# Patient Record
Sex: Female | Born: 1940 | Race: Black or African American | Hispanic: No | Smoking: Former smoker
Health system: Southern US, Community
[De-identification: ages and names within clinical notes are randomized; demographics above are authoritative.]

## PROBLEM LIST (undated history)

## (undated) DIAGNOSIS — I719 Aortic aneurysm of unspecified site, without rupture: Secondary | ICD-10-CM

## (undated) DIAGNOSIS — J189 Pneumonia, unspecified organism: Secondary | ICD-10-CM

## (undated) DIAGNOSIS — I10 Essential (primary) hypertension: Secondary | ICD-10-CM

## (undated) DIAGNOSIS — N1831 Chronic kidney disease, stage 3a: Secondary | ICD-10-CM

## (undated) DIAGNOSIS — D649 Anemia, unspecified: Secondary | ICD-10-CM

## (undated) DIAGNOSIS — I771 Stricture of artery: Secondary | ICD-10-CM

## (undated) DIAGNOSIS — I340 Nonrheumatic mitral (valve) insufficiency: Secondary | ICD-10-CM

## (undated) DIAGNOSIS — Z9289 Personal history of other medical treatment: Secondary | ICD-10-CM

## (undated) DIAGNOSIS — I351 Nonrheumatic aortic (valve) insufficiency: Secondary | ICD-10-CM

## (undated) DIAGNOSIS — E7800 Pure hypercholesterolemia, unspecified: Secondary | ICD-10-CM

## (undated) DIAGNOSIS — I429 Cardiomyopathy, unspecified: Secondary | ICD-10-CM

## (undated) DIAGNOSIS — C50912 Malignant neoplasm of unspecified site of left female breast: Secondary | ICD-10-CM

## (undated) DIAGNOSIS — Z9221 Personal history of antineoplastic chemotherapy: Secondary | ICD-10-CM

## (undated) DIAGNOSIS — E119 Type 2 diabetes mellitus without complications: Secondary | ICD-10-CM

## (undated) DIAGNOSIS — I774 Celiac artery compression syndrome: Secondary | ICD-10-CM

## (undated) HISTORY — PX: TUBAL LIGATION: SHX77

## (undated) HISTORY — DX: Aortic aneurysm of unspecified site, without rupture: I71.9

## (undated) HISTORY — PX: NASAL SINUS SURGERY: SHX719

## (undated) HISTORY — PX: TONSILLECTOMY: SUR1361

## (undated) HISTORY — PX: LUMBAR MICRODISCECTOMY: SHX99

## (undated) HISTORY — PX: BACK SURGERY: SHX140

---

## 2001-05-26 HISTORY — PX: MASTECTOMY: SHX3

## 2001-05-26 HISTORY — PX: BREAST BIOPSY: SHX20

## 2002-05-26 DIAGNOSIS — Z9221 Personal history of antineoplastic chemotherapy: Secondary | ICD-10-CM

## 2002-05-26 HISTORY — DX: Personal history of antineoplastic chemotherapy: Z92.21

## 2010-02-05 ENCOUNTER — Encounter: Admission: RE | Admit: 2010-02-05 | Discharge: 2010-02-05 | Payer: Self-pay | Admitting: Emergency Medicine

## 2010-12-30 ENCOUNTER — Other Ambulatory Visit: Payer: Self-pay | Admitting: Emergency Medicine

## 2010-12-30 DIAGNOSIS — Z1231 Encounter for screening mammogram for malignant neoplasm of breast: Secondary | ICD-10-CM

## 2011-02-10 ENCOUNTER — Ambulatory Visit
Admission: RE | Admit: 2011-02-10 | Discharge: 2011-02-10 | Disposition: A | Discharge status: Home / Self Care | Payer: Medicare Other | Source: Ambulatory Visit | Attending: Emergency Medicine | Admitting: Emergency Medicine

## 2011-02-10 DIAGNOSIS — Z1231 Encounter for screening mammogram for malignant neoplasm of breast: Secondary | ICD-10-CM

## 2011-02-14 ENCOUNTER — Other Ambulatory Visit: Payer: Self-pay | Admitting: Emergency Medicine

## 2011-02-14 DIAGNOSIS — R928 Other abnormal and inconclusive findings on diagnostic imaging of breast: Secondary | ICD-10-CM

## 2011-02-26 ENCOUNTER — Ambulatory Visit
Admission: RE | Admit: 2011-02-26 | Discharge: 2011-02-26 | Disposition: A | Discharge status: Home / Self Care | Payer: Medicare Other | Source: Ambulatory Visit | Attending: Emergency Medicine | Admitting: Emergency Medicine

## 2011-02-26 ENCOUNTER — Ambulatory Visit
Admission: RE | Admit: 2011-02-26 | Discharge: 2011-02-26 | Disposition: A | Discharge status: Home / Self Care | Payer: PRIVATE HEALTH INSURANCE | Source: Ambulatory Visit | Attending: Emergency Medicine | Admitting: Emergency Medicine

## 2011-02-26 DIAGNOSIS — R928 Other abnormal and inconclusive findings on diagnostic imaging of breast: Secondary | ICD-10-CM

## 2012-01-21 ENCOUNTER — Other Ambulatory Visit: Payer: Self-pay | Admitting: Family Medicine

## 2012-01-21 DIAGNOSIS — Z1231 Encounter for screening mammogram for malignant neoplasm of breast: Secondary | ICD-10-CM

## 2012-02-16 ENCOUNTER — Other Ambulatory Visit: Payer: Self-pay | Admitting: Family Medicine

## 2012-02-16 ENCOUNTER — Ambulatory Visit: Payer: Medicare Other

## 2012-02-16 ENCOUNTER — Ambulatory Visit
Admission: RE | Admit: 2012-02-16 | Discharge: 2012-02-16 | Disposition: A | Discharge status: Home / Self Care | Payer: Medicare Other | Source: Ambulatory Visit | Attending: Family Medicine | Admitting: Family Medicine

## 2012-02-16 DIAGNOSIS — Z1231 Encounter for screening mammogram for malignant neoplasm of breast: Secondary | ICD-10-CM

## 2013-01-26 ENCOUNTER — Other Ambulatory Visit: Payer: Self-pay

## 2013-01-26 ENCOUNTER — Other Ambulatory Visit: Payer: Self-pay | Admitting: *Deleted

## 2013-01-26 DIAGNOSIS — Z9012 Acquired absence of left breast and nipple: Secondary | ICD-10-CM

## 2013-01-26 DIAGNOSIS — N951 Menopausal and female climacteric states: Secondary | ICD-10-CM

## 2013-01-26 DIAGNOSIS — Z853 Personal history of malignant neoplasm of breast: Secondary | ICD-10-CM

## 2013-01-26 DIAGNOSIS — Z1231 Encounter for screening mammogram for malignant neoplasm of breast: Secondary | ICD-10-CM

## 2013-02-18 ENCOUNTER — Ambulatory Visit: Payer: Medicare Other

## 2013-02-18 ENCOUNTER — Other Ambulatory Visit: Payer: Medicare Other

## 2013-02-23 ENCOUNTER — Ambulatory Visit
Admission: RE | Admit: 2013-02-23 | Discharge: 2013-02-23 | Disposition: A | Discharge status: Home / Self Care | Payer: Medicare Other | Source: Ambulatory Visit | Attending: *Deleted | Admitting: *Deleted

## 2013-02-23 ENCOUNTER — Ambulatory Visit
Admission: RE | Admit: 2013-02-23 | Discharge: 2013-02-23 | Disposition: A | Discharge status: Home / Self Care | Payer: Medicare Other | Source: Ambulatory Visit

## 2013-02-23 DIAGNOSIS — N951 Menopausal and female climacteric states: Secondary | ICD-10-CM

## 2013-02-23 DIAGNOSIS — Z853 Personal history of malignant neoplasm of breast: Secondary | ICD-10-CM

## 2013-02-23 DIAGNOSIS — Z1231 Encounter for screening mammogram for malignant neoplasm of breast: Secondary | ICD-10-CM

## 2013-02-23 DIAGNOSIS — Z9012 Acquired absence of left breast and nipple: Secondary | ICD-10-CM

## 2014-01-13 ENCOUNTER — Other Ambulatory Visit: Payer: Self-pay

## 2014-01-13 DIAGNOSIS — Z1231 Encounter for screening mammogram for malignant neoplasm of breast: Secondary | ICD-10-CM

## 2014-01-13 DIAGNOSIS — Z9012 Acquired absence of left breast and nipple: Secondary | ICD-10-CM

## 2014-02-27 ENCOUNTER — Ambulatory Visit
Admission: RE | Admit: 2014-02-27 | Discharge: 2014-02-27 | Disposition: A | Discharge status: Home / Self Care | Payer: Medicare Other | Source: Ambulatory Visit

## 2014-02-27 DIAGNOSIS — Z1231 Encounter for screening mammogram for malignant neoplasm of breast: Secondary | ICD-10-CM

## 2014-02-27 DIAGNOSIS — Z9012 Acquired absence of left breast and nipple: Secondary | ICD-10-CM

## 2014-11-25 ENCOUNTER — Emergency Department (HOSPITAL_COMMUNITY)
Admission: EM | Admit: 2014-11-25 | Discharge: 2014-11-25 | Disposition: A | Discharge status: Home / Self Care | Payer: Medicare Other | Attending: Emergency Medicine | Admitting: Emergency Medicine

## 2014-11-25 ENCOUNTER — Emergency Department (HOSPITAL_COMMUNITY): Payer: Medicare Other

## 2014-11-25 ENCOUNTER — Encounter (HOSPITAL_COMMUNITY): Payer: Self-pay | Admitting: Emergency Medicine

## 2014-11-25 ENCOUNTER — Telehealth: Payer: Self-pay | Admitting: *Deleted

## 2014-11-25 DIAGNOSIS — Z79899 Other long term (current) drug therapy: Secondary | ICD-10-CM | POA: Insufficient documentation

## 2014-11-25 DIAGNOSIS — D649 Anemia, unspecified: Secondary | ICD-10-CM | POA: Insufficient documentation

## 2014-11-25 DIAGNOSIS — I10 Essential (primary) hypertension: Secondary | ICD-10-CM | POA: Diagnosis not present

## 2014-11-25 DIAGNOSIS — M546 Pain in thoracic spine: Secondary | ICD-10-CM | POA: Diagnosis present

## 2014-11-25 DIAGNOSIS — R109 Unspecified abdominal pain: Secondary | ICD-10-CM | POA: Diagnosis not present

## 2014-11-25 DIAGNOSIS — I714 Abdominal aortic aneurysm, without rupture, unspecified: Secondary | ICD-10-CM

## 2014-11-25 DIAGNOSIS — Z87891 Personal history of nicotine dependence: Secondary | ICD-10-CM | POA: Insufficient documentation

## 2014-11-25 DIAGNOSIS — E119 Type 2 diabetes mellitus without complications: Secondary | ICD-10-CM | POA: Insufficient documentation

## 2014-11-25 DIAGNOSIS — M545 Low back pain: Secondary | ICD-10-CM | POA: Insufficient documentation

## 2014-11-25 DIAGNOSIS — R112 Nausea with vomiting, unspecified: Secondary | ICD-10-CM | POA: Insufficient documentation

## 2014-11-25 DIAGNOSIS — Z859 Personal history of malignant neoplasm, unspecified: Secondary | ICD-10-CM | POA: Insufficient documentation

## 2014-11-25 HISTORY — DX: Essential (primary) hypertension: I10

## 2014-11-25 LAB — COMPREHENSIVE METABOLIC PANEL
ALT: 13 U/L — ABNORMAL LOW (ref 14–54)
ANION GAP: 11 (ref 5–15)
AST: 22 U/L (ref 15–41)
Albumin: 3.6 g/dL (ref 3.5–5.0)
Alkaline Phosphatase: 70 U/L (ref 38–126)
BILIRUBIN TOTAL: 0.5 mg/dL (ref 0.3–1.2)
BUN: 20 mg/dL (ref 6–20)
CO2: 26 mmol/L (ref 22–32)
CREATININE: 1.03 mg/dL — AB (ref 0.44–1.00)
Calcium: 8.9 mg/dL (ref 8.9–10.3)
Chloride: 101 mmol/L (ref 101–111)
GFR calc Af Amer: >60 mL/min (ref >60)
GFR, EST NON AFRICAN AMERICAN: 52 mL/min — AB (ref >60)
GLUCOSE: 140 mg/dL — AB (ref 65–99)
POTASSIUM: 3.4 mmol/L — AB (ref 3.5–5.1)
Sodium: 138 mmol/L (ref 135–145)
Total Protein: 7.3 g/dL (ref 6.5–8.1)

## 2014-11-25 LAB — CBC WITH DIFFERENTIAL/PLATELET
BASOS PCT: 0 % (ref 0–1)
Basophils Absolute: 0 10*3/uL (ref 0.0–0.1)
EOS PCT: 0 % (ref 0–5)
Eosinophils Absolute: 0 10*3/uL (ref 0.0–0.7)
HEMATOCRIT: 30.4 % — AB (ref 36.0–46.0)
Hemoglobin: 9.9 g/dL — ABNORMAL LOW (ref 12.0–15.0)
Lymphocytes Relative: 11 % — ABNORMAL LOW (ref 12–46)
Lymphs Abs: 1.1 10*3/uL (ref 0.7–4.0)
MCH: 26.1 pg (ref 26.0–34.0)
MCHC: 32.6 g/dL (ref 30.0–36.0)
MCV: 80.2 fL (ref 78.0–100.0)
MONO ABS: 0.5 10*3/uL (ref 0.1–1.0)
MONOS PCT: 5 % (ref 3–12)
NEUTROS ABS: 8.3 10*3/uL — AB (ref 1.7–7.7)
NEUTROS PCT: 84 % — AB (ref 43–77)
Platelets: 225 10*3/uL (ref 150–400)
RBC: 3.79 MIL/uL — ABNORMAL LOW (ref 3.87–5.11)
RDW: 14.5 % (ref 11.5–15.5)
WBC: 10 10*3/uL (ref 4.0–10.5)

## 2014-11-25 LAB — TROPONIN I: Troponin I: <0.03 ng/mL (ref <0.031)

## 2014-11-25 LAB — I-STAT CG4 LACTIC ACID, ED: Lactic Acid, Venous: 0.96 mmol/L (ref 0.5–2.0)

## 2014-11-25 LAB — LIPASE, BLOOD: Lipase: 19 U/L — ABNORMAL LOW (ref 22–51)

## 2014-11-25 MED ORDER — DIPHENHYDRAMINE HCL 50 MG/ML IJ SOLN
25.0000 mg | Freq: Once | INTRAMUSCULAR | Status: AC
  Administered 2014-11-25: 25 mg via INTRAVENOUS
  Filled 2014-11-25: qty 1

## 2014-11-25 MED ORDER — MORPHINE SULFATE 4 MG/ML IJ SOLN
4.0000 mg | Freq: Once | INTRAMUSCULAR | Status: AC
  Administered 2014-11-25: 4 mg via INTRAVENOUS
  Filled 2014-11-25: qty 1

## 2014-11-25 MED ORDER — ONDANSETRON HCL 4 MG PO TABS: 4.0000 mg | ORAL_TABLET | Freq: Four times a day (QID) | ORAL | Status: DC | PRN

## 2014-11-25 MED ORDER — ONDANSETRON HCL 4 MG/2ML IJ SOLN
4.0000 mg | Freq: Once | INTRAMUSCULAR | Status: AC
  Administered 2014-11-25: 4 mg via INTRAVENOUS
  Filled 2014-11-25: qty 2

## 2014-11-25 MED ORDER — IOHEXOL 300 MG/ML  SOLN: 25.0000 mL | Freq: Once | INTRAMUSCULAR | Status: DC | PRN

## 2014-11-25 MED ORDER — METOCLOPRAMIDE HCL 5 MG/ML IJ SOLN
10.0000 mg | Freq: Once | INTRAMUSCULAR | Status: AC
  Administered 2014-11-25: 10 mg via INTRAVENOUS
  Filled 2014-11-25: qty 2

## 2014-11-25 MED ORDER — OXYCODONE-ACETAMINOPHEN 5-325 MG PO TABS: 1.0000 | ORAL_TABLET | ORAL | Status: DC | PRN

## 2014-11-25 MED ORDER — IOHEXOL 300 MG/ML  SOLN
100.0000 mL | Freq: Once | INTRAMUSCULAR | Status: AC | PRN
  Administered 2014-11-25: 100 mL via INTRAVENOUS

## 2014-11-25 NOTE — Discharge Instructions (Signed)
Return if symptoms are getting worse.  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 This needs to be watched closely. If it gets bigger than 55 mm, then the risk of it rupturing gets much higher.  Abdominal Pain Many things can cause abdominal pain. Usually, abdominal pain is not caused by a disease and will improve without treatment. It can often be observed and treated at home. Your health care provider will do a physical exam and possibly order blood tests and X-rays to help determine the seriousness of your pain. However, in many cases, more time must pass before a clear cause of the pain can be found. Before that point, your health care provider may not know if you need more testing or further treatment. HOME CARE INSTRUCTIONS  Monitor your abdominal pain for any changes. The following actions may help to alleviate any discomfort you are experiencing:  Only take over-the-counter or prescription medicines as directed by your health care provider.  Do not take laxatives unless directed to do so by your health care provider.  Try a clear liquid diet (broth, tea, or water) as directed by your health care provider. Slowly move to a bland diet as tolerated. SEEK MEDICAL CARE IF:  You have unexplained abdominal pain.  You have abdominal pain associated with nausea or diarrhea.  You have pain when you urinate or have a bowel movement.  You experience abdominal pain that wakes you in the night.  You have abdominal pain that is worsened or improved by eating food.  You have abdominal pain that is worsened with eating fatty foods.  You have a fever. SEEK IMMEDIATE MEDICAL CARE IF:   Your pain does not go away within 2 hours.  You keep throwing up (vomiting).  Your pain is felt only in portions of the abdomen, such as the right side or the left lower portion of the abdomen.  You pass bloody or black tarry stools. MAKE SURE YOU:  Understand these instructions.   Will watch your  condition.   Will get help right away if you are not doing well or get worse.  Document Released: 02/19/2005 Document Revised: 05/17/2013 Document Reviewed: 01/19/2013 [facility] Patient Information 2015 [place], [place]. This information is not intended to replace advice given to you by your health care provider. Make sure you discuss any questions you have with your health care provider.   Abdominal Aortic Aneurysm An aneurysm is a weakened or damaged part of an artery wall that bulges from the normal force of blood pumping through the body. An abdominal aortic aneurysm is an aneurysm that occurs in the lower part of the aorta, the main artery of the body.  The major concern with an abdominal aortic aneurysm is that it can enlarge and burst (rupture) or blood can flow between the layers of the wall of the aorta through a tear (aorticdissection). Both of these conditions can cause bleeding inside the body and can be life threatening unless diagnosed and treated promptly. CAUSES  The exact cause of an abdominal aortic aneurysm is unknown. Some contributing factors are:   A hardening of the arteries caused by the buildup of fat and other substances in the lining of a blood vessel (arteriosclerosis).  Inflammation of the walls of an artery (arteritis).   Connective tissue diseases, such as Marfan syndrome.   Abdominal trauma.   An infection, such as syphilis or staphylococcus, in the wall of the aorta (infectious aortitis) caused by bacteria. RISK FACTORS  Risk factors that contribute to an abdominal aortic aneurysm may include:  Age older than 61 years.   High blood pressure (hypertension).  Female gender.  Ethnicity (white race).  Obesity.  Family history of aneurysm (first degree relatives only).  Tobacco use. PREVENTION  The following healthy lifestyle habits may help decrease your risk of abdominal aortic aneurysm:  Quitting smoking. Smoking can raise your blood pressure  and cause arteriosclerosis.  Limiting or avoiding alcohol.  Keeping your blood pressure, blood sugar level, and cholesterol levels within normal limits.  Decreasing your salt intake. In somepeople, too much salt can raise blood pressure and increase your risk of abdominal aortic aneurysm.  Eating a diet low in saturated fats and cholesterol.  Increasing your fiber intake by including whole grains, vegetables, and fruits in your diet. Eating these foods may help lower blood pressure.  Maintaining a healthy weight.  Staying physically active and exercising regularly. SYMPTOMS  The symptoms of abdominal aortic aneurysm may vary depending on the size and rate of growth of the aneurysm.Most grow slowly and do not have any symptoms. When symptoms do occur, they may include:  Pain (abdomen, side, lower back, or groin). The pain may vary in intensity. A sudden onset of severe pain may indicate that the aneurysm has ruptured.  Feeling full after eating only small amounts of food.  Nausea or vomiting or both.  Feeling a pulsating lump in the abdomen.  Feeling faint or passing out. DIAGNOSIS  Since most unruptured abdominal aortic aneurysms have no symptoms, they are often discovered during diagnostic exams for other conditions. An aneurysm may be found during the following procedures:  Ultrasonography (A one-time screening for abdominal aortic aneurysm by ultrasonography is also recommended for all men aged 66-75 years who have ever smoked).  X-ray exams.  A computed tomography (CT).  Magnetic resonance imaging (MRI).  Angiography or arteriography. TREATMENT  Treatment of an abdominal aortic aneurysm depends on the size of your aneurysm, your age, and risk factors for rupture. Medication to control blood pressure and pain may be used to manage aneurysms smaller than 6 cm. Regular monitoring for enlargement may be recommended by your caregiver if:  The aneurysm is 3-4 cm in size (an  annual ultrasonography may be recommended).  The aneurysm is 4-4.5 cm in size (an ultrasonography every 6 months may be recommended).  The aneurysm is larger than 4.5 cm in size (your caregiver may ask that you be examined by a vascular surgeon). If your aneurysm is larger than 6 cm, surgical repair may be recommended. There are two main methods for repair of an aneurysm:   Endovascular repair (a minimally invasive surgery). This is done most often.  Open repair. This method is used if an endovascular repair is not possible. Document Released: 02/19/2005 Document Revised: 09/06/2012 Document Reviewed: 06/11/2012 [facility] Patient Information 2015 [place], [place]. This information is not intended to replace advice given to you by your health care provider. Make sure you discuss any questions you have with your health care provider.  Acetaminophen; Oxycodone tablets What is this medicine? ACETAMINOPHEN; OXYCODONE (a set a MEE noe fen; ox i KOE done) is a pain reliever. It is used to treat mild to moderate pain. This medicine may be used for other purposes; ask your health care provider or pharmacist if you have questions. COMMON BRAND NAME(S): Endocet, Magnacet, Narvox, Percocet, Perloxx, Primalev, Primlev, Roxicet, Xolox What should I tell my health care provider before I take this medicine? They need to  know if you have any of these conditions: -brain tumor -Crohn's disease, inflammatory bowel disease, or ulcerative colitis -drug abuse or addiction -head injury -heart or circulation problems -if you often drink alcohol -kidney disease or problems going to the bathroom -liver disease -lung disease, asthma, or breathing problems -an unusual or allergic reaction to acetaminophen, oxycodone, other opioid analgesics, other medicines, foods, dyes, or preservatives -pregnant or trying to get pregnant -breast-feeding How should I use this medicine? Take this medicine by mouth with a full  glass of water. Follow the directions on the prescription label. Take your medicine at regular intervals. Do not take your medicine more often than directed. Talk to your pediatrician regarding the use of this medicine in children. Special care may be needed. Patients over 13 years old may have a stronger reaction and need a smaller dose. Overdosage: If you think you have taken too much of this medicine contact a poison control center or emergency room at once. NOTE: This medicine is only for you. Do not share this medicine with others. What if I miss a dose? If you miss a dose, take it as soon as you can. If it is almost time for your next dose, take only that dose. Do not take double or extra doses. What may interact with this medicine? -alcohol -antihistamines -barbiturates like amobarbital, butalbital, butabarbital, methohexital, pentobarbital, phenobarbital, thiopental, and secobarbital -benztropine -drugs for bladder problems like solifenacin, trospium, oxybutynin, tolterodine, hyoscyamine, and methscopolamine -drugs for breathing problems like ipratropium and tiotropium -drugs for certain stomach or intestine problems like propantheline, homatropine methylbromide, glycopyrrolate, atropine, belladonna, and dicyclomine -general anesthetics like etomidate, ketamine, nitrous oxide, propofol, desflurane, enflurane, halothane, isoflurane, and sevoflurane -medicines for depression, anxiety, or psychotic disturbances -medicines for sleep -muscle relaxants -naltrexone -narcotic medicines (opiates) for pain -phenothiazines like perphenazine, thioridazine, chlorpromazine, mesoridazine, fluphenazine, prochlorperazine, promazine, and trifluoperazine -scopolamine -tramadol -trihexyphenidyl This list may not describe all possible interactions. Give your health care provider a list of all the medicines, herbs, non-prescription drugs, or dietary supplements you use. Also tell them if you smoke, drink  alcohol, or use illegal drugs. Some items may interact with your medicine. What should I watch for while using this medicine? Tell your doctor or health care professional if your pain does not go away, if it gets worse, or if you have new or a different type of pain. You may develop tolerance to the medicine. Tolerance means that you will need a higher dose of the medication for pain relief. Tolerance is normal and is expected if you take this medicine for a long time. Do not suddenly stop taking your medicine because you may develop a severe reaction. Your body becomes used to the medicine. This does NOT mean you are addicted. Addiction is a behavior related to getting and using a drug for a non-medical reason. If you have pain, you have a medical reason to take pain medicine. Your doctor will tell you how much medicine to take. If your doctor wants you to stop the medicine, the dose will be slowly lowered over time to avoid any side effects. You may get drowsy or dizzy. Do not drive, use machinery, or do anything that needs mental alertness until you know how this medicine affects you. Do not stand or sit up quickly, especially if you are an older patient. This reduces the risk of dizzy or fainting spells. Alcohol may interfere with the effect of this medicine. Avoid alcoholic drinks. There are different types of narcotic medicines (opiates)  for pain. If you take more than one type at the same time, you may have more side effects. Give your health care provider a list of all medicines you use. Your doctor will tell you how much medicine to take. Do not take more medicine than directed. Call emergency for help if you have problems breathing. The medicine will cause constipation. Try to have a bowel movement at least every 2 to 3 days. If you do not have a bowel movement for 3 days, call your doctor or health care professional. Do not take Tylenol (acetaminophen) or medicines that have acetaminophen with this  medicine. Too much acetaminophen can be very dangerous. Many nonprescription medicines contain acetaminophen. Always read the labels carefully to avoid taking more acetaminophen. What side effects may I notice from receiving this medicine? Side effects that you should report to your doctor or health care professional as soon as possible: -allergic reactions like skin rash, itching or hives, swelling of the face, lips, or tongue -breathing difficulties, wheezing -confusion -light headedness or fainting spells -severe stomach pain -unusually weak or tired -yellowing of the skin or the whites of the eyes Side effects that usually do not require medical attention (report to your doctor or health care professional if they continue or are bothersome): -dizziness -drowsiness -nausea -vomiting This list may not describe all possible side effects. Call your doctor for medical advice about side effects. You may report side effects to FDA at 1-800-FDA-1088. Where should I keep my medicine? Keep out of the reach of children. This medicine can be abused. Keep your medicine in a safe place to protect it from theft. Do not share this medicine with anyone. Selling or giving away this medicine is dangerous and against the law. Store at room temperature between 20 and 25 degrees C (68 and 77 degrees F). Keep container tightly closed. Protect from light. This medicine may cause accidental overdose and death if it is taken by other adults, children, or pets. Flush any unused medicine down the toilet to reduce the chance of harm. Do not use the medicine after the expiration date. NOTE: This sheet is a summary. It may not cover all possible information. If you have questions about this medicine, talk to your doctor, pharmacist, or health care provider.  2015, [employer]. (2013-01-03 13:17:35)  Ondansetron tablets What is this medicine? ONDANSETRON (on DAN se tron) is used to treat nausea and vomiting  caused by chemotherapy. It is also used to prevent or treat nausea and vomiting after surgery. This medicine may be used for other purposes; ask your health care provider or pharmacist if you have questions. COMMON BRAND NAME(S): Zofran What should I tell my health care provider before I take this medicine? They need to know if you have any of these conditions: -heart disease -history of irregular heartbeat -liver disease -low levels of magnesium or potassium in the blood -an unusual or allergic reaction to ondansetron, granisetron, other medicines, foods, dyes, or preservatives -pregnant or trying to get pregnant -breast-feeding How should I use this medicine? Take this medicine by mouth with a glass of water. Follow the directions on your prescription label. Take your doses at regular intervals. Do not take your medicine more often than directed. Talk to your pediatrician regarding the use of this medicine in children. Special care may be needed. Overdosage: If you think you have taken too much of this medicine contact a poison control center or emergency room at once. NOTE: This medicine is only  for you. Do not share this medicine with others. What if I miss a dose? If you miss a dose, take it as soon as you can. If it is almost time for your next dose, take only that dose. Do not take double or extra doses. What may interact with this medicine? Do not take this medicine with any of the following medications: -apomorphine -certain medicines for fungal infections like fluconazole, itraconazole, ketoconazole, posaconazole, voriconazole -cisapride -dofetilide -dronedarone -pimozide -thioridazine -ziprasidone This medicine may also interact with the following medications: -carbamazepine -certain medicines for depression, anxiety, or psychotic disturbances -fentanyl -linezolid -MAOIs like Carbex, Eldepryl, Marplan, Nardil, and Parnate -methylene blue (injected into a vein) -other  medicines that prolong the QT interval (cause an abnormal heart rhythm) -phenytoin -rifampicin -tramadol This list may not describe all possible interactions. Give your health care provider a list of all the medicines, herbs, non-prescription drugs, or dietary supplements you use. Also tell them if you smoke, drink alcohol, or use illegal drugs. Some items may interact with your medicine. What should I watch for while using this medicine? Check with your doctor or health care professional right away if you have any sign of an allergic reaction. What side effects may I notice from receiving this medicine? Side effects that you should report to your doctor or health care professional as soon as possible: -allergic reactions like skin rash, itching or hives, swelling of the face, lips or tongue -breathing problems -confusion -dizziness -fast or irregular heartbeat -feeling faint or lightheaded, falls -fever and chills -loss of balance or coordination -seizures -sweating -swelling of the hands or feet -tightness in the chest -tremors -unusually weak or tired Side effects that usually do not require medical attention (report to your doctor or health care professional if they continue or are bothersome): -constipation or diarrhea -headache This list may not describe all possible side effects. Call your doctor for medical advice about side effects. You may report side effects to FDA at 1-800-FDA-1088. Where should I keep my medicine? Keep out of the reach of children. Store between 2 and 30 degrees C (36 and 86 degrees F). Throw away any unused medicine after the expiration date. NOTE: This sheet is a summary. It may not cover all possible information. If you have questions about this medicine, talk to your doctor, pharmacist, or health care provider.  2015, [employer]. (2013-02-16 16:27:45)

## 2014-11-25 NOTE — ED Provider Notes (Signed)
CSN: [id number]     Arrival date & time 11/25/14  0110 History  This chart was scribed for Delora Fuel, MD by Peyton Bottoms, ED Scribe. This patient was seen in room B19C/B19C and the patient's care was started at 1:41 AM.   Chief Complaint  Patient presents with  . Back Pain   Patient is a 74 y.o. female presenting with back pain. The history is provided by the patient. No language interpreter was used.  Back Pain  HPI Comments: Brittany Greene is a 74 y.o. female with a PMHx of diabetes, hypertension, and cancer, brought in by ambulance, who presents to the Emergency Department complaining of onest of sharp upper back pain at rest earlier today at 10PM. She states that the pain radiates around to chest area. She rates pain at 10/10 and states the pain has decreased since given pain medication upon arrival. She reports associated diaphoresis, nausea and 1 episode of emesis. She denies prior hx of similar pain in the past. Pt is a non smoker and occasionally drinks. Pt is seen by PCP Dr. Everardo Beals. Pt has NKDA.  Past Medical History  Diagnosis Date  . Cancer   . Diabetes mellitus without complication   . Hypertension    Past Surgical History  Procedure Laterality Date  . Breast surgery     History reviewed. No pertinent family history. History  Substance Use Topics  . Smoking status: Former Smoker    Quit date: 11/25/2002  . Smokeless tobacco: Not on file  . Alcohol Use: Yes     Comment: "not very often"   OB History    No data available     Review of Systems  Constitutional: Positive for diaphoresis.  Gastrointestinal: Positive for nausea and vomiting.  Musculoskeletal: Positive for back pain.  All other systems reviewed and are negative.  Allergies  Review of patient's allergies indicates no known allergies.  Home Medications   Prior to Admission medications   Medication Sig Start Date End Date Taking? Authorizing Provider  lisinopril (PRINIVIL,ZESTRIL) 40 MG  tablet Take 40 mg by mouth daily.   Yes Historical Provider, MD  metFORMIN (GLUCOPHAGE-XR) 500 MG 24 hr tablet Take 1,000 mg by mouth daily with breakfast.   Yes Historical Provider, MD  NIFEdipine (PROCARDIA-XL/ADALAT CC) 30 MG 24 hr tablet Take 30 mg by mouth daily.   Yes Historical Provider, MD  simvastatin (ZOCOR) 20 MG tablet Take 20 mg by mouth daily.   Yes Historical Provider, MD   Triage Vitals: BP 130/76 mmHg  Pulse 50  Temp(Src) 97.7 F (36.5 C) (Oral)  Resp 12  SpO2 99%  Physical Exam  Constitutional: She is oriented to person, place, and time. She appears well-developed and well-nourished. No distress.  HENT:  Head: Normocephalic and atraumatic.  Eyes: Conjunctivae and EOM are normal. Pupils are equal, round, and reactive to light. No scleral icterus.  Neck: Normal range of motion. Neck supple. No JVD present.  Cardiovascular: Normal rate, regular rhythm and normal heart sounds.   No murmur heard. Pulmonary/Chest: Effort normal and breath sounds normal. She has no wheezes. She has no rales. She exhibits no tenderness.  Abdominal: Soft. She exhibits no mass.  Mild tenderness across upper abdomen. No rebound or guarding. Bowel sounds decreased.  Musculoskeletal: Normal range of motion. She exhibits no edema.  Mild tenderness across upper lumbar area.  Lymphadenopathy:    She has no cervical adenopathy.  Neurological: She is alert and oriented to person, place, and time. No  cranial nerve deficit. She exhibits normal muscle tone. Coordination normal.  Skin: Skin is warm and dry. No rash noted.  Psychiatric: She has a normal mood and affect. Her behavior is normal. Judgment and thought content normal.  Nursing note and vitals reviewed.  ED Course  Procedures (including critical care time)  DIAGNOSTIC STUDIES: Oxygen Saturation is 99% on [place], normal by my interpretation.    COORDINATION OF CARE: 1:45 AM- Discussed plans to order diagnostic CT of abdomen and pelvis, EKG and  lab work. Will give pt Zofran and morphine. Pt advised of plan for treatment and pt agrees.  Labs Review Results for orders placed or performed during the hospital encounter of 11/25/14  CBC with Differential  Result Value Ref Range   WBC 10.0 4.0 - 10.5 K/uL   RBC 3.79 (L) 3.87 - 5.11 MIL/uL   Hemoglobin 9.9 (L) 12.0 - 15.0 g/dL   HCT 30.4 (L) 36.0 - 46.0 %   MCV 80.2 78.0 - 100.0 fL   MCH 26.1 26.0 - 34.0 pg   MCHC 32.6 30.0 - 36.0 g/dL   RDW 14.5 11.5 - 15.5 %   Platelets 225 150 - 400 K/uL   Neutrophils Relative % 84 (H) 43 - 77 %   Neutro Abs 8.3 (H) 1.7 - 7.7 K/uL   Lymphocytes Relative 11 (L) 12 - 46 %   Lymphs Abs 1.1 0.7 - 4.0 K/uL   Monocytes Relative 5 3 - 12 %   Monocytes Absolute 0.5 0.1 - 1.0 K/uL   Eosinophils Relative 0 0 - 5 %   Eosinophils Absolute 0.0 0.0 - 0.7 K/uL   Basophils Relative 0 0 - 1 %   Basophils Absolute 0.0 0.0 - 0.1 K/uL  Comprehensive metabolic panel  Result Value Ref Range   Sodium 138 135 - 145 mmol/L   Potassium 3.4 (L) 3.5 - 5.1 mmol/L   Chloride 101 101 - 111 mmol/L   CO2 26 22 - 32 mmol/L   Glucose, Bld 140 (H) 65 - 99 mg/dL   BUN 20 6 - 20 mg/dL   Creatinine, Ser 1.03 (H) 0.44 - 1.00 mg/dL   Calcium 8.9 8.9 - 10.3 mg/dL   Total Protein 7.3 6.5 - 8.1 g/dL   Albumin 3.6 3.5 - 5.0 g/dL   AST 22 15 - 41 U/L   ALT 13 (L) 14 - 54 U/L   Alkaline Phosphatase 70 38 - 126 U/L   Total Bilirubin 0.5 0.3 - 1.2 mg/dL   GFR calc non Af Amer 52 (L) >60 mL/min   GFR calc Af Amer >60 >60 mL/min   Anion gap 11 5 - 15  Lipase, blood  Result Value Ref Range   Lipase 19 (L) 22 - 51 U/L  Troponin I  Result Value Ref Range   Troponin I <0.03 <0.031 ng/mL  I-Stat CG4 Lactic Acid, ED  Result Value Ref Range   Lactic Acid, Venous 0.96 0.5 - 2.0 mmol/L    Imaging Review Ct Abdomen Pelvis W Contrast  11/25/2014   CLINICAL DATA:  Epigastric pain for 1 day.  Back pain and nausea.  EXAM: CT ABDOMEN AND PELVIS WITH CONTRAST  TECHNIQUE: Multidetector CT  imaging of the abdomen and pelvis was performed using the standard protocol following bolus administration of intravenous contrast.  CONTRAST:  160mL OMNIPAQUE IOHEXOL 300 MG/ML  SOLN  COMPARISON:  None.  FINDINGS: Lower chest: Minimal ground-glass and linear opacities are present in the lung bases, possibly atelectatic. No masses or  nodules are evident.  Hepatobiliary: There are normal appearances of the liver, gallbladder and bile ducts.  Pancreas: Unremarkable  Spleen: Normal  Adrenals/Urinary Tract: The adrenals and kidneys are normal in appearance. There is no urinary calculus evident. There is no hydronephrosis or ureteral dilatation. Collecting systems and ureters appear unremarkable.  Stomach/Bowel: The stomach and small bowel appear normal. The appendix is normal. There is moderate colonic diverticulosis. The colon is otherwise unremarkable.  Vascular/Lymphatic: There is extensive aneurysmal dilatation of the aorta. The aortic root measures 4.4 cm. The sinotubal junction measures 4.1 cm. The proximal ascending aorta measures 4.8 cm. The arch is out of the field of view. The mid descending colon measures 4.9 cm. The aorta at the diaphragmatic hiatus measures 4.4 cm. The aorta just above the renal arteries measures 4.0 cm. The aorta just above the bifurcation measures 3.4 cm. 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 There is no evidence of dissection or aneurysm rupture. 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 The other major aortic branch vessels are patent.  There is no adenopathy in the abdomen or pelvis.  Reproductive: There are normal appearances of the uterus and ovaries.  Other: There are no acute inflammatory changes in the abdomen or pelvis. There is no ascites.  There is an incompletely imaged left breast prosthesis with evidence of intracapsular rupture of the prosthesis.  Musculoskeletal: No significant  musculoskeletal lesions are evident. There are moderately severe degenerative facet changes, greatest on the left at L4-5.  IMPRESSION: 1. No acute findings are evident in the abdomen or pelvis. 2. Diverticulosis. 3. Extensive aneurysmal dilatation of the aorta. No evidence of aortic aneurysm rupture or dissection. 4. 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 5. Moderately severe lumbar facet arthritis, greatest on the left at L4-5   Electronically Signed   By: Andreas Newport M.D.   On: 11/25/2014 03:50   EKG Interpretation   Date/Time:  Saturday November 25 2014 01:32:39 EDT Ventricular Rate:  51 PR Interval:  241 QRS Duration: 93 QT Interval:  504 QTC Calculation: 464 R Axis:   -20 Text Interpretation:  Possible wandering atrial pacemaker Prolonged PR  interval LVH with secondary repolarization abnormality Baseline wander in  lead(s) V4 No old tracing to compare Confirmed by [facility]  MD, Joelee Snoke ([id number])  on 11/25/2014 1:37:11 AM     MDM   Final diagnoses:  Abdominal pain, unspecified abdominal location  Abdominal aortic aneurysm greater than 39 mm in diameter  Normochromic normocytic anemia    Upper abdominal pain of uncertain cause. She will be sent for CT of abdomen and pelvis. Screening labs are obtained.  Holter workup shows normochromic anemia. Patient states a history of anemia and it sounds as if her hemoglobin is at about her baseline. Lactic acid level is normal. CT scan shows an abdominal aortic aneurysm which is 49 mm in its greatest diameter. There is no evidence of dissection or rupture, said this is not likely to be the cause of her pain. Information regarding the aneurysm was explained to the patient including need for close follow-up. She had good relief of pain with IV fluids and morphine for pain and metoclopramide for nausea. She is discharged with prescriptions for ondansetron and oxycodone-acetaminophen. She is referred back to her  PCP as well as vascular surgery. Return to the ED if symptoms get worse.  I personally performed the services described in  this documentation, which was scribed in my presence. The recorded information has been reviewed and is accurate.    Delora Fuel, MD 69/43/70 [id number]

## 2014-11-25 NOTE — ED Notes (Addendum)
Pt O2 stats were dropping to the high 80's, placed on 2L of O2, stats raised to 95%.  Will continue to monitor.

## 2014-11-25 NOTE — ED Notes (Signed)
Pt to CT

## 2014-11-25 NOTE — ED Notes (Signed)
Dr. Glick at bedside.  

## 2014-11-25 NOTE — Telephone Encounter (Signed)
Received call from [facility] that "Zofran is not covered by Medicare Part D". Reviewed ED visit with Dalia Heading, PA-C who changed Zofran to Phenergan 25 mg tabs 1 q6h prn Nausea. #10.

## 2014-11-25 NOTE — ED Notes (Signed)
Family requesting update on results; Dr.Glick aware

## 2014-11-25 NOTE — ED Notes (Signed)
Per EMS pt was picked up from home w/ c/o back/abdominal pain, vitals stable upon transport, given 4mg  zofran and 150mg  of fentenal with improvement in nausea and pain.  Pt reports she was placed on iron pills and has become constipated, while trying to use the bathroom she began to have intense back pain that radiated to the front upper quadrant.

## 2014-11-30 ENCOUNTER — Encounter (HOSPITAL_COMMUNITY): Payer: Self-pay | Admitting: Emergency Medicine

## 2014-11-30 ENCOUNTER — Emergency Department (HOSPITAL_COMMUNITY): Payer: Medicare Other

## 2014-11-30 ENCOUNTER — Emergency Department (HOSPITAL_COMMUNITY)
Admission: EM | Admit: 2014-11-30 | Discharge: 2014-12-01 | Disposition: A | Discharge status: Other Acute Inpatient Hospital | Payer: Medicare Other | Attending: Emergency Medicine | Admitting: Emergency Medicine

## 2014-11-30 DIAGNOSIS — Z87891 Personal history of nicotine dependence: Secondary | ICD-10-CM | POA: Diagnosis not present

## 2014-11-30 DIAGNOSIS — R112 Nausea with vomiting, unspecified: Secondary | ICD-10-CM | POA: Insufficient documentation

## 2014-11-30 DIAGNOSIS — I10 Essential (primary) hypertension: Secondary | ICD-10-CM | POA: Diagnosis not present

## 2014-11-30 DIAGNOSIS — Z859 Personal history of malignant neoplasm, unspecified: Secondary | ICD-10-CM | POA: Diagnosis not present

## 2014-11-30 DIAGNOSIS — I7101 Dissection of thoracic aorta: Secondary | ICD-10-CM | POA: Insufficient documentation

## 2014-11-30 DIAGNOSIS — Z79899 Other long term (current) drug therapy: Secondary | ICD-10-CM | POA: Insufficient documentation

## 2014-11-30 DIAGNOSIS — E119 Type 2 diabetes mellitus without complications: Secondary | ICD-10-CM | POA: Diagnosis not present

## 2014-11-30 DIAGNOSIS — M546 Pain in thoracic spine: Secondary | ICD-10-CM | POA: Diagnosis present

## 2014-11-30 DIAGNOSIS — I712 Thoracic aortic aneurysm, without rupture, unspecified: Secondary | ICD-10-CM

## 2014-11-30 LAB — COMPREHENSIVE METABOLIC PANEL
ALT: 17 U/L (ref 14–54)
AST: 24 U/L (ref 15–41)
Albumin: 3.4 g/dL — ABNORMAL LOW (ref 3.5–5.0)
Alkaline Phosphatase: 60 U/L (ref 38–126)
Anion gap: 13 (ref 5–15)
BUN: 18 mg/dL (ref 6–20)
CO2: 24 mmol/L (ref 22–32)
Calcium: 9.2 mg/dL (ref 8.9–10.3)
Chloride: 101 mmol/L (ref 101–111)
Creatinine, Ser: 1.3 mg/dL — ABNORMAL HIGH (ref 0.44–1.00)
GFR calc Af Amer: 46 mL/min — ABNORMAL LOW (ref >60)
GFR calc non Af Amer: 39 mL/min — ABNORMAL LOW (ref >60)
GLUCOSE: 154 mg/dL — AB (ref 65–99)
Potassium: 3 mmol/L — ABNORMAL LOW (ref 3.5–5.1)
SODIUM: 138 mmol/L (ref 135–145)
Total Bilirubin: 0.6 mg/dL (ref 0.3–1.2)
Total Protein: 7.5 g/dL (ref 6.5–8.1)

## 2014-11-30 LAB — CBC WITH DIFFERENTIAL/PLATELET
BASOS PCT: 0 % (ref 0–1)
Basophils Absolute: 0 10*3/uL (ref 0.0–0.1)
Eosinophils Absolute: 0.1 10*3/uL (ref 0.0–0.7)
Eosinophils Relative: 1 % (ref 0–5)
HCT: 29.9 % — ABNORMAL LOW (ref 36.0–46.0)
HEMOGLOBIN: 9.7 g/dL — AB (ref 12.0–15.0)
Lymphocytes Relative: 14 % (ref 12–46)
Lymphs Abs: 1.5 10*3/uL (ref 0.7–4.0)
MCH: 25.3 pg — ABNORMAL LOW (ref 26.0–34.0)
MCHC: 32.4 g/dL (ref 30.0–36.0)
MCV: 78.1 fL (ref 78.0–100.0)
Monocytes Absolute: 0.6 10*3/uL (ref 0.1–1.0)
Monocytes Relative: 6 % (ref 3–12)
NEUTROS PCT: 79 % — AB (ref 43–77)
Neutro Abs: 8.1 10*3/uL — ABNORMAL HIGH (ref 1.7–7.7)
Platelets: 253 10*3/uL (ref 150–400)
RBC: 3.83 MIL/uL — ABNORMAL LOW (ref 3.87–5.11)
RDW: 13.6 % (ref 11.5–15.5)
WBC: 10.3 10*3/uL (ref 4.0–10.5)

## 2014-11-30 LAB — BRAIN NATRIURETIC PEPTIDE: B NATRIURETIC PEPTIDE 5: 140.7 pg/mL — AB (ref 0.0–100.0)

## 2014-11-30 LAB — I-STAT TROPONIN, ED: TROPONIN I, POC: 0.01 ng/mL (ref 0.00–0.08)

## 2014-11-30 MED ORDER — FENTANYL CITRATE (PF) 100 MCG/2ML IJ SOLN
50.0000 ug | Freq: Once | INTRAMUSCULAR | Status: AC
  Administered 2014-11-30: 50 ug via INTRAVENOUS
  Filled 2014-11-30: qty 2

## 2014-11-30 MED ORDER — IOHEXOL 350 MG/ML SOLN
80.0000 mL | Freq: Once | INTRAVENOUS | Status: AC | PRN
  Administered 2014-11-30: 100 mL via INTRAVENOUS

## 2014-11-30 MED ORDER — SODIUM CHLORIDE 0.9 % IV BOLUS (SEPSIS)
500.0000 mL | Freq: Once | INTRAVENOUS | Status: AC
Start: 2014-11-30 — End: 2014-12-01
  Administered 2014-11-30: 500 mL via INTRAVENOUS

## 2014-11-30 MED ORDER — HYDROMORPHONE HCL 1 MG/ML IJ SOLN
0.5000 mg | Freq: Once | INTRAMUSCULAR | Status: AC
  Administered 2014-11-30: 0.5 mg via INTRAVENOUS
  Filled 2014-11-30: qty 1

## 2014-11-30 NOTE — ED Notes (Signed)
Pt placed on Zoll while in CT.  Pt returned to room.

## 2014-11-30 NOTE — ED Provider Notes (Signed)
CSN: [id number]     Arrival date & time 11/30/14  2214 History  This chart was scribed for Brittany Rice, MD by Evelene Croon, ED Scribe. This patient was seen in room B16C/B16C and the patient's care was started 11:08 PM.    Chief Complaint  Patient presents with  . Back Pain  . Shortness of Breath    The history is provided by the patient. No language interpreter was used.     HPI Comments:  Brittany Greene is a 74 y.o. female brought in by ambulance, who presents to the Emergency Department complaining of 10/10 mid back pain in the left lower thoracic region for the last week that worsened tonight. She reports associated nausea and vomiting. Pt was seen in the ED on 11/25/14 for back pain that radiated to her abdomen. She had a CT scan that showed a AAA. Since discharge pt notes 2 days of improvement of her pain. She denies abdominal pain today. Pt was given fentanyl en route with mild temporary relief.    Past Medical History  Diagnosis Date  . Cancer   . Diabetes mellitus without complication   . Hypertension    Past Surgical History  Procedure Laterality Date  . Breast surgery     History reviewed. No pertinent family history. History  Substance Use Topics  . Smoking status: Former Smoker    Quit date: 11/25/2002  . Smokeless tobacco: Not on file  . Alcohol Use: Yes     Comment: "not very often"   OB History    No data available     Review of Systems  Constitutional: Negative for fever and chills.  Respiratory: Negative for cough and shortness of breath.   Cardiovascular: Negative for chest pain.  Gastrointestinal: Positive for nausea and vomiting. Negative for abdominal pain.  Musculoskeletal: Positive for back pain. Negative for neck pain and neck stiffness.  Skin: Negative for rash and wound.  Neurological: Negative for dizziness, weakness, light-headedness, numbness and headaches.  All other systems reviewed and are negative.     Allergies  Review of  patient's allergies indicates no known allergies.  Home Medications   Prior to Admission medications   Medication Sig Start Date End Date Taking? Authorizing Provider  lisinopril (PRINIVIL,ZESTRIL) 40 MG tablet Take 40 mg by mouth daily.   Yes Historical Provider, MD  metFORMIN (GLUCOPHAGE-XR) 500 MG 24 hr tablet Take 1,000 mg by mouth daily with breakfast.   Yes Historical Provider, MD  NIFEdipine (PROCARDIA-XL/ADALAT CC) 30 MG 24 hr tablet Take 30 mg by mouth daily.   Yes Historical Provider, MD  ondansetron (ZOFRAN) 4 MG tablet Take 1 tablet (4 mg total) by mouth every 6 (six) hours as needed for nausea or vomiting. 0/9/38  Yes Delora Fuel, MD  oxyCODONE-acetaminophen (PERCOCET) 5-325 MG per tablet Take 1 tablet by mouth every 4 (four) hours as needed for moderate pain. 06/03/27  Yes Delora Fuel, MD  simvastatin (ZOCOR) 20 MG tablet Take 20 mg by mouth daily.   Yes Historical Provider, MD   BP 105/73 mmHg  Pulse 61  Temp(Src) 98.1 F (36.7 C) (Oral)  Resp 14  Ht 5\' 5"  (1.651 m)  Wt 157 lb (71.215 kg)  BMI 26.13 kg/m2  SpO2 99% Physical Exam  Constitutional: She is oriented to person, place, and time. She appears well-developed and well-nourished. No distress.  HENT:  Head: Normocephalic and atraumatic.  Mouth/Throat: Oropharynx is clear and moist.  Eyes: EOM are normal. Pupils are equal, round, and  reactive to light.  Neck: Normal range of motion. Neck supple. No JVD present.  Cardiovascular: Normal rate and regular rhythm.  Exam reveals no gallop and no friction rub.   No murmur heard. Pulmonary/Chest: Effort normal and breath sounds normal. No respiratory distress. She has no wheezes. She has no rales. She exhibits no tenderness.  Abdominal: Soft. Bowel sounds are normal. She exhibits no distension and no mass. There is no tenderness. There is no rebound and no guarding.  No appreciated pulsatile masses  Musculoskeletal: Normal range of motion. She exhibits no edema or  tenderness.  No thoracic or lumbar tenderness. Bilateral radial pulses are equal. Equal bilateral dorsalis pedis pulses  Neurological: She is alert and oriented to person, place, and time.  5/5 motor in all extremities. Sensation is fully intact.  Skin: Skin is warm and dry. No rash noted. No erythema.  Psychiatric: She has a normal mood and affect. Her behavior is normal.  Nursing note and vitals reviewed.   ED Course  Procedures   DIAGNOSTIC STUDIES:  Oxygen Saturation is 100% on RA, normal by my interpretation.    COORDINATION OF CARE:  11:14 PM Discussed treatment plan with pt at bedside and pt agreed to plan. 12:24 AM Discussed case with Dr. Bridgett Larsson - vascular surgery recommends admission for blood pressure control  12:59 AM Spoke with Dr. Bridgett Larsson who now advises transfer to [facility] as the surgeons here my be unable to handle repair should pt need emergent repair 1:10 AM Dr. Marilynne Drivers surgeon at [facility] agrees to transfer and with esmolol drip and target BP 1:28 AM Dr. Vernell Leep (EDP) accepts pt for transfer to [facility] ED.     Labs Review Labs Reviewed  CBC WITH DIFFERENTIAL/PLATELET - Abnormal; Notable for the following:    RBC 3.83 (*)    Hemoglobin 9.7 (*)    HCT 29.9 (*)    MCH 25.3 (*)    Neutrophils Relative % 79 (*)    Neutro Abs 8.1 (*)    All other components within normal limits  COMPREHENSIVE METABOLIC PANEL - Abnormal; Notable for the following:    Potassium 3.0 (*)    Glucose, Bld 154 (*)    Creatinine, Ser 1.30 (*)    Albumin 3.4 (*)    GFR calc non Af Amer 39 (*)    GFR calc Af Amer 46 (*)    All other components within normal limits  BRAIN NATRIURETIC PEPTIDE - Abnormal; Notable for the following:    B Natriuretic Peptide 140.7 (*)    All other components within normal limits  PROTIME-INR - Abnormal; Notable for the following:    Prothrombin Time 15.7 (*)    All other components within normal limits  APTT  I-STAT TROPOININ, ED  TYPE AND SCREEN  ABO/RH     Imaging Review Dg Chest 2 View  11/30/2014   CLINICAL DATA:  Acute onset of upper back pain. Shortness of breath. Initial encounter.  EXAM: CHEST  2 VIEW  COMPARISON:  CT of the abdomen and pelvis from 11/25/2014  FINDINGS: The lungs are well-aerated and clear. There is no evidence of focal opacification, pleural effusion or pneumothorax.  There is marked dilatation of the descending thoracic aorta, measuring up to 7.1 cm. No acute osseous abnormalities are seen. Clips are seen at the left axilla.  IMPRESSION: Marked dilatation of the descending thoracic aorta, measuring up to 7.1 cm. CTA of the chest is recommended for further evaluation. Depending on the patient's symptoms and the degree  of clinical concern for dissection, CTA of the abdomen and pelvis could also be considered, though a CT of the abdomen and pelvis was recently performed.  These results were called by telephone at the time of interpretation on 11/30/2014 at 11:06 pm to Dr. Pamella Pert, who verbally acknowledged these results.   Electronically Signed   By: Garald Balding M.D.   On: 11/30/2014 23:07   Ct Angio Abdomen W/cm &/or Wo Contrast  12/01/2014   CLINICAL DATA:  Acute onset of severe back pain, nausea and vomiting. Initial encounter.  EXAM: CT ANGIOGRAPHY CHEST, ABDOMEN AND PELVIS  TECHNIQUE: Multidetector CT imaging through the chest, abdomen and pelvis was performed using the standard protocol during bolus administration of intravenous contrast. Multiplanar reconstructed images and MIPs were obtained and reviewed to evaluate the vascular anatomy.  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 The dissection flap extends minimally into the left subclavian  artery, but the false lumen is otherwise fully thrombosed, without evidence of extension into the branches of the abdominal aorta.  There is underlying dilatation of the ascending thoracic aorta to 5.0 cm in maximal diameter. The diameter of the distal descending thoracic and proximal abdominal aorta is unchanged from the recent prior CT, though expansion of the thrombosed false lumen is new, resulting in slightly decreased luminal diameter.  Mild bibasilar atelectasis or scarring is noted. A trace left pleural effusion is noted. The lungs are otherwise grossly clear. No pneumothorax is seen. No masses are identified.  Scattered calcification is noted along the thoracic aorta. No mediastinal lymphadenopathy is seen. The heart is mildly enlarged. Scattered coronary artery calcifications are seen. 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 No pericardial effusion is identified. The visualized portions of thyroid gland are unremarkable. No axillary lymphadenopathy is seen.  The patient's left breast implant appears partially collapsed, though it is contained by its fibrous capsule.  No acute osseous abnormalities are seen. Endplate sclerotic change is noted at multiple levels along the thoracic spine.  Review of the MIP images confirms the above findings.  CTA ABDOMEN AND PELVIS FINDINGS  Aneurysmal dilatation of the abdominal aorta is stable from the recent prior study, measuring up to 3.8 cm in AP dimension and 4.3 cm in transverse dimension. As described above, a thrombosed false lumen extends along the right side of the proximal abdominal aorta, wrapping anteriorly just below the level of the renal arteries before resolving. Clot along the left side of the abdominal aorta demonstrates low attenuation, without additional evidence for a thrombosed false lumen.  There is no evidence of extension of the dissection flap into the branches of the  abdominal aorta. There is chronic narrowing of the proximal celiac trunk. The superior mesenteric artery is unremarkable in appearance. There is focal narrowing at the proximal left renal artery, though this appears unrelated to the dissection, with additional calcific atherosclerotic disease noted along the course of the left renal artery.  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 Scattered  calcification is noted along the abdominal aorta and its branches.  The liver and spleen are unremarkable in appearance. The gallbladder is within normal limits. The pancreas and adrenal glands are unremarkable.  The kidneys are unremarkable in appearance. There is no evidence of hydronephrosis. No renal or ureteral stones are seen. No perinephric stranding is appreciated.  No free fluid is identified. The small bowel is unremarkable in appearance. The stomach is within normal limits. No acute vascular abnormalities are seen.  The appendix is normal in caliber and contains air, without evidence of appendicitis. Scattered diverticulosis is noted along the descending and sigmoid colon, without evidence of diverticulitis.  The bladder is mildly distended and grossly unremarkable. The uterus is grossly unremarkable in appearance. The ovaries are grossly symmetric. No suspicious adnexal masses are seen. No inguinal lymphadenopathy is seen.  No acute osseous abnormalities are identified.  Review of the MIP images confirms the above findings.  IMPRESSION: 1. 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 The dissection flap extends minimally into the left subclavian artery, but the false lumen is otherwise fully  thrombosed, without evidence of extension into the branches of the abdominal aorta. 2. Underlying dilatation of the ascending thoracic aorta to 5.0 cm in maximal diameter. Stable dilatation of the abdominal aorta to 4.3 cm in transverse dimension. Expansion of the thrombosed false lumen is new from the prior study, resulting in slightly decreased luminal diameter along the distal thoracic and proximal abdominal aorta. 3. Thrombosed false lumen extends along the right side of the proximal abdominal aorta, wrapping anteriorly just below the level of the renal arteries before resolving. Clot along the left side of the abdominal aorta demonstrates low attenuation and reflects chronic mural thrombus. 4. 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 Additional calcific atherosclerotic disease noted along the course of the left renal artery. 5. 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 6. Mild bibasilar atelectasis or scarring noted. Trace left pleural effusion seen. 7. Mild cardiomegaly noted. Scattered coronary artery calcifications seen. 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 8. Scattered diverticulosis along the descending and sigmoid colon, without evidence of diverticulitis.  These results were called by telephone at the time of interpretation on 12/01/2014 at 12:15 am to Dr. Julianne Greene, who verbally acknowledged these results.   Electronically Signed   By: Garald Balding M.D.   On: 12/01/2014 00:46   Ct Angio Chest Aorta W/cm &/or Wo/cm  12/01/2014   CLINICAL DATA:  Acute onset of severe back pain, nausea and vomiting. Initial encounter.  EXAM: CT ANGIOGRAPHY CHEST, ABDOMEN AND PELVIS  TECHNIQUE: Multidetector CT imaging through the  chest, abdomen and pelvis was performed using the standard protocol during bolus administration of intravenous contrast. Multiplanar reconstructed images and MIPs were obtained and reviewed to evaluate the vascular anatomy.  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 The dissection flap extends minimally into the left  subclavian artery, but the false lumen is otherwise fully thrombosed, without evidence of extension into the branches of the abdominal aorta.  There is underlying dilatation of the ascending thoracic aorta to 5.0 cm in maximal diameter. The diameter of the distal descending thoracic and proximal abdominal aorta is unchanged from the recent prior CT, though expansion of the thrombosed false lumen is new, resulting in slightly decreased luminal diameter.  Mild bibasilar atelectasis or scarring is noted. A trace left pleural effusion is noted. The lungs are otherwise grossly clear. No pneumothorax is seen. No masses are identified.  Scattered calcification is noted along the thoracic aorta. No mediastinal lymphadenopathy is seen. The heart is mildly enlarged. Scattered coronary artery calcifications are seen. 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 No pericardial effusion is identified. The visualized portions of thyroid gland are unremarkable. No axillary lymphadenopathy is seen.  The patient's left breast implant appears partially collapsed, though it is contained by its fibrous capsule.  No acute osseous abnormalities are seen. Endplate sclerotic change is noted at multiple levels along the thoracic spine.  Review of the MIP images confirms the  above findings.  CTA ABDOMEN AND PELVIS FINDINGS  Aneurysmal dilatation of the abdominal aorta is stable from the recent prior study, measuring up to 3.8 cm in AP dimension and 4.3 cm in transverse dimension. As described above, a thrombosed false lumen extends along the right side of the proximal abdominal aorta, wrapping anteriorly just below the level of the renal arteries before resolving. Clot along the left side of the abdominal aorta demonstrates low attenuation, without additional evidence for a thrombosed false lumen.  There is no evidence of extension of the dissection flap into the branches of the abdominal aorta. There is chronic narrowing of the proximal celiac trunk. The superior mesenteric artery is unremarkable in appearance. There is focal narrowing at the proximal left renal artery, though this appears unrelated to the dissection, with additional calcific atherosclerotic disease noted along the course of the left renal artery.  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 Scattered calcification is noted along the abdominal aorta and its branches.  The liver and spleen are unremarkable in appearance. The gallbladder is within normal limits. The pancreas and adrenal glands are unremarkable.  The kidneys are unremarkable in appearance. There is no evidence of hydronephrosis. No renal or ureteral stones are seen. No perinephric stranding is appreciated.  No free fluid is identified. The small bowel is unremarkable in appearance. The stomach is within normal limits. No acute vascular abnormalities are seen.  The appendix is normal in caliber and contains air, without evidence of appendicitis. Scattered diverticulosis is noted along the descending and sigmoid colon, without evidence of diverticulitis.  The bladder  is mildly distended and grossly unremarkable. The uterus is grossly unremarkable in appearance. The ovaries are grossly symmetric. No suspicious adnexal masses are seen. No inguinal lymphadenopathy is seen.  No acute osseous abnormalities are identified.  Review of the MIP images confirms the above findings.  IMPRESSION: 1. 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 The dissection flap extends minimally into the left subclavian artery, but the false lumen is otherwise  fully thrombosed, without evidence of extension into the branches of the abdominal aorta. 2. Underlying dilatation of the ascending thoracic aorta to 5.0 cm in maximal diameter. Stable dilatation of the abdominal aorta to 4.3 cm in transverse dimension. Expansion of the thrombosed false lumen is new from the prior study, resulting in slightly decreased luminal diameter along the distal thoracic and proximal abdominal aorta. 3. Thrombosed false lumen extends along the right side of the proximal abdominal aorta, wrapping anteriorly just below the level of the renal arteries before resolving. Clot along the left side of the abdominal aorta demonstrates low attenuation and reflects chronic mural thrombus. 4. 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 Additional calcific atherosclerotic disease noted along the course of the left renal artery. 5. 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 6. Mild bibasilar atelectasis or scarring noted. Trace left pleural effusion seen. 7. Mild cardiomegaly noted. Scattered coronary artery calcifications seen. 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 8. Scattered diverticulosis along the descending and sigmoid colon, without evidence of diverticulitis.  These results were called by telephone at the time of interpretation on 12/01/2014 at 12:15 am to Dr. Julianne Greene, who verbally acknowledged these results.   Electronically Signed   By: Garald Balding M.D.   On: 12/01/2014 00:46     EKG Interpretation   Date/Time:  Thursday November 30 2014 22:19:23 EDT Ventricular Rate:  78 PR Interval:  205 QRS Duration: 81 QT Interval:  466 QTC Calculation: 531 R Axis:   -6 Text Interpretation:  Sinus rhythm Borderline T abnormalities, anterior  leads Prolonged QT interval Confirmed by Aline Brochure  MD, [place] ([id number]) on  11/30/2014 10:21:53 PM Also confirmed by Lita Mains  MD, Arnika Larzelere ([id number])  on  11/30/2014 11:20:21 PM     CRITICAL CARE Performed by: Lita Mains, Jonte Shiller Total critical care time: 45 min Critical care time was exclusive of separately billable procedures and treating other patients. Critical care was necessary to treat or prevent imminent or life-threatening deterioration. Critical care was time spent personally by me on the following activities: development of treatment plan with patient and/or surrogate as well as nursing, discussions with consultants, evaluation of patient's response to treatment, examination of patient, obtaining history from patient or surrogate, ordering and performing treatments and interventions, ordering and review of laboratory studies, ordering and review of radiographic studies, pulse oximetry and re-evaluation of patient's condition.  MDM   Final diagnoses:  Dissecting aortic aneurysm, thoracic    I personally performed the services described in this documentation, which was scribed in my presence. The recorded information has been reviewed and is accurate.   Patient's symptoms controlled with IV Dilaudid. Also started on esmolol drip. Target blood pressure systolic 623 per Dr. Bridgett Larsson.  Discussed with Dr. Frederico Hamman, [facility] vascular surgery. Dr. Vernell Leep will accept in transfer.   Brittany Rice, MD 12/01/14 [phone]

## 2014-11-30 NOTE — ED Notes (Signed)
Per EMS - pt comes from home c/o severe back pain, N/V.  Pt was here 11/25/2014 for back pain that radiated to her abdomen, CT scan revealed AAA, was sent home after being monitored.  Pt reports the oxycodone she was sent home with wasn't working, pain still 9/10.  Tried to drive self, but felt nauseated and called 911.  Given total 164mcg fentanyl en route, denied zofran.  EMS VS: 136/82, HR 102, went down to 80 after pain meds, CBG 162 (told to stop metformin for couple days, started back today).  Pain currently 7/10, no radiation to abdomen at this time.

## 2014-12-01 DIAGNOSIS — M546 Pain in thoracic spine: Secondary | ICD-10-CM | POA: Diagnosis present

## 2014-12-01 DIAGNOSIS — I10 Essential (primary) hypertension: Secondary | ICD-10-CM | POA: Diagnosis not present

## 2014-12-01 DIAGNOSIS — Z859 Personal history of malignant neoplasm, unspecified: Secondary | ICD-10-CM | POA: Diagnosis not present

## 2014-12-01 DIAGNOSIS — I7101 Dissection of thoracic aorta: Secondary | ICD-10-CM | POA: Diagnosis not present

## 2014-12-01 DIAGNOSIS — Z79899 Other long term (current) drug therapy: Secondary | ICD-10-CM | POA: Diagnosis not present

## 2014-12-01 DIAGNOSIS — R112 Nausea with vomiting, unspecified: Secondary | ICD-10-CM | POA: Diagnosis not present

## 2014-12-01 DIAGNOSIS — Z87891 Personal history of nicotine dependence: Secondary | ICD-10-CM | POA: Diagnosis not present

## 2014-12-01 DIAGNOSIS — E119 Type 2 diabetes mellitus without complications: Secondary | ICD-10-CM | POA: Diagnosis not present

## 2014-12-01 LAB — URINALYSIS, ROUTINE W REFLEX MICROSCOPIC
Bilirubin Urine: NEGATIVE
GLUCOSE, UA: NEGATIVE mg/dL
HGB URINE DIPSTICK: NEGATIVE
KETONES UR: NEGATIVE mg/dL
Leukocytes, UA: NEGATIVE
Nitrite: NEGATIVE
PROTEIN: NEGATIVE mg/dL
Specific Gravity, Urine: 1.041 — ABNORMAL HIGH (ref 1.005–1.030)
Urobilinogen, UA: 1 mg/dL (ref 0.0–1.0)
pH: 7 (ref 5.0–8.0)

## 2014-12-01 LAB — PROTIME-INR
INR: 1.23 (ref 0.00–1.49)
Prothrombin Time: 15.7 seconds — ABNORMAL HIGH (ref 11.6–15.2)

## 2014-12-01 LAB — APTT: aPTT: 30 seconds (ref 24–37)

## 2014-12-01 LAB — TYPE AND SCREEN
ABO/RH(D): O POS
ANTIBODY SCREEN: NEGATIVE

## 2014-12-01 LAB — ABO/RH: ABO/RH(D): O POS

## 2014-12-01 MED ORDER — ESMOLOL HCL-SODIUM CHLORIDE 2000 MG/100ML IV SOLN
25.0000 ug/kg/min | Freq: Once | INTRAVENOUS | Status: AC
  Administered 2014-12-01: 25 ug/kg/min via INTRAVENOUS
  Filled 2014-12-01 (×2): qty 100

## 2014-12-01 MED ORDER — HYDROMORPHONE HCL 1 MG/ML IJ SOLN
0.5000 mg | Freq: Once | INTRAMUSCULAR | Status: AC
Start: 2014-12-01 — End: 2014-12-01
  Administered 2014-12-01: 0.5 mg via INTRAVENOUS
  Filled 2014-12-01: qty 1

## 2014-12-01 NOTE — Progress Notes (Signed)
When I went to get this patient, I spoke with the tech assigned to her room and asked if ok to go to CT and if a nurse needed to go with me. He stated no. The nurse came out of another room and the tech and I questioned whether a nurse needed to go to CT with me. She stated no. Upon speaking with the patient and her daughter, we discussed that I scanned her Friday night and the found AAA in abdomen and now one was seen on CXR tonight, thus leading to CT scan of chest ordered tonight. Pt brought to CT and was transferred on a board to the CT table. Upon completing scan, nurse and tech came to CT with [place], placed pt on the [place] and took her back to her room. She was transferred back to bed using slide board.

## 2014-12-01 NOTE — ED Notes (Signed)
MD at bedside. 

## 2014-12-01 NOTE — ED Notes (Signed)
Unable to obtain pt's signature for transfer, pt and pt's family gave consent for pt to be transferred to [facility].

## 2014-12-01 NOTE — Progress Notes (Signed)
   Daily Progress Note  I was asked by Dr. Lita Mains to review this patient's CTA to give some guidance.  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  The patient primarily has a thoracic symptoms currently which have abated.  - I have been deferring sx TAAA to [facility] for mgmt as no TAAA have been repaired open surgically at [facility] over the last 5 years.  - Repair of TAAA has evolved over the last 5 years to a combination of fenestrated endografting (FEVAR) and thoracic endografting (TEVAR)  - At [facility], only 1 out 6 Vascular surgeons has privilleges to perform FEVAR.  Unfortunately, he will be on vacation for 2 weeks, so I don't recommended even medical mgmt of this patient in-house as there is will be no one in-house able to repair this patient in the event of acute decompensation.   Adele Barthel, MD [facility] Office: [phone] Pager: [phone]  12/01/2014, 12:53 AM

## 2014-12-01 NOTE — ED Notes (Signed)
CT called and informed discs of scans are needed for transport.

## 2014-12-05 ENCOUNTER — Telehealth: Payer: Self-pay | Admitting: Vascular Surgery

## 2014-12-05 NOTE — Telephone Encounter (Signed)
-----   Message from Denman George, RN sent at 12/05/2014 12:22 PM EDT ----- Regarding: cancel new patient appt.  Rec'd call from pt's daughter, Marne Meline, requesting to cancel appt. on 7/20 with Dr. Bridgett Larsson.  Reported pt. being evaluated at [facility].

## 2014-12-13 ENCOUNTER — Encounter: Payer: Medicare Other | Admitting: Vascular Surgery

## 2015-01-02 DIAGNOSIS — I723 Aneurysm of iliac artery: Secondary | ICD-10-CM | POA: Insufficient documentation

## 2015-02-06 ENCOUNTER — Other Ambulatory Visit: Payer: Self-pay

## 2015-02-06 DIAGNOSIS — Z9012 Acquired absence of left breast and nipple: Secondary | ICD-10-CM

## 2015-02-06 DIAGNOSIS — Z1231 Encounter for screening mammogram for malignant neoplasm of breast: Secondary | ICD-10-CM

## 2015-03-01 ENCOUNTER — Ambulatory Visit
Admission: RE | Admit: 2015-03-01 | Discharge: 2015-03-01 | Disposition: A | Discharge status: Home / Self Care | Payer: Medicare Other | Source: Ambulatory Visit

## 2015-03-01 DIAGNOSIS — Z1231 Encounter for screening mammogram for malignant neoplasm of breast: Secondary | ICD-10-CM

## 2015-03-01 DIAGNOSIS — Z9012 Acquired absence of left breast and nipple: Secondary | ICD-10-CM

## 2015-03-06 DIAGNOSIS — Z8679 Personal history of other diseases of the circulatory system: Secondary | ICD-10-CM | POA: Insufficient documentation

## 2015-03-06 HISTORY — PX: THORACIC AORTIC ANEURYSM REPAIR: SHX799

## 2015-05-31 ENCOUNTER — Ambulatory Visit: Payer: Medicare Other | Attending: *Deleted

## 2015-05-31 DIAGNOSIS — R531 Weakness: Secondary | ICD-10-CM | POA: Insufficient documentation

## 2015-05-31 DIAGNOSIS — Z853 Personal history of malignant neoplasm of breast: Secondary | ICD-10-CM | POA: Insufficient documentation

## 2015-05-31 DIAGNOSIS — Z7409 Other reduced mobility: Secondary | ICD-10-CM | POA: Insufficient documentation

## 2015-05-31 DIAGNOSIS — M6281 Muscle weakness (generalized): Secondary | ICD-10-CM

## 2015-05-31 NOTE — Therapy (Addendum)
[facility] [address] [place], [place], [address] Phone: [phone]   Fax:  [phone]  Physical Therapy Evaluation  Patient Details  Name: Brittany Greene MRN: [id number] Date of Birth: [birth year] Referring Provider: Everardo Beals  Encounter Date: 05/31/2015      PT End of Session - 05/31/15 1517    Visit Number 1   Number of Visits 10   Date for PT Re-Evaluation 07/12/15   PT Start Time [id number]   PT Stop Time 1530   PT Time Calculation (min) 39 min   Activity Tolerance Patient tolerated treatment well   Behavior During Therapy [facility] for tasks assessed/performed      Past Medical History  Diagnosis Date  . Diabetes mellitus without complication ([place])   . Hypertension   . Cancer ([place])   . Breast cancer [facility])     Past Surgical History  Procedure Laterality Date  . Breast surgery    . Mastectomy Left 2003  . Thoracic aortic aneurysm repair  03/06/2015    There were no vitals filed for this visit.  Visit Diagnosis:  Decreased strength, endurance, and mobility - Plan: PT plan of care cert/re-cert  Generalized muscle weakness (M62.81)      Subjective Assessment - 05/31/15 1500    Subjective Pt presents to PT with generalized weakness s/p thoracic aneurysm surgery 03/06/15.  Pt was using walker for a period of time and has had home health PT for strength gains. Pt has home exercises and is not consistent with HEP.     Pertinent History breast cancer with Lt mastectomy 2003, thoracic aneurysm repair 02/2015   How long can you walk comfortably? pt has not been pushing herself, fatigue with shopping in the community   Patient Stated Goals improve strength and endurance, learn gym exercises   Currently in Pain? No/denies            [facility] PT Assessment - 05/31/15 0001    Assessment   Medical Diagnosis weakness (generalized)   Referring Provider Everardo Beals   Onset Date/Surgical Date 03/06/15    Prior Therapy inpatient rehab x 9 days, home health PT   Precautions   Precautions Other (comment)  history of breast cancer: no [place] or arm bike   Restrictions   Weight Bearing Restrictions No   Balance Screen   Has the patient fallen in the past 6 months No   Has the patient had a decrease in activity level because of a fear of falling?  No   Is the patient reluctant to leave their home because of a fear of falling?  No   Home Environment   Living Environment Private residence   Type of Home Apartment   Home Access Level entry   Prior Function   Level of [place] Retired   Leisure walk   Cognition   Overall Cognitive Status Within Functional Limits for tasks assessed   Posture/Postural Control   Posture/Postural Control No significant limitations   ROM / Strength   AROM / PROM / Strength AROM;Strength   AROM   Overall AROM  Within functional limits for tasks performed   Strength   Overall Strength Within functional limits for tasks performed   Overall Strength Comments UE and LE strenth 4+/5 to 5/5 throughout.  Lt LE is 4+/5 vs 5/5 Rt LE.    Palpation   Palpation comment NA   Ambulation/Gait   Ambulation/Gait Yes   Ambulation/Gait Assistance 7: Independent  Gait Pattern Within Functional Limits                   [facility] Adult PT Treatment/Exercise - 2015-06-09 0001    Exercises   Exercises Knee/Hip;Shoulder   Knee/Hip Exercises: Aerobic   Stationary Bike Level 2 x 10 minutes                  PT Short Term Goals - 2015/06/09 1522    PT SHORT TERM GOAL #1   Title be independent in initial HEP   Time 3   Period Weeks   Status New           PT Long Term Goals - 06-09-2015 1522    PT LONG TERM GOAL #1   Title be independent in advanced HEP for strength and endurance   Time 6   Period Weeks   Status New   PT LONG TERM GOAL #2   Title verbalize and demonstrate understanding of safe progression of gym exercise for  strength/endurance   Time 6   Period Weeks   Status New   PT LONG TERM GOAL #3   Title report a 50% improvement in endurance for community activity   Time 6   Period Weeks   Status New   PT LONG TERM GOAL #4   Title negotiate steps with step-over-step gait without fatigue at her daughter's house   Time 6   Period Weeks   Status New               Plan - June 09, 2015 1518    Clinical Impression Statement Pt presnets to PT with complaints of generalized functional weakness and edurance deficits.  Pt has thoracic aneurysm repair surgery 02/2015 followed by stay in inpatient rehab and home health PT.  Pt reports that she is fatigued with community ambulation and has difficulty with steps including fatigue and weakness.  Pt would like to learn exercise for gym exercise as she has a workout facility at her apartment complex.  Pt will benefit from skilled PT for advancement of HEP for strength and endurance and to safety progress a gym exercise program for independence with progression after discharge.     Pt will benefit from skilled therapeutic intervention in order to improve on the following deficits Decreased activity tolerance;Decreased endurance;Decreased strength   Rehab Potential Good   PT Frequency 2x / week   PT Duration 6 weeks   PT Treatment/Interventions ADLs/Self Care Home Management;Therapeutic exercise;Therapeutic activities;Functional mobility training;Stair training;Neuromuscular re-education;Patient/family education   PT Next Visit Plan build HEP for strength, gym exercise program   Consulted and Agree with Plan of Care Patient          G-Codes - 06-09-2015 1517    Functional Assessment Tool Used Clinical judgement   Functional Limitation Other PT primary   Other PT Primary Current Status [id number]) At least 1 percent but less than 20 percent impaired, limited or restricted   Other PT Primary Goal Status [id number]) At least 1 percent but less than 20 percent impaired,  limited or restricted       Problem List There are no active problems to display for this patient.   TAKACS,KELLY, PT Jun 09, 2015, 3:29 PM  [facility] [address] [place], [place], [address] Phone: [phone]   Fax:  [phone]  Name: Iyunna Schlender MRN: [id number] Date of Birth: 30-Jun-1940

## 2015-06-04 ENCOUNTER — Encounter: Payer: Medicare Other | Admitting: Physical Therapy

## 2015-06-06 ENCOUNTER — Encounter: Payer: Medicare Other | Admitting: Physical Therapy

## 2015-06-08 ENCOUNTER — Encounter: Payer: Self-pay | Admitting: Physical Therapy

## 2015-06-08 ENCOUNTER — Ambulatory Visit: Payer: Medicare Other | Admitting: Physical Therapy

## 2015-06-08 DIAGNOSIS — M6281 Muscle weakness (generalized): Secondary | ICD-10-CM

## 2015-06-08 DIAGNOSIS — Z7409 Other reduced mobility: Secondary | ICD-10-CM | POA: Diagnosis not present

## 2015-06-08 NOTE — Therapy (Addendum)
[facility] [address] [place], [place], [address] Phone: [phone]   Fax:  [phone]  Physical Therapy Treatment  Patient Details  Name: Brittany Greene MRN: [id number] Date of Birth: 21-May-1941 Referring Provider: Everardo Beals  Encounter Date: 06/08/2015      PT End of Session - 06/08/15 1021    Visit Number 2   Number of Visits 10   Date for PT Re-Evaluation 07/12/15   PT Start Time [id number]   PT Stop Time 1056   PT Time Calculation (min) 41 min   Activity Tolerance Patient tolerated treatment well   Behavior During Therapy [facility] for tasks assessed/performed      Past Medical History  Diagnosis Date  . Diabetes mellitus without complication ([place])   . Hypertension   . Cancer ([place])   . Breast cancer [facility])     Past Surgical History  Procedure Laterality Date  . Breast surgery    . Mastectomy Left 2003  . Thoracic aortic aneurysm repair  03/06/2015    There were no vitals filed for this visit.  Visit Diagnosis:   Muscle weakness (generalized) M62.81      Subjective Assessment - 06/08/15 1015    Subjective Feeling good today.   Currently in Pain? No/denies                         [facility] Adult PT Treatment/Exercise - 06/08/15 0001    Knee/Hip Exercises: Aerobic   Stationary Bike L2 x 10 min   Knee/Hip Exercises: Standing   Walking with Sports Cord 20# 10x fwrd Williemae Natter  SBA for balance                PT Education - 06/08/15 1026    Education provided Yes   Education Details Review of current and added: 3 way raise for deltoids with 1-2#    Person(s) Educated Patient   Methods Explanation;Demonstration;Handout;Verbal cues;Tactile cues   Comprehension Verbalized understanding;Returned demonstration          PT Short Term Goals - 05/31/15 1522    PT SHORT TERM GOAL #1   Title be independent in initial HEP   Time 3   Period Weeks   Status New            PT Long Term Goals - 05/31/15 1522    PT LONG TERM GOAL #1   Title be independent in advanced HEP for strength and endurance   Time 6   Period Weeks   Status New   PT LONG TERM GOAL #2   Title verbalize and demonstrate understanding of safe progression of gym exercise for strength/endurance   Time 6   Period Weeks   Status New   PT LONG TERM GOAL #3   Title report a 50% improvement in endurance for community activity   Time 6   Period Weeks   Status New   PT LONG TERM GOAL #4   Title negotiate steps with step-over-step gait without fatigue at her daughter's house   Time [place] - 06/08/15 1055    Clinical Impression Statement Reviewed with pt what she was currently doing as part of her HEP and added to it to compliment. She will first try the bike in her gym, and then take pictures of any equipment she is interested in  trying so we can guide with the implementation. This was her fist treatment so too ealrly for goals or progress towards them.    Pt will benefit from skilled therapeutic intervention in order to improve on the following deficits Decreased activity tolerance;Decreased endurance;Decreased strength   PT Frequency 2x / week   PT Treatment/Interventions ADLs/Self Care Home Management;Therapeutic exercise;Therapeutic activities;Functional mobility training;Stair training;Neuromuscular re-education;Patient/family education   PT Next Visit Plan Continue wot build HEP & seew how she did with what we discussed today.   Consulted and Agree with Plan of Care Patient        Problem List There are no active problems to display for this patient.   Shawnika Pepin, PTA 06/08/2015, 10:58 AM  [facility] [address], [place], [place], [address] Phone: [phone]   Fax:  [phone]  Name: Brittany Greene MRN: [id number] Date of Birth: 1941-03-19

## 2015-06-08 NOTE — Patient Instructions (Signed)
Home exercises:  1. Arm lifts in 3 directions: lift front, angle, side: Eventually we will add weights to home, but you can hold soups cans if you want. 10x each direction.   2. Squats 2x 15 like you are currently doing  3. Standing leg lifts to the side: 2 x 20 alternating legs.   4. Standing leg lifts back: 2 x20 alternating legs.   5. Seated leg kicks straight: Slow 5 sec hold 10 x each leg  Try these exercises 2x day, if too much do each at least 1x but sprinkle throughout the day.

## 2015-06-11 ENCOUNTER — Ambulatory Visit: Payer: Medicare Other | Admitting: Physical Therapy

## 2015-06-11 ENCOUNTER — Encounter: Payer: Self-pay | Admitting: Physical Therapy

## 2015-06-11 DIAGNOSIS — Z7409 Other reduced mobility: Secondary | ICD-10-CM | POA: Diagnosis not present

## 2015-06-11 DIAGNOSIS — M6281 Muscle weakness (generalized): Secondary | ICD-10-CM

## 2015-06-11 NOTE — Therapy (Addendum)
[facility] [address] [place], [place], [address] Phone: [phone]   Fax:  [phone]  Physical Therapy Treatment  Patient Details  Name: Brittany Greene MRN: [id number] Date of Birth: 04/28/1941 Referring Provider: Everardo Beals  Encounter Date: 06/11/2015      PT End of Session - 06/11/15 1452    Visit Number 3   Number of Visits 10   Date for PT Re-Evaluation 07/12/15   PT Start Time [id number]   PT Stop Time 1529   PT Time Calculation (min) 44 min   Activity Tolerance Patient tolerated treatment well   Behavior During Therapy [facility] for tasks assessed/performed      Past Medical History  Diagnosis Date  . Diabetes mellitus without complication ([place])   . Hypertension   . Cancer ([place])   . Breast cancer [facility])     Past Surgical History  Procedure Laterality Date  . Breast surgery    . Mastectomy Left 2003  . Thoracic aortic aneurysm repair  03/06/2015    There were no vitals filed for this visit.  Visit Diagnosis:  Muscle weakness (generalized) M62.81      Subjective Assessment - 06/11/15 1450    Subjective Feeling good today   Pertinent History breast cancer with Lt mastectomy 2003, thoracic aneurysm repair 02/2015   How long can you walk comfortably? pt has not been pushing herself, fatigue with shopping in the community   Patient Stated Goals improve strength and endurance, learn gym exercises   Currently in Pain? No/denies   Multiple Pain Sites No                         [facility] Adult PT Treatment/Exercise - 06/11/15 0001    Exercises   Exercises Knee/Hip;Shoulder   Knee/Hip Exercises: Aerobic   Stationary Bike L2 x 10 min   Knee/Hip Exercises: Standing   Hip ADduction Strengthening;Both;2 sets;10 reps  3# added   Walking with Sports Cord 20#  forward/reverse backward/reverse x 10 each direction  supervision for safety   Other Standing Knee Exercises squats 2 x 15  with  B UE support for balance   Other Standing Knee Exercises squating with red physioball x 10    Shoulder Exercises: Seated   Other Seated Exercises 3 way raises x 10                   PT Short Term Goals - 06/11/15 1458    PT SHORT TERM GOAL #1   Title be independent in initial HEP   Time 3   Period Weeks   Status On-going           PT Long Term Goals - 06/11/15 1458    PT LONG TERM GOAL #1   Title be independent in advanced HEP for strength and endurance   Time 6   Period Weeks   Status On-going   PT LONG TERM GOAL #2   Title verbalize and demonstrate understanding of safe progression of gym exercise for strength/endurance   Time 6   Period Weeks   Status On-going   PT LONG TERM GOAL #3   Title report a 50% improvement in endurance for community activity   Time 6   Period Weeks   Status On-going   PT LONG TERM GOAL #4   Title negotiate steps with step-over-step gait without fatigue at her daughter's house   Time 6   Period Weeks  Status On-going               Plan - 06/11/15 1455    Clinical Impression Statement Pt able to tolerate activities in PT session well and wishes to learn more for safe return to her gym program. Pt is reminded to take pictures of equipment she is interested in trying at the gym. Pt will continue to benefit from skilled PT to progress overall strength and endurance   Pt will benefit from skilled therapeutic intervention in order to improve on the following deficits Decreased activity tolerance;Decreased endurance;Decreased strength   Rehab Potential Good   PT Frequency 2x / week   PT Duration 6 weeks   PT Treatment/Interventions ADLs/Self Care Home Management;Therapeutic exercise;Therapeutic activities;Functional mobility training;Stair training;Neuromuscular re-education;Patient/family education   PT Next Visit Plan Continue to improve strength and endurance.   Consulted and Agree with Plan of Care Patient         Problem List There are no active problems to display for this patient.   NAUMANN-HOUEGNIFIO,Brittany Greene PTA 06/11/2015, 3:22 PM  [facility] [address] [place], [place], [address] Phone: [phone]   Fax:  [phone]  Name: Brittany Greene MRN: [id number] Date of Birth: [birth year]

## 2015-06-13 ENCOUNTER — Encounter: Payer: Self-pay | Admitting: Physical Therapy

## 2015-06-13 ENCOUNTER — Ambulatory Visit: Payer: Medicare Other | Admitting: Physical Therapy

## 2015-06-13 DIAGNOSIS — M6281 Muscle weakness (generalized): Secondary | ICD-10-CM

## 2015-06-13 DIAGNOSIS — Z7409 Other reduced mobility: Secondary | ICD-10-CM | POA: Diagnosis not present

## 2015-06-13 NOTE — Therapy (Addendum)
[facility] [address], [place], [place], [address] Phone: [phone]   Fax:  [phone]  Physical Therapy Treatment  Patient Details  Name: Brittany Greene MRN: [id number] Date of Birth: 1940/07/08 Referring Provider: Everardo Beals  Encounter Date: 06/13/2015      PT End of Session - 06/13/15 1452    Visit Number 4   Number of Visits 10   Date for PT Re-Evaluation 07/12/15   PT Start Time [id number]   PT Stop Time 1525   PT Time Calculation (min) 39 min   Activity Tolerance Patient tolerated treatment well   Behavior During Therapy [facility] for tasks assessed/performed      Past Medical History  Diagnosis Date  . Diabetes mellitus without complication ([place])   . Hypertension   . Cancer ([place])   . Breast cancer [facility])     Past Surgical History  Procedure Laterality Date  . Breast surgery    . Mastectomy Left 2003  . Thoracic aortic aneurysm repair  03/06/2015    There were no vitals filed for this visit.  Visit Diagnosis:  Muscle weakness (generalized) M62.81      Subjective Assessment - 06/13/15 1451    Subjective Continues to do well. Brought pictures of her gym to show [place] the equipment.    Currently in Pain? No/denies                         [facility] Adult PT Treatment/Exercise - 06/13/15 0001    Knee/Hip Exercises: Aerobic   Stationary Bike L3 x 10 min   Knee/Hip Exercises: Machines for Strengthening   Cybex Leg Press Seat # 7 bil 45# 2 x10   VC for control    Knee/Hip Exercises: Standing   Walking with Sports Cord 20#  forward/reverse backward/reverse x 10 each direction  supervision for safety   Knee/Hip Exercises: Seated   Sit to Sand 2 sets;10 reps  Red plyo ball overhead concurrent   Shoulder Exercises: Seated   Other Seated Exercises 3 way raises x 10   2#                  PT Short Term Goals - 06/11/15 1458    PT SHORT TERM GOAL #1   Title be independent  in initial HEP   Time 3   Period Weeks   Status On-going           PT Long Term Goals - 06/11/15 1458    PT LONG TERM GOAL #1   Title be independent in advanced HEP for strength and endurance   Time 6   Period Weeks   Status On-going   PT LONG TERM GOAL #2   Title verbalize and demonstrate understanding of safe progression of gym exercise for strength/endurance   Time 6   Period Weeks   Status On-going   PT LONG TERM GOAL #3   Title report a 50% improvement in endurance for community activity   Time 6   Period Weeks   Status On-going   PT LONG TERM GOAL #4   Title negotiate steps with step-over-step gait without fatigue at her daughter's house   Time 6   Period Weeks   Status On-going               Plan - 06/13/15 1513    Clinical Impression Statement Pt was able tolerate leg press today. this was more challenging than we anticipated.  It took some practicew to find good control eccentrically. She brought in pictures of her gym but they had not many things she could do. Now she will consider  looking inot Silver Sneakers.    Pt will benefit from skilled therapeutic intervention in order to improve on the following deficits Decreased activity tolerance;Decreased endurance;Decreased strength   Rehab Potential Good   PT Frequency 2x / week   PT Duration 6 weeks   PT Treatment/Interventions ADLs/Self Care Home Management;Therapeutic exercise;Therapeutic activities;Functional mobility training;Stair training;Neuromuscular re-education;Patient/family education   PT Next Visit Plan Continue to improve strength and endurance. Work on stairs.   Consulted and Agree with Plan of Care Patient        Problem List There are no active problems to display for this patient.   Brittany Greene, PTA 06/13/2015, 3:26 PM  [facility] [address], [place], [place], [address] Phone: [phone]   Fax:   [phone]  Name: Brittany Greene MRN: [id number] Date of Birth: 28-Jan-1941

## 2015-06-18 ENCOUNTER — Encounter: Payer: Self-pay | Admitting: Physical Therapy

## 2015-06-18 ENCOUNTER — Ambulatory Visit: Payer: Medicare Other | Admitting: Physical Therapy

## 2015-06-18 DIAGNOSIS — M6281 Muscle weakness (generalized): Secondary | ICD-10-CM

## 2015-06-18 DIAGNOSIS — Z7409 Other reduced mobility: Secondary | ICD-10-CM | POA: Diagnosis not present

## 2015-06-18 NOTE — Patient Instructions (Signed)
Scar Massage  Scar massage is done to improve the mobility of scar, decrease scar tissue from building up, reduce adhesions, and prevent Keloids from forming. Start scar massage after scabs have fallen off by themselves and no open areas. The first few weeks after surgery, it is normal for a scar to appear pink or red and slightly raised. Scars can itch or have areas of numbness. Some scars may be sensitive.   Direct Scar massage: after scar is healed, no opening, no scab 1.  Place pads of two fingers together directly on the scar starting at one end of the scar. Move the fingers up and down across the scar holding 5 seconds one direction.  Then go opposite direction hold 5 seconds.  2. Move over to the next section of the scar and repeat.  Work your way along the entire length of the scar.   3. Next make diagonal movements along the scar holding 5 seconds at one direction. 4. Next movement is side to side. 5. Do not rub fingers over the scar.  Instead keep firm pressure and move scar over the tissue it is on top   Scar Lift and Roll 12 weeks after surgery. 1. Pinch a small amount of the scar between your first two fingers and thumb.  2. Roll the scar between your fingers for 5 to 15 seconds. 3. Move along the scar and repeat until you have massaged the entire length of scar.   Stop the massage and call your doctor if you notice: 1. Increased redness 2. Bleeding from scar 3. Seepage coming from the scar 4. Scar is warmer and has increased pain   About Abdominal Massage  Abdominal massage, also called external colon massage, is a self-treatment circular massage technique that can reduce and eliminate gas and ease constipation. The colon naturally contracts in waves in a clockwise direction starting from inside the right hip, moving up toward the ribs, across the belly, and down inside the left hip.  When you perform circular abdominal massage, you help stimulate your colon's normal wave  pattern of movement called peristalsis.  It is most beneficial when done after eating.  Positioning You can practice abdominal massage with oil while lying down, or in the shower with soap.  Some people find that it is just as effective to do the massage through clothing while sitting or standing.  How to Massage Start by placing your finger tips or knuckles on your right side, just inside your hip bone.  . Make small circular movements while you move upward toward your rib cage.   . Once you reach the bottom right side of your rib cage, take your circular movements across to the left side of the bottom of your rib cage.  . Next, move downward until you reach the inside of your left hip bone.  This is the path your feces travel in your colon. . Continue to perform your abdominal massage in this pattern for 10 minutes each day.     You can apply as much pressure as is comfortable in your massage.  Start gently and build pressure as you continue to practice.  Notice any areas of pain as you massage; areas of slight pain may be relieved as you massage, but if you have areas of significant or intense pain, consult with your healthcare provider.  Other Considerations . General physical activity including bending and stretching can have a beneficial massage-like effect on the colon.  Deep breathing can  also stimulate the colon because breathing deeply activates the same nervous system that supplies the colon.   . Abdominal massage should always be used in combination with a bowel-conscious diet that is high in the proper type of fiber for you, fluids (primarily water), and a regular exercise program.  [facility] [address], [place], [place] [address] Phone # [phone] Fax [phone]

## 2015-06-18 NOTE — Therapy (Addendum)
[facility] [address] [place], [place], [address] Phone: [phone]   Fax:  [phone]  Physical Therapy Treatment  Patient Details  Name: Brittany Greene MRN: [id number] Date of Birth: 05/12/1941 Referring Provider: Everardo Beals  Encounter Date: 06/18/2015      PT End of Session - 06/18/15 1444    Visit Number 5   Number of Visits 10  medicare   Date for PT Re-Evaluation 07/12/15   PT Start Time 1400   PT Stop Time 1445   PT Time Calculation (min) 45 min   Activity Tolerance Patient tolerated treatment well   Behavior During Therapy [facility] for tasks assessed/performed      Past Medical History  Diagnosis Date  . Diabetes mellitus without complication ([place])   . Hypertension   . Cancer ([place])   . Breast cancer [facility])     Past Surgical History  Procedure Laterality Date  . Breast surgery    . Mastectomy Left 2003  . Thoracic aortic aneurysm repair  03/06/2015    There were no vitals filed for this visit.  Visit Diagnosis:   Muscle weakness (generalized) M62.81      Subjective Assessment - 06/18/15 1406    Subjective I had an upset stomach yesterday and still not fully recovered.    Pertinent History breast cancer with Lt mastectomy 2003, thoracic aneurysm repair 02/2015   How long can you walk comfortably? pt has not been pushing herself, fatigue with shopping in the community   Patient Stated Goals improve strength and endurance, learn gym exercises   Currently in Pain? No/denies                         [facility] Adult PT Treatment/Exercise - 06/18/15 0001    Ambulation/Gait   Stairs Yes   Stair Management Technique One rail Right;Alternating pattern   Number of Stairs 4  4 times   Self-Care   Self-Care Scar Mobilizations;Other Self-Care Comments   Scar Mobilizations to abdominal scar to keep mobility and redue abdominal discomfort   Other Self-Care Comments  abdominal  massage to reduce discomfort so patient has increased energy   Knee/Hip Exercises: Aerobic   Stationary Bike L3 x 10 min   Knee/Hip Exercises: Machines for Strengthening   Cybex Leg Press Seat # 7 bil 45# 2 x10   VC for control    Knee/Hip Exercises: Standing   Walking with Sports Cord 20#  forward/reverse backward/reverse x 10 each direction  supervision for safety   Knee/Hip Exercises: Seated   Sit to Sand 2 sets;10 reps  Red plyo ball overhead concurrent   Shoulder Exercises: Seated   Other Seated Exercises 3 way raises x 10   2#                PT Education - 06/18/15 1433    Education provided Yes   Education Details abdominal massage and scar tissue massage   Person(s) Educated Patient   Methods Explanation;Demonstration;Handout   Comprehension Returned demonstration;Verbalized understanding          PT Short Term Goals - 06/18/15 1416    PT SHORT TERM GOAL #1   Title be independent in initial HEP   Time 3   Period Weeks   Status Achieved           PT Long Term Goals - 06/18/15 1417    PT LONG TERM GOAL #1   Title be independent in  advanced HEP for strength and endurance   Time 6   Period Weeks   Status On-going  still learning HEP   PT LONG TERM GOAL #2   Title verbalize and demonstrate understanding of safe progression of gym exercise for strength/endurance   Time 6   Period Weeks   Status On-going   PT LONG TERM GOAL #3   Title report a 50% improvement in endurance for community activity   Time 6   Period Weeks   Status Achieved   PT LONG TERM GOAL #4   Title negotiate steps with step-over-step gait without fatigue at her daughter's house   Time 6   Period Weeks   Status On-going  has not been at her daughters               Plan - 06/18/15 1445    Clinical Impression Statement Patient is feeling like she is almost back to her regular energy level.  Patient did not feel great due to her stomach not feeling well.  She has had  stomach issues since the surgery. Patient learned ways to perform abdominal massage and scar massage to decrease discomfort and increase energy.  Patient felt better  after therapy. Patietn able to go up and down steps with no difficulty using one handrail with step over step pattern.  Patient is standing upright better. Patient  is independet with her current HEP and met STG #1.  Patient wil benfit from physical therapy to further increase her strength and endurance.    Pt will benefit from skilled therapeutic intervention in order to improve on the following deficits Decreased activity tolerance;Decreased endurance;Decreased strength   Rehab Potential Good   Clinical Impairments Affecting Rehab Potential None   PT Frequency 2x / week   PT Duration 6 weeks   PT Treatment/Interventions ADLs/Self Care Home Management;Therapeutic exercise;Therapeutic activities;Functional mobility training;Stair training;Neuromuscular re-education;Patient/family education   PT Next Visit Plan Continue to improve strength and endurance. Work on stairs.   PT Home Exercise Plan progress as needed   Recommended Other Services None   Consulted and Agree with Plan of Care Patient        Problem List There are no active problems to display for this patient.   Earlie Counts, PT 06/18/2015 2:55 PM    [facility] [address] [place], [place], [address] Phone: [phone]   Fax:  [phone]  Name: Brittany Greene MRN: [id number] Date of Birth: 10-Oct-1940

## 2015-06-20 ENCOUNTER — Encounter: Payer: Self-pay | Admitting: Physical Therapy

## 2015-06-20 ENCOUNTER — Ambulatory Visit: Payer: Medicare Other | Admitting: Physical Therapy

## 2015-06-20 DIAGNOSIS — M6281 Muscle weakness (generalized): Secondary | ICD-10-CM

## 2015-06-20 DIAGNOSIS — Z7409 Other reduced mobility: Secondary | ICD-10-CM | POA: Diagnosis not present

## 2015-06-20 NOTE — Therapy (Addendum)
[facility] [address], [place], [place], [address] Phone: [phone]   Fax:  [phone]  Physical Therapy Treatment  Patient Details  Name: Brittany Greene MRN: [id number] Date of Birth: 1941-02-28 Referring Provider: Everardo Beals  Encounter Date: 06/20/2015      PT End of Session - 06/20/15 1454    Visit Number 6   Number of Visits 10   Date for PT Re-Evaluation 07/12/15   PT Start Time [id number]   PT Stop Time 1526   PT Time Calculation (min) 38 min   Activity Tolerance Patient tolerated treatment well   Behavior During Therapy [facility] for tasks assessed/performed      Past Medical History  Diagnosis Date  . Diabetes mellitus without complication ([place])   . Hypertension   . Cancer ([place])   . Breast cancer [facility])     Past Surgical History  Procedure Laterality Date  . Breast surgery    . Mastectomy Left 2003  . Thoracic aortic aneurysm repair  03/06/2015    There were no vitals filed for this visit.   Diagnoses: Muscle weakness (generalized) M62.81      Subjective Assessment - 06/20/15 1454    Subjective Having a problem with constipation, finally was able to eliminate today and feels better.    Currently in Pain? No/denies   Multiple Pain Sites No                         [facility] Adult PT Treatment/Exercise - 06/20/15 0001    Knee/Hip Exercises: Aerobic   Stationary Bike L3 x 10 min   Knee/Hip Exercises: Machines for Strengthening   Cybex Leg Press seat 7, bil 45# 3x10   Knee/Hip Exercises: Standing   Rebounder 3 way weight shift 1 min   Walking with Sports Cord 20#  forward/reverse backward/reverse x 10 each direction  supervision for safety   Shoulder Exercises: Seated   Other Seated Exercises 3 way raises x 10   3#                  PT Short Term Goals - 06/18/15 1416    PT SHORT TERM GOAL #1   Title be independent in initial HEP   Time 3   Period Weeks    Status Achieved           PT Long Term Goals - 06/18/15 1417    PT LONG TERM GOAL #1   Title be independent in advanced HEP for strength and endurance   Time 6   Period Weeks   Status On-going  still learning HEP   PT LONG TERM GOAL #2   Title verbalize and demonstrate understanding of safe progression of gym exercise for strength/endurance   Time 6   Period Weeks   Status On-going   PT LONG TERM GOAL #3   Title report a 50% improvement in endurance for community activity   Time 6   Period Weeks   Status Achieved   PT LONG TERM GOAL #4   Title negotiate steps with step-over-step gait without fatigue at her daughter's house   Time 6   Period Weeks   Status On-going  has not been at her daughters               Plan - 06/20/15 1513    Clinical Impression Statement Constipation beginning to subside as she began taking Miralax. Stairs continue to improve and overall pt  reports feels like she is stronger.    Pt will benefit from skilled therapeutic intervention in order to improve on the following deficits Decreased activity tolerance;Decreased endurance;Decreased strength   Rehab Potential Good   Clinical Impairments Affecting Rehab Potential None   PT Frequency 2x / week   PT Duration 6 weeks   PT Treatment/Interventions ADLs/Self Care Home Management;Therapeutic exercise;Therapeutic activities;Functional mobility training;Stair training;Neuromuscular re-education;Patient/family education   PT Next Visit Plan Continue to improve strength and endurance. Work on stairs.   Consulted and Agree with Plan of Care Patient        Problem List There are no active problems to display for this patient.   Athalie Newhard , PTA  06/20/2015, 3:23 PM  [facility] [address] [place], [place], [address] Phone: [phone]   Fax:  [phone]  Name: Brittany Greene MRN: [id number] Date of Birth:  15-Oct-1940

## 2015-06-25 ENCOUNTER — Encounter: Payer: Self-pay | Admitting: Physical Therapy

## 2015-06-25 ENCOUNTER — Ambulatory Visit: Payer: Medicare Other | Admitting: Physical Therapy

## 2015-06-25 DIAGNOSIS — M6281 Muscle weakness (generalized): Secondary | ICD-10-CM

## 2015-06-25 DIAGNOSIS — Z7409 Other reduced mobility: Secondary | ICD-10-CM | POA: Diagnosis not present

## 2015-06-25 NOTE — Therapy (Addendum)
[facility] [address] [place], [place], [address] Phone: [phone]   Fax:  [phone]  Physical Therapy Treatment  Patient Details  Name: Brittany Greene MRN: [id number] Date of Birth: [birth year] Referring Provider: Everardo Beals  Encounter Date: 06/25/2015      PT End of Session - 06/25/15 1443    Visit Number 7   Number of Visits 10   Date for PT Re-Evaluation 07/12/15   PT Start Time 1440   PT Stop Time 1528   PT Time Calculation (min) 48 min   Activity Tolerance Patient tolerated treatment well   Behavior During Therapy [facility] for tasks assessed/performed      Past Medical History  Diagnosis Date  . Diabetes mellitus without complication ([place])   . Hypertension   . Cancer ([place])   . Breast cancer [facility])     Past Surgical History  Procedure Laterality Date  . Breast surgery    . Mastectomy Left 2003  . Thoracic aortic aneurysm repair  03/06/2015    There were no vitals filed for this visit.  Visit Diagnosis:  Muscle weakness (generalized) M62.81      Subjective Assessment - 06/25/15 1442    Subjective Feeling pretty good   Pertinent History breast cancer with Lt mastectomy 2003, thoracic aneurysm repair 02/2015   How long can you walk comfortably? pt has not been pushing herself, fatigue with shopping in the community   Patient Stated Goals improve strength and endurance, learn gym exercises   Currently in Pain? No/denies                         [facility] Adult PT Treatment/Exercise - 06/25/15 0001    Exercises   Exercises Knee/Hip;Shoulder   Knee/Hip Exercises: Aerobic   Stationary Bike L3 x 10 min   Knee/Hip Exercises: Machines for Strengthening   Cybex Leg Press seat #7, 50# bil 2x10, 45# 1 x 10   Knee/Hip Exercises: Standing   Forward Step Up Both;1 set;10 reps;Hand Hold: 2;Step Height: 6"  alternating, pt challenged to coordinate   Rebounder 3 way weight shift 1 min    Walking with Sports Cord 20#  forward/reverse backward/reverse x 10 each direction  increase weight to 25, supervision for safety   Other Standing Knee Exercises squating with red physioball 2 x 10    Shoulder Exercises: Seated   Other Seated Exercises 3 way raises x 10   3#                  PT Short Term Goals - 06/18/15 1416    PT SHORT TERM GOAL #1   Title be independent in initial HEP   Time 3   Period Weeks   Status Achieved           PT Long Term Goals - 06/25/15 1445    PT LONG TERM GOAL #1   Title be independent in advanced HEP for strength and endurance   Time 6   Period Weeks   Status On-going   PT LONG TERM GOAL #2   Title verbalize and demonstrate understanding of safe progression of gym exercise for strength/endurance   Time 6   Period Weeks   Status On-going   PT LONG TERM GOAL #3   Title report a 50% improvement in endurance for community activity   Time 6   Period Weeks   Status Achieved   PT LONG TERM GOAL #4  Title negotiate steps with step-over-step gait without fatigue at her daughter's house   Time 6   Period Weeks   Status On-going               Plan - 06/25/15 1444    Clinical Impression Statement Pt continues to improve with activities and shows progress toward all goals. Pt will continue to benefit from skilled Pt to improve overall functional strength and endurance   Pt will benefit from skilled therapeutic intervention in order to improve on the following deficits Decreased activity tolerance;Decreased endurance;Decreased strength   Rehab Potential Good   Clinical Impairments Affecting Rehab Potential None   PT Frequency 2x / week   PT Duration 6 weeks   PT Treatment/Interventions ADLs/Self Care Home Management;Therapeutic exercise;Therapeutic activities;Functional mobility training;Stair training;Neuromuscular re-education;Patient/family education   PT Next Visit Plan Increase weight with resisted walking to 25#.  Continue to improve strength and endurance. Add sidestepping on stairs.   PT Home Exercise Plan progress as needed   Consulted and Agree with Plan of Care --        Problem List There are no active problems to display for this patient.   NAUMANN-HOUEGNIFIO,Emmerson Taddei PTA 06/25/2015, 3:42 PM  [facility] [address], [place], [place], [address] Phone: [phone]   Fax:  [phone]  Name: Brittany Greene MRN: [id number] Date of Birth: [birth year]

## 2015-06-27 ENCOUNTER — Encounter: Payer: Medicare Other | Admitting: Physical Therapy

## 2015-06-28 ENCOUNTER — Ambulatory Visit: Payer: Medicare Other | Attending: *Deleted | Admitting: Physical Therapy

## 2015-06-28 DIAGNOSIS — M6281 Muscle weakness (generalized): Secondary | ICD-10-CM | POA: Diagnosis present

## 2015-06-28 DIAGNOSIS — Z7409 Other reduced mobility: Secondary | ICD-10-CM | POA: Insufficient documentation

## 2015-06-28 NOTE — Therapy (Addendum)
[facility] [address] [place], [place], [address] Phone: [phone]   Fax:  [phone]  Physical Therapy Treatment  Patient Details  Name: Brittany Greene MRN: [id number] Date of Birth: Mar 21, 1941 Referring Provider: Everardo Beals  Encounter Date: 06/28/2015      PT End of Session - 06/28/15 1450    Visit Number 8   Number of Visits 10   Date for PT Re-Evaluation 2015/07/27   Authorization Type G codes at 10th visit   PT Start Time [id number]   PT Stop Time 1530   PT Time Calculation (min) 45 min   Activity Tolerance Patient tolerated treatment well      Past Medical History  Diagnosis Date  . Diabetes mellitus without complication ([place])   . Hypertension   . Cancer ([place])   . Breast cancer [facility])     Past Surgical History  Procedure Laterality Date  . Breast surgery    . Mastectomy Left 2003  . Thoracic aortic aneurysm repair  03/06/2015    There were no vitals filed for this visit.  Visit Diagnosis:   Muscle weakness (generalized) M62.81      Subjective Assessment - 06/28/15 1446    Subjective When I start PT my legs feel jelly but it wears off after I get going on the bike.  Usually tired after PT but not too badly.    Currently in Pain? No/denies                         [facility] Adult PT Treatment/Exercise - 06/28/15 0001    Knee/Hip Exercises: Aerobic   Stationary Bike L3 x 10 min   Knee/Hip Exercises: Machines for Strengthening   Cybex Leg Press seat #7, 50# bilateral 2x10; single leg 30# 15x right/left   Knee/Hip Exercises: Standing   Lateral Step Up Right;Left;1 set;10 reps;Hand Hold: 2;Step Height: 6"  opposite hip abduction   Forward Step Up Both;1 set;10 reps;Hand Hold: 2;Step Height: 6"  with opposite hip flexion    Rebounder 3 way weight shift 1 min   Walking with Sports Cord 25#  forward/reverse backward/reverse x 10 each direction  increase weight to 25, supervision  for safety   Other Standing Knee Exercises squating with red physioball 2 x 10    Shoulder Exercises: Seated   Other Seated Exercises 3 way raises x 10   3#                  PT Short Term Goals - 06/28/15 1448    PT SHORT TERM GOAL #1   Title be independent in initial HEP   Status Achieved           PT Long Term Goals - 06/28/15 1449    PT LONG TERM GOAL #1   Title be independent in advanced HEP for strength and endurance   Time 6   Period Weeks   Status On-going   PT LONG TERM GOAL #2   Title verbalize and demonstrate understanding of safe progression of gym exercise for strength/endurance   Time 6   Period Weeks   Status On-going   PT LONG TERM GOAL #3   Title report a 50% improvement in endurance for community activity   Status Achieved   PT LONG TERM GOAL #4   Title negotiate steps with step-over-step gait without fatigue at her daughter's house   Time 6   Period Weeks   Status On-going  Plan - 06/28/15 1505    Clinical Impression Statement Patient is making steady progress with LE strengthening.  Therapist closely monitoring for pain, excessive fatigue and safety.  Progressing with long term goals.  She should meet remaining goals in next 2 weeks.   She plans to start with a low level gym program upon discharge from PT.     PT Next Visit Plan Continue with LE, UE amd core strengthening progression and endurance.          Problem List There are no active problems to display for this patient.   Alvera Singh 06/28/2015, 3:30 PM  [facility] [address], [place], [place], [address] Phone: [phone]   Fax:  [phone]  Name: Brittany Greene MRN: [id number] Date of Birth: [birth year]    Ruben Im, PT 06/28/2015 3:31 PM Phone: [phone] Fax: [phone]

## 2015-07-02 ENCOUNTER — Encounter: Payer: Self-pay | Admitting: Physical Therapy

## 2015-07-02 ENCOUNTER — Ambulatory Visit: Payer: Medicare Other | Admitting: Physical Therapy

## 2015-07-02 DIAGNOSIS — Z7409 Other reduced mobility: Secondary | ICD-10-CM | POA: Diagnosis not present

## 2015-07-02 DIAGNOSIS — M6281 Muscle weakness (generalized): Secondary | ICD-10-CM

## 2015-07-02 NOTE — Therapy (Addendum)
[facility] [address], [place], [place], [address] Phone: [phone]   Fax:  [phone]  Physical Therapy Treatment  Patient Details  Name: Brittany Greene MRN: [id number] Date of Birth: 03/02/1941 Referring Provider: Everardo Beals  Encounter Date: 07/02/2015      PT End of Session - 07/02/15 1403    Visit Number 9   Number of Visits 10   Date for PT Re-Evaluation 07/13/2015   Authorization Type G codes at 10th visit   PT Start Time 1358   PT Stop Time 1440   PT Time Calculation (min) 42 min   Activity Tolerance Patient tolerated treatment well   Behavior During Therapy [facility] for tasks assessed/performed      Past Medical History  Diagnosis Date  . Diabetes mellitus without complication ([place])   . Hypertension   . Cancer ([place])   . Breast cancer [facility])     Past Surgical History  Procedure Laterality Date  . Breast surgery    . Mastectomy Left 2003  . Thoracic aortic aneurysm repair  03/06/2015    There were no vitals filed for this visit.  Visit Diagnosis:  Muscle weakness (generalized) M62.81      Subjective Assessment - 07/02/15 1402    Subjective Feeling my legs are getting stronger and all over improved with endurance no complains of pain.   Pertinent History breast cancer with Lt mastectomy 2003, thoracic aneurysm repair 02/2015   Patient Stated Goals improve strength and endurance, learn gym exercises   Currently in Pain? No/denies                         [facility] Adult PT Treatment/Exercise - 07/02/15 0001    Exercises   Exercises Knee/Hip;Shoulder   Knee/Hip Exercises: Aerobic   Stationary Bike L3 x 10 min   Knee/Hip Exercises: Machines for Strengthening   Cybex Leg Press seat #7, 60# bilateral 3x10; single leg 30# 15x right/left  pt tolerated increase in weight well   Knee/Hip Exercises: Standing   Lateral Step Up Right;Left;10 reps;2 sets;Hand Hold: 1;Step Height: 8"    Forward Step Up Both;10 reps;Hand Hold: 2;2 sets;Step Height: 8"   Rebounder 3 way weight shift 1 min   Walking with Sports Cord 25#  forward/reverse backward/reverse x 10 each direction   Other Standing Knee Exercises squating with red physioball 1 x 10    Shoulder Exercises: Seated   Other Seated Exercises 3 way raises x 10   3#, use 2# next visit                  PT Short Term Goals - 06/28/15 1448    PT SHORT TERM GOAL #1   Title be independent in initial HEP   Status Achieved           PT Long Term Goals - 07/02/15 1410    PT LONG TERM GOAL #1   Title be independent in advanced HEP for strength and endurance   Time 6   Period Weeks   Status On-going   PT LONG TERM GOAL #2   Title verbalize and demonstrate understanding of safe progression of gym exercise for strength/endurance   Time 6   Period Weeks   Status On-going   PT LONG TERM GOAL #3   Title report a 50% improvement in endurance for community activity   Time 6   Period Weeks   Status Achieved   PT LONG  TERM GOAL #4   Title negotiate steps with step-over-step gait without fatigue at her daughter's house   Time 6   Period Weeks   Status On-going               Plan - 07/02/15 1406    Clinical Impression Statement Pt continues to progress with LE strengthening. Progressing toward LTG.    Pt will benefit from skilled therapeutic intervention in order to improve on the following deficits Decreased activity tolerance;Decreased endurance;Decreased strength   Rehab Potential Good   Clinical Impairments Affecting Rehab Potential None   PT Frequency 2x / week   PT Duration 6 weeks   PT Treatment/Interventions ADLs/Self Care Home Management;Therapeutic exercise;Therapeutic activities;Functional mobility training;Stair training;Neuromuscular re-education;Patient/family education   PT Next Visit Plan Continue with LE, UE and core strengthening progression and endurance.     PT Home Exercise Plan  progress as needed   Consulted and Agree with Plan of Care Patient        Problem List There are no active problems to display for this patient.   NAUMANN-HOUEGNIFIO,Kamika Goodloe PTA 07/02/2015, 2:51 PM  [facility] [address] [place], [place], [address] Phone: [phone]   Fax:  [phone]  Name: Brittany Greene MRN: [id number] Date of Birth: Jul 04, 1940

## 2015-07-04 ENCOUNTER — Ambulatory Visit: Payer: Medicare Other | Admitting: Physical Therapy

## 2015-07-04 ENCOUNTER — Encounter: Payer: Self-pay | Admitting: Physical Therapy

## 2015-07-04 DIAGNOSIS — Z7409 Other reduced mobility: Secondary | ICD-10-CM | POA: Diagnosis not present

## 2015-07-04 DIAGNOSIS — M6281 Muscle weakness (generalized): Secondary | ICD-10-CM

## 2015-07-04 NOTE — Therapy (Addendum)
[facility] [address] [place], [place], [address] Phone: [phone]   Fax:  [phone]  Physical Therapy Treatment  Patient Details  Name: Brittany Greene MRN: [id number] Date of Birth: July 05, 1940 Referring Provider: Everardo Beals  Encounter Date: 07/04/2015      PT End of Session - 07/04/15 1233    Visit Number 10   Number of Visits 10   Date for PT Re-Evaluation 2015/07/15   Authorization Type G codes at 10th visit   PT Start Time 1224   PT Stop Time 1312   PT Time Calculation (min) 48 min   Activity Tolerance Patient tolerated treatment well   Behavior During Therapy [facility] for tasks assessed/performed      Past Medical History  Diagnosis Date  . Diabetes mellitus without complication ([place])   . Hypertension   . Cancer ([place])   . Breast cancer [facility]     Past Surgical History  Procedure Laterality Date  . Breast surgery    . Mastectomy Left 2003  . Thoracic aortic aneurysm repair  03/06/2015    There were no vitals filed for this visit.  Visit Diagnosis:   Muscle weakness (generalized) M62.81      Subjective Assessment - 07/04/15 1234    Subjective No new complaints   Currently in Pain? No/denies   Multiple Pain Sites No                         [facility] Adult PT Treatment/Exercise - 07/04/15 0001    Knee/Hip Exercises: Aerobic   Stationary Bike L3 x 10 min   Knee/Hip Exercises: Machines for Strengthening   Cybex Leg Press Seat 7, bil 60# 3x10, single leg 30# 15x each    Knee/Hip Exercises: Standing   Lateral Step Up Right;Left;10 reps;2 sets;Hand Hold: 1;Step Height: 8"   Forward Step Up Both;10 reps;Hand Hold: 2;2 sets;Step Height: 8"   Rebounder 3 way weight shift 1 min   Walking with Sports Cord 25#  forward/reverse backward/reverse x 10 each direction                  PT Short Term Goals - 06/28/15 1448    PT SHORT TERM GOAL #1   Title be independent in  initial HEP   Status Achieved           PT Long Term Goals - 07/02/15 1410    PT LONG TERM GOAL #1   Title be independent in advanced HEP for strength and endurance   Time 6   Period Weeks   Status On-going   PT LONG TERM GOAL #2   Title verbalize and demonstrate understanding of safe progression of gym exercise for strength/endurance   Time 6   Period Weeks   Status On-going   PT LONG TERM GOAL #3   Title report a 50% improvement in endurance for community activity   Time 6   Period Weeks   Status Achieved   PT LONG TERM GOAL #4   Title negotiate steps with step-over-step gait without fatigue at her daughter's house   Time 6   Period Weeks   Status On-going               Plan - 07/04/15 1245    Clinical Impression Statement pt is planning to join [employer] [place] when she discharges next week. Today she reports she isn't sure what limitations she still has.   Pt will benefit  from skilled therapeutic intervention in order to improve on the following deficits Decreased activity tolerance;Decreased endurance;Decreased strength   Rehab Potential Good   Clinical Impairments Affecting Rehab Potential None   PT Frequency 2x / week   PT Duration 6 weeks   PT Treatment/Interventions ADLs/Self Care Home Management;Therapeutic exercise;Therapeutic activities;Functional mobility training;Stair training;Neuromuscular re-education;Patient/family education   PT Next Visit Plan DC next week, 07/22/15.   Consulted and Agree with Plan of Care Patient          G-Codes - 07/27/15 1307    Functional Assessment Tool Used Clinical judgement   Functional Limitation Other PT primary   Other PT Primary Current Status [id number]) At least 1 percent but less than 20 percent impaired, limited or restricted   Other PT Primary Goal Status [id number]) At least 1 percent but less than 20 percent impaired, limited or restricted      Problem List There are no active problems to display for this  patient.   COCHRAN,JENNIFER, PTA 27-Jul-2015, 1:12 PM  [facility] [address] [place], [place], [address] Phone: [phone]   Fax:  [phone]  Name: Brittany Greene MRN: [id number] Date of Birth: [birth year]

## 2015-07-09 ENCOUNTER — Ambulatory Visit: Payer: Medicare Other

## 2015-07-09 DIAGNOSIS — Z7409 Other reduced mobility: Secondary | ICD-10-CM | POA: Diagnosis not present

## 2015-07-09 DIAGNOSIS — M6281 Muscle weakness (generalized): Secondary | ICD-10-CM

## 2015-07-09 NOTE — Therapy (Addendum)
[facility] [address] [place], [place], [address] Phone: [phone]   Fax:  [phone]  Physical Therapy Treatment  Patient Details  Name: Brittany Greene MRN: [id number] Date of Birth: Mar 12, 1941 Referring Provider: Everardo Beals  Encounter Date: 07/09/2015      PT End of Session - 07/09/15 1531    Visit Number 11   Number of Visits 20   Date for PT Re-Evaluation 07/12/15   PT Start Time 1457  Pt late for appt   PT Stop Time 1532   PT Time Calculation (min) 35 min   Activity Tolerance Patient tolerated treatment well   Behavior During Therapy [facility] for tasks assessed/performed      Past Medical History  Diagnosis Date  . Diabetes mellitus without complication ([place])   . Hypertension   . Cancer ([place])   . Breast cancer [facility])     Past Surgical History  Procedure Laterality Date  . Breast surgery    . Mastectomy Left 2003  . Thoracic aortic aneurysm repair  03/06/2015    There were no vitals filed for this visit.  Visit Diagnosis:  Muscle weakness (generalized) M62.81      Subjective Assessment - 07/09/15 1456    Subjective No new complaints.   Patient Stated Goals improve strength and endurance, learn gym exercises   Currently in Pain? No/denies                         [facility] Adult PT Treatment/Exercise - 07/09/15 0001    Knee/Hip Exercises: Aerobic   Stationary Bike L3 x 10 min   Knee/Hip Exercises: Machines for Strengthening   Cybex Leg Press Seat 7, bil 60# 3x10, single leg 30# 15x each    Knee/Hip Exercises: Standing   Lateral Step Up Right;Left;10 reps;Hand Hold: 1;Step Height: 8";1 set   Forward Step Up Both;10 reps;Hand Hold: 2;Step Height: 8";1 set   Rebounder 3 way weight shift 1 min   Walking with Sports Cord 25#  forward/reverse backward/reverse x 10 each direction                  PT Short Term Goals - 06/28/15 1448    PT SHORT TERM GOAL #1   Title be independent in initial HEP   Status Achieved           PT Long Term Goals - 07/02/15 1410    PT LONG TERM GOAL #1   Title be independent in advanced HEP for strength and endurance   Time 6   Period Weeks   Status On-going   PT LONG TERM GOAL #2   Title verbalize and demonstrate understanding of safe progression of gym exercise for strength/endurance   Time 6   Period Weeks   Status On-going   PT LONG TERM GOAL #3   Title report a 50% improvement in endurance for community activity   Time 6   Period Weeks   Status Achieved   PT LONG TERM GOAL #4   Title negotiate steps with step-over-step gait without fatigue at her daughter's house   Time 6   Period Weeks   Status On-going               Plan - 07/09/15 1459    Clinical Impression Statement Pt plans to start Silver Sneakers after discharge from PT this week. Pt with increased fatigue today.   Pt will benefit from 1 more PT session to  finalize gym exercises for endurance gains after discharge.     Pt will benefit from skilled therapeutic intervention in order to improve on the following deficits Decreased activity tolerance;Decreased endurance;Decreased strength   Rehab Potential Good   PT Frequency 2x / week   PT Duration 6 weeks   PT Treatment/Interventions ADLs/Self Care Home Management;Therapeutic exercise;Therapeutic activities;Functional mobility training;Stair training;Neuromuscular re-education;Patient/family education   PT Next Visit Plan d/C PT next session   Consulted and Agree with Plan of Care Patient        Problem List There are no active problems to display for this patient.   Mallery Harshman, PT 07/09/2015, 3:33 PM  [facility] [address] [place], [place], [address] Phone: [phone]   Fax:  [phone]  Name: Brittany Greene MRN: [id number] Date of Birth: 03/10/1941

## 2015-07-12 ENCOUNTER — Ambulatory Visit: Payer: Medicare Other

## 2015-07-12 DIAGNOSIS — M6281 Muscle weakness (generalized): Secondary | ICD-10-CM

## 2015-07-12 DIAGNOSIS — Z7409 Other reduced mobility: Secondary | ICD-10-CM | POA: Diagnosis not present

## 2015-07-12 NOTE — Therapy (Addendum)
[facility] [address], [place], [place], [address] Phone: [phone]   Fax:  [phone]  Physical Therapy Treatment  Patient Details  Name: Brittany Greene MRN: [id number] Date of Birth: 1940-12-11 Referring Provider: Everardo Beals  Encounter Date: 07/12/2015      PT End of Session - 07/12/15 1515    Visit Number 12   PT Start Time 6384   PT Stop Time 1516   PT Time Calculation (min) 38 min   Activity Tolerance Patient tolerated treatment well   Behavior During Therapy [facility] for tasks assessed/performed      Past Medical History  Diagnosis Date  . Diabetes mellitus without complication ([place])   . Hypertension   . Cancer ([place])   . Breast cancer [facility])     Past Surgical History  Procedure Laterality Date  . Breast surgery    . Mastectomy Left 2003  . Thoracic aortic aneurysm repair  03/06/2015    There were no vitals filed for this visit.  Visit Diagnosis:   Muscle weakness (generalized) M62.81      Subjective Assessment - 07/12/15 1448    Subjective Ready for D/C. Will go to [employer] classes after discharge.     Pertinent History breast cancer with Lt mastectomy 2003, thoracic aneurysm repair 02/2015   Currently in Pain? No/denies            [facility] PT Assessment - 07/12/15 0001    Assessment   Medical Diagnosis weakness (generalized)   Onset Date/Surgical Date 03/06/15   Precautions   Precautions Other (comment)  history of breast cancer: no [place] or arm bike   Prior Function   Level of Independence Independent   Vocation Retired   Associate Professor   Overall Cognitive Status Within Functional Limits for tasks assessed                     [facility] Adult PT Treatment/Exercise - 07/12/15 0001    Knee/Hip Exercises: Aerobic   Stationary Bike L3 x 8 min   Knee/Hip Exercises: Machines for Strengthening   Cybex Leg Press Seat 7, bil 60# 3x10, single leg 30# 15x each    Knee/Hip  Exercises: Standing   Lateral Step Up Right;Left;10 reps;Hand Hold: 1;Step Height: 8";1 set   Forward Step Up Both;10 reps;Hand Hold: 2;Step Height: 8";1 set   Rebounder 3 way weight shift 1 min   Walking with Sports Cord 25#  forward/reverse backward/reverse x 10 each direction                  PT Short Term Goals - 06/28/15 1448    PT SHORT TERM GOAL #1   Title be independent in initial HEP   Status Achieved           PT Long Term Goals - 07/12/15 1449    PT LONG TERM GOAL #1   Title be independent in advanced HEP for strength and endurance   Status Achieved   PT LONG TERM GOAL #2   Title verbalize and demonstrate understanding of safe progression of gym exercise for strength/endurance   Status Achieved   PT LONG TERM GOAL #3   Title report a 50% improvement in endurance for community activity   Status Achieved   PT LONG TERM GOAL #4   Title negotiate steps with step-over-step gait without fatigue at her daughter's house   Status Achieved  Plan - 07/21/15 1452    Clinical Impression Statement Pt reports 90-95% overall improvement in endurance and strength since the start of care.  Pt has met all goals and will D/C to gym and HEP.     PT Next Visit Plan D/C PT to HEP   Consulted and Agree with Plan of Care Patient          G-Codes - Jul 21, 2015 1501    Functional Assessment Tool Used Clinical Judgement   Functional Limitation Other PT primary   Other PT Primary Goal Status (U8347) At least 1 percent but less than 20 percent impaired, limited or restricted   Other PT Primary Discharge Status (H8307) At least 1 percent but less than 20 percent impaired, limited or restricted      Problem List There are no active problems to display for this patient.  PHYSICAL THERAPY DISCHARGE SUMMARY  Visits from Start of Care: 12  Current functional level related to goals / functional outcomes: Pt reports 90-95% overall improvement in endurance  and strength since the start of care.  Pt will transition to gym HEP.     Remaining deficits: No deficits remain.     Education / Equipment: HEP Plan: Patient agrees to discharge.  Patient goals were met. Patient is being discharged due to meeting the stated rehab goals.  ?????     Jaxen Samples, PT Jul 21, 2015, 3:20 PM  [facility] [facility] [address], [place], [place], [address] Phone: [phone]   Fax:  [phone]  Name: Brittany Greene MRN: [id number] Date of Birth: Sep 29, 1940

## 2015-09-27 NOTE — Addendum Note (Signed)
Addended by: Danie Binder on: 09/27/2015 02:06 PM   Modules accepted: Orders

## 2016-01-24 ENCOUNTER — Other Ambulatory Visit: Payer: Self-pay | Admitting: *Deleted

## 2016-01-24 DIAGNOSIS — Z1231 Encounter for screening mammogram for malignant neoplasm of breast: Secondary | ICD-10-CM

## 2016-01-25 ENCOUNTER — Other Ambulatory Visit: Payer: Self-pay | Admitting: *Deleted

## 2016-01-25 DIAGNOSIS — M810 Age-related osteoporosis without current pathological fracture: Secondary | ICD-10-CM

## 2016-03-04 ENCOUNTER — Ambulatory Visit
Admission: RE | Admit: 2016-03-04 | Discharge: 2016-03-04 | Disposition: A | Discharge status: Home / Self Care | Payer: Medicare Other | Source: Ambulatory Visit | Attending: *Deleted | Admitting: *Deleted

## 2016-03-04 DIAGNOSIS — M810 Age-related osteoporosis without current pathological fracture: Secondary | ICD-10-CM

## 2016-03-04 DIAGNOSIS — Z1231 Encounter for screening mammogram for malignant neoplasm of breast: Secondary | ICD-10-CM

## 2016-07-09 DIAGNOSIS — I716 Thoracoabdominal aortic aneurysm, without rupture, unspecified: Secondary | ICD-10-CM | POA: Insufficient documentation

## 2016-07-21 ENCOUNTER — Ambulatory Visit (HOSPITAL_COMMUNITY): Payer: Self-pay | Admitting: Plastic Surgery

## 2016-07-21 ENCOUNTER — Other Ambulatory Visit (HOSPITAL_COMMUNITY): Payer: Self-pay | Admitting: Plastic Surgery

## 2016-07-25 ENCOUNTER — Encounter (HOSPITAL_COMMUNITY): Payer: Self-pay

## 2016-07-25 ENCOUNTER — Encounter (HOSPITAL_COMMUNITY)
Admission: RE | Admit: 2016-07-25 | Discharge: 2016-07-25 | Disposition: A | Discharge status: Home / Self Care | Payer: Medicare Other | Source: Ambulatory Visit | Attending: Plastic Surgery | Admitting: Plastic Surgery

## 2016-07-25 DIAGNOSIS — C50919 Malignant neoplasm of unspecified site of unspecified female breast: Secondary | ICD-10-CM | POA: Diagnosis not present

## 2016-07-25 DIAGNOSIS — E119 Type 2 diabetes mellitus without complications: Secondary | ICD-10-CM | POA: Diagnosis not present

## 2016-07-25 DIAGNOSIS — I719 Aortic aneurysm of unspecified site, without rupture: Secondary | ICD-10-CM | POA: Insufficient documentation

## 2016-07-25 DIAGNOSIS — D649 Anemia, unspecified: Secondary | ICD-10-CM | POA: Insufficient documentation

## 2016-07-25 DIAGNOSIS — I10 Essential (primary) hypertension: Secondary | ICD-10-CM | POA: Diagnosis not present

## 2016-07-25 DIAGNOSIS — Z01812 Encounter for preprocedural laboratory examination: Secondary | ICD-10-CM | POA: Diagnosis present

## 2016-07-25 DIAGNOSIS — Z87891 Personal history of nicotine dependence: Secondary | ICD-10-CM | POA: Diagnosis not present

## 2016-07-25 HISTORY — DX: Anemia, unspecified: D64.9

## 2016-07-25 LAB — CBC
HEMATOCRIT: 33.9 % — AB (ref 36.0–46.0)
Hemoglobin: 10.6 g/dL — ABNORMAL LOW (ref 12.0–15.0)
MCH: 25.6 pg — AB (ref 26.0–34.0)
MCHC: 31.3 g/dL (ref 30.0–36.0)
MCV: 81.9 fL (ref 78.0–100.0)
Platelets: 260 10*3/uL (ref 150–400)
RBC: 4.14 MIL/uL (ref 3.87–5.11)
RDW: 14.3 % (ref 11.5–15.5)
WBC: 6.5 10*3/uL (ref 4.0–10.5)

## 2016-07-25 LAB — BASIC METABOLIC PANEL
Anion gap: 9 (ref 5–15)
BUN: 17 mg/dL (ref 6–20)
CALCIUM: 9.5 mg/dL (ref 8.9–10.3)
CO2: 28 mmol/L (ref 22–32)
CREATININE: 1.08 mg/dL — AB (ref 0.44–1.00)
Chloride: 102 mmol/L (ref 101–111)
GFR calc Af Amer: 56 mL/min — ABNORMAL LOW (ref >60)
GFR calc non Af Amer: 49 mL/min — ABNORMAL LOW (ref >60)
GLUCOSE: 94 mg/dL (ref 65–99)
Potassium: 3.1 mmol/L — ABNORMAL LOW (ref 3.5–5.1)
Sodium: 139 mmol/L (ref 135–145)

## 2016-07-25 LAB — GLUCOSE, CAPILLARY: Glucose-Capillary: 102 mg/dL — ABNORMAL HIGH (ref 65–99)

## 2016-07-25 NOTE — Pre-Procedure Instructions (Signed)
[name]  07/25/2016      [id number] [place], [place] [address] [address] [place] [address] Phone: [phone] Fax: [phone]    Your procedure is scheduled on Thursday, March 8th   Report to [facility] Admitting at 5:30 AM             (posted surgery time 7:30 - 10:00 am)   Call this number if you have problems the [facility] of surgery:  [phone], otherwise, you can call [phone] Mon - Fri 8am - 4 pm   Remember:  Do not eat food or drink liquids after midnight, Wednesday.    Take these medicines the morning of surgery with A SIP OF WATER : Norvasc, Metoprolol               4-5 days prior to surgery, STOP taking any Vitamins, Herbal Supplements, Anti-inflammatories.   Do not wear jewelry, make-up or nail polish.  Do not wear lotions, powders,  perfumes, or deoderant.  Do not shave underarms & legs 48 hours prior to surgery.     Do not bring valuables to the hospital.  [facility] is not responsible for any belongings or valuables.  Contacts, dentures or bridgework may not be worn into surgery.  Leave your suitcase in the car.  After surgery it may be brought to your room.  For patients admitted to the hospital, discharge time will be determined by your treatment team.  Please read over the following fact sheets that you were given. Pain Booklet and Surgical Site Infection Prevention         How to Manage Your Diabetes Before and After Surgery  Why is it important to control my blood sugar before and after surgery? . Improving blood sugar levels before and after surgery helps healing and can limit problems. . A way of improving blood sugar control is eating a healthy diet by: o  Eating less sugar and carbohydrates o  Increasing activity/exercise o  Talking with your doctor about reaching your blood sugar goals . High blood sugars (greater than 180 mg/dL) can raise  your risk of infections and slow your recovery, so you will need to focus on controlling your diabetes during the weeks before surgery. . Make sure that the doctor who takes care of your diabetes knows about your planned surgery including the date and location.  How do I manage my blood sugar before surgery? . Check your blood sugar at least 4 times a day, starting 2 days before surgery, to make sure that the level is not too high or low. o Check your blood sugar the morning of your surgery when you wake up and every 2 hours until you get to the Short Stay unit. o  . If your blood sugar is less than 70 mg/dL, you will need to treat for low blood sugar: o Do not take insulin. o Treat a low blood sugar (less than 70 mg/dL) with  cup of clear juice (cranberry or apple), 4 glucose tablets, OR glucose gel. o  o Recheck blood sugar in 15 minutes after treatment (to make sure it is greater than 70 mg/dL). If your blood sugar is not greater than 70 mg/dL on recheck, call [phone] for further instructions. . Report your blood sugar to the short stay nurse when you get to Short Stay.  . If you are admitted to the hospital after surgery: o Your  blood sugar will be checked by the staff and you will probably be given insulin after surgery (instead of oral diabetes medicines) to make sure you have good blood sugar levels. o The goal for blood sugar control after surgery is 80-180 mg/dL.   WHAT DO I DO ABOUT MY DIABETES MEDICATION?   Marland Kitchen Do not take oral diabetes medicines (pills) the morning of surgery.   Other Instructions:          Patient Signature:  Date:   Nurse Signature:  Date:   Reviewed and Endorsed by [facility] Patient Education Committee, August 2015

## 2016-07-25 NOTE — Progress Notes (Addendum)
PCP is Dr. Laurian Brim  LOV 04/2016 Is type 2 diabetic, who doesn't check her sugars often.  Unable to tell me what her morning sugars run. A1C sample drawn today. H/o aneurysm, which was repaired by Dr. Dwaine Deter in [place].02/2015      [phone] Has been back to see this doctor and a Dr. Lisbeth Ply in [place]. Has mentioned, "i have several more small aneurysms though" She said that these doctors have sent clearance note to Dr. Harlow Mares.  His office closes early on Friday, however, Ebony Hail was able to obtain from fax. Hopefully will be coming back in on Monday to PAT so another EKG can be done.

## 2016-07-26 LAB — HEMOGLOBIN A1C
Hgb A1c MFr Bld: 6.2 % — ABNORMAL HIGH (ref 4.8–5.6)
Mean Plasma Glucose: 131 mg/dL

## 2016-07-28 DIAGNOSIS — Z01812 Encounter for preprocedural laboratory examination: Secondary | ICD-10-CM | POA: Diagnosis not present

## 2016-07-28 DIAGNOSIS — E119 Type 2 diabetes mellitus without complications: Secondary | ICD-10-CM | POA: Diagnosis not present

## 2016-07-28 DIAGNOSIS — C50919 Malignant neoplasm of unspecified site of unspecified female breast: Secondary | ICD-10-CM | POA: Diagnosis not present

## 2016-07-28 DIAGNOSIS — I10 Essential (primary) hypertension: Secondary | ICD-10-CM | POA: Diagnosis not present

## 2016-07-28 NOTE — Progress Notes (Signed)
Anesthesia Chart Review:  Pt is a 76 year old female scheduled for L breast total capsulectomy, removed implant with delayed reconstruction with placement with silicone gel implants; right mastopexy on 07/31/2016 with Crissie Reese, MD.   - CT surgeon is Dwaine Deter, MD, last office visit 06/04/16; transverse aortic arch aneurysm stable, f/u in 1 year recommended (notes in care everywhere)  - Vascular surgeon is Ander Gaster, MD, last office visit 07/08/16 (notes in care everywhere). No indication at last office visit for intervention on descending thoracic aorta or iliac aneurysms. Pt to have repeat CT in 6 months; it is anticipated she will need a TEVAR.  Dr. Chipper Herb cleared pt for surgery from a vascular standpoint.   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 Former smoker. BMI 27. S/p replacement ascending aorta 0000000 [facility]) complicated by post-op atrial fibrillation converted to NSR with DCCV.   Medications include: Amlodipine, ASA 81 mg, lisinopril, metformin, metoprolol, simvastatin  Preoperative labs reviewed.  HbA1c 6.2, glucose 94.   EKG 07/28/16: NSR. Moderate voltage criteria for LVH, may be normal variant. T wave abnormality, consider lateral ischemia. Appears stable with EKG 11/30/14.   TEE 03/06/15 (care everywhere): LV function unchanged with nl EF. Trace AI. Graft visualized with laminar flow noted.  Cardiac cath 01/26/15 (01/26/15):  LM: normal LAD: mild luminal irregularities with maximum 20-30% occlusion of LAD.  CX: no significant disease RCA: mild luminal irregularities  Echo 01/10/15 (care everywhere):  - LVH. Ejection fraction>55%.  DiastolicLV dysfunction - Dilatedleft atrium - Dilatedthoracic aorta - Aorticregurgitation (mild) - Normalright ventricularcontractile performance -  Tricuspidregurgitation(mild)  Reviewed case with Dr. Gifford Shave.   If no changes, I anticipate pt can proceed with surgery as scheduled.   Willeen Cass, FNP-BC [facility] Stay [facility]iology Phone: [phone] 07/28/2016 4:15 PM

## 2016-07-30 ENCOUNTER — Encounter (HOSPITAL_COMMUNITY): Payer: Self-pay | Admitting: Anesthesiology

## 2016-07-30 NOTE — Anesthesia Preprocedure Evaluation (Addendum)
Anesthesia Evaluation  Patient identified by MRN, date of birth, ID band Patient awake    Reviewed: Allergy & Precautions, NPO status , Patient's Chart, lab work & pertinent test results  Airway Mallampati: II       Dental  (+) Lower Dentures, Upper Dentures   Pulmonary neg pulmonary ROS, former smoker,    Pulmonary exam normal        Cardiovascular hypertension, Pt. on medications Normal cardiovascular exam     Neuro/Psych    GI/Hepatic negative GI ROS, Neg liver ROS,   Endo/Other  diabetes, Type 2, Oral Hypoglycemic Agents  Renal/GU negative Renal ROS     Musculoskeletal negative musculoskeletal ROS (+)   Abdominal Normal abdominal exam  (+)   Peds  Hematology   Anesthesia Other Findings   Reproductive/Obstetrics                          Anesthesia Physical Anesthesia Plan  ASA: II  Anesthesia Plan: General   Post-op Pain Management:    Induction: Intravenous  Airway Management Planned: Oral ETT  Additional Equipment:   Intra-op Plan:   Post-operative Plan: Extubation in OR  Informed Consent: I have reviewed the patients History and Physical, chart, labs and discussed the procedure including the risks, benefits and alternatives for the proposed anesthesia with the patient or authorized representative who has indicated his/her understanding and acceptance.     Plan Discussed with: CRNA and Surgeon  Anesthesia Plan Comments:       Anesthesia Quick Evaluation

## 2016-07-31 ENCOUNTER — Ambulatory Visit (HOSPITAL_COMMUNITY): Payer: Medicare Other | Admitting: Anesthesiology

## 2016-07-31 ENCOUNTER — Ambulatory Visit (HOSPITAL_COMMUNITY)
Admission: RE | Admit: 2016-07-31 | Discharge: 2016-08-01 | Disposition: A | Discharge status: Home / Self Care | Payer: Medicare Other | Source: Ambulatory Visit | Attending: Plastic Surgery | Admitting: Plastic Surgery

## 2016-07-31 ENCOUNTER — Encounter (HOSPITAL_COMMUNITY)
Admission: RE | Disposition: A | Discharge status: Home / Self Care | Payer: Self-pay | Source: Ambulatory Visit | Attending: Plastic Surgery

## 2016-07-31 ENCOUNTER — Encounter (HOSPITAL_COMMUNITY): Payer: Self-pay | Admitting: General Practice

## 2016-07-31 ENCOUNTER — Ambulatory Visit (HOSPITAL_COMMUNITY): Payer: Medicare Other | Admitting: Vascular Surgery

## 2016-07-31 DIAGNOSIS — Z853 Personal history of malignant neoplasm of breast: Secondary | ICD-10-CM | POA: Diagnosis not present

## 2016-07-31 DIAGNOSIS — T85898A Other specified complication of other internal prosthetic devices, implants and grafts, initial encounter: Secondary | ICD-10-CM | POA: Insufficient documentation

## 2016-07-31 DIAGNOSIS — I082 Rheumatic disorders of both aortic and tricuspid valves: Secondary | ICD-10-CM | POA: Insufficient documentation

## 2016-07-31 DIAGNOSIS — Z7984 Long term (current) use of oral hypoglycemic drugs: Secondary | ICD-10-CM | POA: Diagnosis not present

## 2016-07-31 DIAGNOSIS — D649 Anemia, unspecified: Secondary | ICD-10-CM | POA: Diagnosis not present

## 2016-07-31 DIAGNOSIS — Z79899 Other long term (current) drug therapy: Secondary | ICD-10-CM | POA: Insufficient documentation

## 2016-07-31 DIAGNOSIS — Z7982 Long term (current) use of aspirin: Secondary | ICD-10-CM | POA: Insufficient documentation

## 2016-07-31 DIAGNOSIS — X58XXXA Exposure to other specified factors, initial encounter: Secondary | ICD-10-CM | POA: Diagnosis not present

## 2016-07-31 DIAGNOSIS — I10 Essential (primary) hypertension: Secondary | ICD-10-CM | POA: Diagnosis not present

## 2016-07-31 DIAGNOSIS — N6481 Ptosis of breast: Secondary | ICD-10-CM | POA: Diagnosis not present

## 2016-07-31 DIAGNOSIS — Z87891 Personal history of nicotine dependence: Secondary | ICD-10-CM | POA: Insufficient documentation

## 2016-07-31 DIAGNOSIS — I712 Thoracic aortic aneurysm, without rupture: Secondary | ICD-10-CM | POA: Insufficient documentation

## 2016-07-31 DIAGNOSIS — Z95828 Presence of other vascular implants and grafts: Secondary | ICD-10-CM | POA: Insufficient documentation

## 2016-07-31 DIAGNOSIS — N650 Deformity of reconstructed breast: Secondary | ICD-10-CM | POA: Diagnosis not present

## 2016-07-31 DIAGNOSIS — I723 Aneurysm of iliac artery: Secondary | ICD-10-CM | POA: Diagnosis not present

## 2016-07-31 DIAGNOSIS — E119 Type 2 diabetes mellitus without complications: Secondary | ICD-10-CM | POA: Diagnosis not present

## 2016-07-31 HISTORY — PX: BREAST CAPSULECTOMY WITH IMPLANT EXCHANGE: SHX5592

## 2016-07-31 HISTORY — DX: Personal history of other medical treatment: Z92.89

## 2016-07-31 HISTORY — PX: MASTOPEXY: SHX5358

## 2016-07-31 HISTORY — DX: Pneumonia, unspecified organism: J18.9

## 2016-07-31 HISTORY — PX: MASTOPEXY: SUR857

## 2016-07-31 HISTORY — DX: Malignant neoplasm of unspecified site of left female breast: C50.912

## 2016-07-31 HISTORY — PX: BREAST RECONSTRUCTION: SHX9

## 2016-07-31 HISTORY — DX: Pure hypercholesterolemia, unspecified: E78.00

## 2016-07-31 HISTORY — DX: Type 2 diabetes mellitus without complications: E11.9

## 2016-07-31 LAB — GLUCOSE, CAPILLARY
GLUCOSE-CAPILLARY: 127 mg/dL — AB (ref 65–99)
Glucose-Capillary: 125 mg/dL — ABNORMAL HIGH (ref 65–99)
Glucose-Capillary: 115 mg/dL — ABNORMAL HIGH (ref 65–99)
Glucose-Capillary: 133 mg/dL — ABNORMAL HIGH (ref 65–99)

## 2016-07-31 SURGERY — CAPSULECTOMY, BREAST, WITH REPLACEMENT OF IMPLANT
Anesthesia: General | Laterality: Right

## 2016-07-31 MED ORDER — CHLORHEXIDINE GLUCONATE CLOTH 2 % EX PADS: 6.0000 | MEDICATED_PAD | Freq: Once | CUTANEOUS | Status: DC

## 2016-07-31 MED ORDER — CEFAZOLIN IN D5W 1 GM/50ML IV SOLN
1.0000 g | Freq: Three times a day (TID) | INTRAVENOUS | Status: DC
  Administered 2016-07-31 – 2016-08-01 (×3): 1 g via INTRAVENOUS
  Filled 2016-07-31 (×4): qty 50

## 2016-07-31 MED ORDER — METHOCARBAMOL 500 MG PO TABS
500.0000 mg | ORAL_TABLET | Freq: Four times a day (QID) | ORAL | Status: DC | PRN
  Filled 2016-07-31: qty 1

## 2016-07-31 MED ORDER — FENTANYL CITRATE (PF) 100 MCG/2ML IJ SOLN
INTRAMUSCULAR | Status: AC
  Filled 2016-07-31: qty 4

## 2016-07-31 MED ORDER — LIDOCAINE 2% (20 MG/ML) 5 ML SYRINGE
INTRAMUSCULAR | Status: AC
  Filled 2016-07-31: qty 5

## 2016-07-31 MED ORDER — PHENYLEPHRINE HCL 10 MG/ML IJ SOLN
INTRAVENOUS | Status: DC | PRN
  Administered 2016-07-31: 50 ug/min via INTRAVENOUS

## 2016-07-31 MED ORDER — HYDROMORPHONE HCL 1 MG/ML IJ SOLN
0.2500 mg | INTRAMUSCULAR | Status: DC | PRN
  Administered 2016-07-31: 0.5 mg via INTRAVENOUS

## 2016-07-31 MED ORDER — SUGAMMADEX SODIUM 200 MG/2ML IV SOLN
INTRAVENOUS | Status: AC
  Filled 2016-07-31: qty 2

## 2016-07-31 MED ORDER — DOCUSATE SODIUM 100 MG PO CAPS
100.0000 mg | ORAL_CAPSULE | Freq: Every day | ORAL | Status: DC
  Filled 2016-07-31: qty 1

## 2016-07-31 MED ORDER — PROMETHAZINE HCL 25 MG/ML IJ SOLN
6.2500 mg | INTRAMUSCULAR | Status: DC | PRN
  Administered 2016-07-31: 6.25 mg via INTRAVENOUS
  Filled 2016-07-31: qty 1

## 2016-07-31 MED ORDER — INSULIN ASPART 100 UNIT/ML ~~LOC~~ SOLN: 0.0000 [IU] | Freq: Three times a day (TID) | SUBCUTANEOUS | Status: DC

## 2016-07-31 MED ORDER — FLUTICASONE PROPIONATE 50 MCG/ACT NA SUSP
2.0000 | Freq: Every day | NASAL | Status: DC | PRN
  Filled 2016-07-31: qty 16

## 2016-07-31 MED ORDER — METFORMIN HCL ER 500 MG PO TB24
1000.0000 mg | ORAL_TABLET | Freq: Every day | ORAL | Status: DC
  Administered 2016-08-01: 1000 mg via ORAL
  Filled 2016-07-31: qty 2

## 2016-07-31 MED ORDER — LACTATED RINGERS IV SOLN
INTRAVENOUS | Status: DC | PRN
  Administered 2016-07-31 (×2): via INTRAVENOUS

## 2016-07-31 MED ORDER — LIDOCAINE HCL (CARDIAC) 20 MG/ML IV SOLN
INTRAVENOUS | Status: DC | PRN
  Administered 2016-07-31: 100 mg via INTRAVENOUS

## 2016-07-31 MED ORDER — FENTANYL CITRATE (PF) 100 MCG/2ML IJ SOLN
INTRAMUSCULAR | Status: DC | PRN
  Administered 2016-07-31: 25 ug via INTRAVENOUS
  Administered 2016-07-31 (×2): 50 ug via INTRAVENOUS
  Administered 2016-07-31: 25 ug via INTRAVENOUS
  Administered 2016-07-31 (×3): 50 ug via INTRAVENOUS

## 2016-07-31 MED ORDER — INSULIN ASPART 100 UNIT/ML ~~LOC~~ SOLN: 0.0000 [IU] | Freq: Every day | SUBCUTANEOUS | Status: DC

## 2016-07-31 MED ORDER — SODIUM CHLORIDE 0.9 % IV SOLN
Freq: Once | INTRAVENOUS | Status: AC
  Administered 2016-07-31: 1000 mL
  Filled 2016-07-31: qty 1

## 2016-07-31 MED ORDER — PROPOFOL 10 MG/ML IV BOLUS
INTRAVENOUS | Status: DC | PRN
  Administered 2016-07-31: 150 mg via INTRAVENOUS

## 2016-07-31 MED ORDER — LISINOPRIL 5 MG PO TABS
5.0000 mg | ORAL_TABLET | Freq: Every day | ORAL | Status: DC
Start: 2016-07-31 — End: 2016-08-01
  Administered 2016-08-01: 5 mg via ORAL
  Filled 2016-07-31 (×2): qty 1

## 2016-07-31 MED ORDER — KETOROLAC TROMETHAMINE 30 MG/ML IJ SOLN
30.0000 mg | Freq: Once | INTRAMUSCULAR | Status: AC
  Administered 2016-07-31: 30 mg via INTRAVENOUS

## 2016-07-31 MED ORDER — ROCURONIUM BROMIDE 50 MG/5ML IV SOSY
PREFILLED_SYRINGE | INTRAVENOUS | Status: AC
  Filled 2016-07-31: qty 5

## 2016-07-31 MED ORDER — PHENYLEPHRINE 40 MCG/ML (10ML) SYRINGE FOR IV PUSH (FOR BLOOD PRESSURE SUPPORT)
PREFILLED_SYRINGE | INTRAVENOUS | Status: AC
  Filled 2016-07-31: qty 10

## 2016-07-31 MED ORDER — FENTANYL CITRATE (PF) 100 MCG/2ML IJ SOLN
INTRAMUSCULAR | Status: AC
  Filled 2016-07-31: qty 2

## 2016-07-31 MED ORDER — PROMETHAZINE HCL 25 MG/ML IJ SOLN: 6.2500 mg | INTRAMUSCULAR | Status: DC | PRN

## 2016-07-31 MED ORDER — HYDROMORPHONE HCL 2 MG PO TABS
2.0000 mg | ORAL_TABLET | ORAL | Status: DC | PRN
  Administered 2016-07-31 – 2016-08-01 (×3): 2 mg via ORAL
  Filled 2016-07-31 (×3): qty 1

## 2016-07-31 MED ORDER — HEPARIN SODIUM (PORCINE) 5000 UNIT/ML IJ SOLN
5000.0000 [IU] | Freq: Once | INTRAMUSCULAR | Status: AC
  Administered 2016-07-31: 5000 [IU] via SUBCUTANEOUS

## 2016-07-31 MED ORDER — CEFAZOLIN SODIUM-DEXTROSE 2-4 GM/100ML-% IV SOLN
2.0000 g | INTRAVENOUS | Status: AC
  Administered 2016-07-31: 2 g via INTRAVENOUS
  Filled 2016-07-31: qty 100

## 2016-07-31 MED ORDER — EPHEDRINE 5 MG/ML INJ
INTRAVENOUS | Status: AC
  Filled 2016-07-31: qty 10

## 2016-07-31 MED ORDER — 0.9 % SODIUM CHLORIDE (POUR BTL) OPTIME
TOPICAL | Status: DC | PRN
  Administered 2016-07-31 (×2): 1000 mL

## 2016-07-31 MED ORDER — HEPARIN SODIUM (PORCINE) 5000 UNIT/ML IJ SOLN
INTRAMUSCULAR | Status: AC
  Filled 2016-07-31: qty 1

## 2016-07-31 MED ORDER — AMLODIPINE BESYLATE 5 MG PO TABS
5.0000 mg | ORAL_TABLET | Freq: Every day | ORAL | Status: DC
  Administered 2016-08-01: 5 mg via ORAL
  Filled 2016-07-31: qty 1

## 2016-07-31 MED ORDER — KETOROLAC TROMETHAMINE 30 MG/ML IJ SOLN
INTRAMUSCULAR | Status: AC
  Filled 2016-07-31: qty 1

## 2016-07-31 MED ORDER — POLYETHYLENE GLYCOL 3350 17 G PO PACK: 17.0000 g | PACK | Freq: Every day | ORAL | Status: DC | PRN

## 2016-07-31 MED ORDER — ROCURONIUM BROMIDE 100 MG/10ML IV SOLN
INTRAVENOUS | Status: DC | PRN
  Administered 2016-07-31: 50 mg via INTRAVENOUS

## 2016-07-31 MED ORDER — LACTATED RINGERS IV SOLN
INTRAVENOUS | Status: DC
  Administered 2016-08-01: 03:00:00 via INTRAVENOUS

## 2016-07-31 MED ORDER — SIMVASTATIN 20 MG PO TABS
20.0000 mg | ORAL_TABLET | Freq: Every day | ORAL | Status: DC
  Administered 2016-07-31: 20 mg via ORAL
  Filled 2016-07-31: qty 1

## 2016-07-31 MED ORDER — MEPERIDINE HCL 25 MG/ML IJ SOLN: 6.2500 mg | INTRAMUSCULAR | Status: DC | PRN

## 2016-07-31 MED ORDER — EPHEDRINE SULFATE 50 MG/ML IJ SOLN
INTRAMUSCULAR | Status: DC | PRN
  Administered 2016-07-31: 15 mg via INTRAVENOUS
  Administered 2016-07-31: 20 mg via INTRAVENOUS
  Administered 2016-07-31: 15 mg via INTRAVENOUS

## 2016-07-31 MED ORDER — PROPOFOL 10 MG/ML IV BOLUS
INTRAVENOUS | Status: AC
  Filled 2016-07-31: qty 20

## 2016-07-31 MED ORDER — SUGAMMADEX SODIUM 200 MG/2ML IV SOLN
INTRAVENOUS | Status: DC | PRN
  Administered 2016-07-31: 150 mg via INTRAVENOUS

## 2016-07-31 MED ORDER — METOPROLOL TARTRATE 12.5 MG HALF TABLET
12.5000 mg | ORAL_TABLET | Freq: Two times a day (BID) | ORAL | Status: DC
  Administered 2016-07-31 – 2016-08-01 (×2): 12.5 mg via ORAL
  Filled 2016-07-31 (×2): qty 1

## 2016-07-31 MED ORDER — HYDROMORPHONE HCL 1 MG/ML IJ SOLN
INTRAMUSCULAR | Status: AC
  Filled 2016-07-31: qty 1

## 2016-07-31 MED ORDER — PROTAMINE SULFATE 10 MG/ML IV SOLN
INTRAVENOUS | Status: AC
  Filled 2016-07-31: qty 5

## 2016-07-31 SURGICAL SUPPLY — 61 items
ATCH SMKEVC FLXB CAUT HNDSWH (FILTER) ×2 IMPLANT
BINDER BREAST LRG (GAUZE/BANDAGES/DRESSINGS) ×6 IMPLANT
BIOPATCH RED 1 DISK 7.0 (GAUZE/BANDAGES/DRESSINGS) ×3 IMPLANT
BLADE 10 SAFETY STRL DISP (BLADE) IMPLANT
CONT SPEC 4OZ CLIKSEAL STRL BL (MISCELLANEOUS) ×3 IMPLANT
COTTONBALL LRG STERILE PKG (GAUZE/BANDAGES/DRESSINGS) IMPLANT
COVER SURGICAL LIGHT HANDLE (MISCELLANEOUS) ×3 IMPLANT
DERMABOND ADVANCED (GAUZE/BANDAGES/DRESSINGS) ×2
DERMABOND ADVANCED .7 DNX12 (GAUZE/BANDAGES/DRESSINGS) ×4 IMPLANT
DRAPE LAPAROSCOPIC ABDOMINAL (DRAPES) IMPLANT
DRAPE ORTHO SPLIT 77X108 STRL (DRAPES) ×2
DRAPE SURG ORHT 6 SPLT 77X108 (DRAPES) ×4 IMPLANT
DRAPE WARM FLUID 44X44 (DRAPE) ×3 IMPLANT
DRSG PAD ABDOMINAL 8X10 ST (GAUZE/BANDAGES/DRESSINGS) ×6 IMPLANT
DRSG SORBAVIEW 3.5X5-5/16 MED (GAUZE/BANDAGES/DRESSINGS) ×3 IMPLANT
ELECT CAUTERY BLADE 6.4 (BLADE) ×3 IMPLANT
ELECT REM PT RETURN 9FT ADLT (ELECTROSURGICAL) ×3
ELECTRODE REM PT RTRN 9FT ADLT (ELECTROSURGICAL) ×2 IMPLANT
EVACUATOR SILICONE 100CC (DRAIN) ×3 IMPLANT
EVACUATOR SMOKE ACCUVAC VALLEY (FILTER) ×1
GAUZE SPONGE 4X4 12PLY STRL (GAUZE/BANDAGES/DRESSINGS) IMPLANT
GAUZE XEROFORM 5X9 LF (GAUZE/BANDAGES/DRESSINGS) IMPLANT
GLOVE BIO SURGEON STRL SZ 6.5 (GLOVE) ×6 IMPLANT
GLOVE BIO SURGEON STRL SZ7 (GLOVE) ×6 IMPLANT
GLOVE BIO SURGEON STRL SZ7.5 (GLOVE) ×3 IMPLANT
GLOVE BIO SURGEON STRL SZ8 (GLOVE) IMPLANT
GLOVE BIOGEL PI IND STRL 6.5 (GLOVE) ×2 IMPLANT
GLOVE BIOGEL PI IND STRL 8 (GLOVE) ×2 IMPLANT
GLOVE BIOGEL PI INDICATOR 6.5 (GLOVE) ×1
GLOVE BIOGEL PI INDICATOR 8 (GLOVE) ×1
GOWN STRL REUS W/ TWL LRG LVL3 (GOWN DISPOSABLE) ×6 IMPLANT
GOWN STRL REUS W/ TWL XL LVL3 (GOWN DISPOSABLE) ×2 IMPLANT
GOWN STRL REUS W/TWL LRG LVL3 (GOWN DISPOSABLE) ×3
GOWN STRL REUS W/TWL XL LVL3 (GOWN DISPOSABLE) ×1
IMPL GEL HI PROFILE 350CC (Breast) ×2 IMPLANT
IMPLANT GEL HI PROFILE 350CC (Breast) ×3 IMPLANT
KIT BASIN OR (CUSTOM PROCEDURE TRAY) ×3 IMPLANT
KIT ROOM TURNOVER OR (KITS) ×3 IMPLANT
MARKER SKIN DUAL TIP RULER LAB (MISCELLANEOUS) ×3 IMPLANT
NS IRRIG 1000ML POUR BTL (IV SOLUTION) ×3 IMPLANT
PACK GENERAL/GYN (CUSTOM PROCEDURE TRAY) ×3 IMPLANT
PAD ARMBOARD 7.5X6 YLW CONV (MISCELLANEOUS) ×6 IMPLANT
PREFILTER EVAC NS 1 1/3-3/8IN (MISCELLANEOUS) IMPLANT
SPECIMEN JAR LARGE (MISCELLANEOUS) IMPLANT
SPONGE GAUZE 4X4 12PLY STER LF (GAUZE/BANDAGES/DRESSINGS) ×6 IMPLANT
SPONGE LAP 18X18 X RAY DECT (DISPOSABLE) ×3 IMPLANT
STRIP CLOSURE SKIN 1/2X4 (GAUZE/BANDAGES/DRESSINGS) IMPLANT
SUT MNCRL AB 3-0 PS2 18 (SUTURE) ×15 IMPLANT
SUT MNCRL AB 4-0 PS2 18 (SUTURE) ×3 IMPLANT
SUT MON AB 2-0 CT1 36 (SUTURE) ×3 IMPLANT
SUT PDS AB 2-0 CT1 27 (SUTURE) ×3 IMPLANT
SUT PROLENE 3 0 PS 2 (SUTURE) ×3 IMPLANT
SUT PROLENE 5 0 P 3 (SUTURE) IMPLANT
SUT VIC AB 2-0 FS1 27 (SUTURE) IMPLANT
SUT VIC AB 3-0 SH 8-18 (SUTURE) ×3 IMPLANT
TOWEL GREEN STERILE (TOWEL DISPOSABLE) ×3 IMPLANT
TOWEL OR 17X24 6PK STRL BLUE (TOWEL DISPOSABLE) IMPLANT
TOWEL OR 17X26 10 PK STRL BLUE (TOWEL DISPOSABLE) IMPLANT
TUBE CONNECTING 12X1/4 (SUCTIONS) ×3 IMPLANT
TUBING 1/4  WITH 7/8  ADAPTER (MISCELLANEOUS) IMPLANT
WATER STERILE IRR 1000ML POUR (IV SOLUTION) ×3 IMPLANT

## 2016-07-31 NOTE — Anesthesia Postprocedure Evaluation (Signed)
Anesthesia Post Note  Patient: Brittany Greene  Procedure(s) Performed: Procedure(s) (LRB): LEFT BREAST TOTAL CAPSULECTOMY REMOVE IMPLANT WITH DELAYED RECONSTRUCTION WITH PLACMENT WITH SILICONE GEL IMPLANTS (Left) RIGHT MASTOPEXY (Right)  Patient location during evaluation: PACU Anesthesia Type: General Level of consciousness: awake and alert Pain management: pain level controlled Vital Signs Assessment: post-procedure vital signs reviewed and stable Respiratory status: spontaneous breathing, nonlabored ventilation and respiratory function stable Cardiovascular status: blood pressure returned to baseline and stable Postop Assessment: no signs of nausea or vomiting Anesthetic complications: no       Last Vitals:  Vitals:   07/31/16 1125 07/31/16 1155  BP: 129/79 123/72  Pulse: 63 62  Resp: 17 18  Temp: 36.6 C 36.4 C    Last Pain:  Vitals:   07/31/16 1155  TempSrc: Oral  PainSc:                  Brittany Greene,W. EDMOND

## 2016-07-31 NOTE — H&P (Signed)
I have re-evaluated and re-examined the patient and there are no changes.  See office notes in paper chart for H&P. 

## 2016-07-31 NOTE — Transfer of Care (Signed)
Immediate Anesthesia Transfer of Care Note  Patient: Brittany Greene  Procedure(s) Performed: Procedure(s): LEFT BREAST TOTAL CAPSULECTOMY REMOVE IMPLANT WITH DELAYED RECONSTRUCTION WITH PLACMENT WITH SILICONE GEL IMPLANTS (Left) RIGHT MASTOPEXY (Right)  Patient Location: PACU  Anesthesia Type:General  Level of Consciousness: awake, alert , oriented and patient cooperative  Airway & Oxygen Therapy: Patient Spontanous Breathing and Patient connected to face mask oxygen  Post-op Assessment: Report given to RN and Post -op Vital signs reviewed and stable  Post vital signs: Reviewed and stable  Last Vitals:  Vitals:   07/31/16 0622  BP: 118/71  Pulse: (!) 58  Resp: 20  Temp: 37.1 C    Last Pain:  Vitals:   07/31/16 0632  TempSrc:   PainSc: 0-No pain      Patients Stated Pain Goal: 5 (99/87/21 5872)  Complications: No apparent anesthesia complications

## 2016-07-31 NOTE — Brief Op Note (Signed)
07/31/2016  10:02 AM  PATIENT:  [name]  76 y.o. female  PRE-OPERATIVE DIAGNOSIS:  BREAST CANCER  POST-OPERATIVE DIAGNOSIS:  BREAST CANCER  PROCEDURE:  Procedure(s): LEFT BREAST TOTAL CAPSULECTOMY REMOVE IMPLANT WITH DELAYED RECONSTRUCTION WITH PLACMENT WITH SILICONE GEL IMPLANTS (Left) RIGHT MASTOPEXY (Right)  SURGEON:  Surgeon(s) and Role:    * Crissie Reese, MD - Primary  PHYSICIAN ASSISTANT:   ASSISTANTS: Judyann Munson, RNFA   ANESTHESIA:   general  EBL:  Total I/O In: 1500 [I.V.:1500] Out: 70 [Blood:50]  BLOOD ADMINISTERED:none  DRAINS: One Blake drain left chest   LOCAL MEDICATIONS USED:  NONE  SPECIMEN:  Left breast capsule  DISPOSITION OF SPECIMEN:  Pathology  COUNTS:  YES  TOURNIQUET:  * No tourniquets in log *  DICTATION: #[id number]  PLAN OF CARE: Admit for overnight observation  PATIENT DISPOSITION:  PACU - hemodynamically stable.   Delay start of Pharmacological VTE agent (>24hrs) due to surgical blood loss or risk of bleeding: no

## 2016-07-31 NOTE — Progress Notes (Signed)
1155 Received pt from PACU, A&O x4. Dressing to bil breasts with binder dry and intact. Denies pain at this time.  Walked to the bathroom with one person assist. Assisted back to bed.

## 2016-07-31 NOTE — Anesthesia Procedure Notes (Signed)
Procedure Name: Intubation Date/Time: 07/31/2016 7:46 AM Performed by: Lance Coon Pre-anesthesia Checklist: Patient identified, Emergency Drugs available, Suction available, Timeout performed and Patient being monitored Patient Re-evaluated:Patient Re-evaluated prior to inductionOxygen Delivery Method: Circle system utilized Preoxygenation: Pre-oxygenation with 100% oxygen Intubation Type: IV induction Ventilation: Mask ventilation without difficulty Laryngoscope Size: Miller and 3 Grade View: Grade I Tube type: Oral Tube size: 7.0 mm Number of attempts: 1 Airway Equipment and Method: Stylet Placement Confirmation: ETT inserted through vocal cords under direct vision,  positive ETCO2 and breath sounds checked- equal and bilateral Secured at: 21 cm Tube secured with: Tape Dental Injury: Teeth and Oropharynx as per pre-operative assessment

## 2016-08-01 ENCOUNTER — Encounter (HOSPITAL_COMMUNITY): Payer: Self-pay | Admitting: Plastic Surgery

## 2016-08-01 DIAGNOSIS — N650 Deformity of reconstructed breast: Secondary | ICD-10-CM | POA: Diagnosis not present

## 2016-08-01 LAB — HEMOGLOBIN A1C
HEMOGLOBIN A1C: 6.2 % — AB (ref 4.8–5.6)
Mean Plasma Glucose: 131 mg/dL

## 2016-08-01 LAB — GLUCOSE, CAPILLARY: GLUCOSE-CAPILLARY: 111 mg/dL — AB (ref 65–99)

## 2016-08-01 MED ORDER — HYDROMORPHONE HCL 2 MG PO TABS: 2.0000 mg | ORAL_TABLET | ORAL | 0 refills | Status: DC | PRN

## 2016-08-01 MED ORDER — METHOCARBAMOL 500 MG PO TABS: 500.0000 mg | ORAL_TABLET | Freq: Three times a day (TID) | ORAL | 0 refills | Status: DC | PRN

## 2016-08-01 MED ORDER — DOXYCYCLINE HYCLATE 100 MG PO TABS
100.0000 mg | ORAL_TABLET | Freq: Two times a day (BID) | ORAL | Status: DC
  Administered 2016-08-01: 100 mg via ORAL
  Filled 2016-08-01: qty 1

## 2016-08-01 MED ORDER — DOXYCYCLINE HYCLATE 100 MG PO TABS: 100.0000 mg | ORAL_TABLET | Freq: Two times a day (BID) | ORAL | 0 refills | Status: DC

## 2016-08-01 MED ORDER — DOCUSATE SODIUM 100 MG PO CAPS: 100.0000 mg | ORAL_CAPSULE | Freq: Every day | ORAL | 0 refills | Status: DC

## 2016-08-01 NOTE — Op Note (Signed)
NAMEABRIL, CAPPIELLO                ACCOUNT NO.:  [id number]  MEDICAL RECORD NO.:  [id number]  LOCATION:                                 FACILITY:  PHYSICIAN:  Crissie Reese, M.D.          DATE OF BIRTH:  DATE OF PROCEDURE:  07/31/2016 DATE OF DISCHARGE:                              OPERATIVE REPORT   PREOPERATIVE DIAGNOSES: 1. Personal history of breast cancer. 2. History of left breast reconstruction. 3. Severe left breast capsular contracture.  POSTOPERATIVE DIAGNOSES: 1. Personal history of breast cancer. 2. History of left breast reconstruction. 3. Severe left breast capsular contracture. 4. She had a significant gel bleed from her left breast implant.  PROCEDURES PERFORMED: 1. Left total capsulectomy. 2. Removal of left breast implant with significant gel bleed. 3. Delayed breast reconstruction with silicone gel implant. 4. Right mastopexy.  SURGEON:  Crissie Reese, M.D.  ASSISTANT:  Judyann Munson RNFA.  CLINICAL NOTE:  A 76 year old woman with a history of left breast cancer and reconstruction, left breast.  This was performed in [place]. She had multiple complications including a failed DIEP flap.  She eventually had reconstruction with an implant.  She has developed a significant capsular contracture over the years and significant asymmetry with the opposite side.  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  She would like to have all of this improved and improved symmetry.  The nature of the procedures and risks and possible complications were discussed with her in detail.  Risks include, but not limited to, bleeding, infection, healing problems, scarring, loss of sensation, fluid accumulations, anesthesia related complications, failure of device, capsular contracture, displacement device, wrinkles and ripples, asymmetry, chronic pain, pneumothorax, DVT, PE, chronic pain as well as overall disappointment.  She understood all this  and wished to proceed.  DESCRIPTION OF PROCEDURE:  The patient was marked in the holding area in a full upright position.  She was taken to the operating room and placed supine.  After successful general anesthesia, she was prepped with ChloraPrep and after waiting 3 minutes for drying, she was draped with sterile drapes.  The left side was approached first using the lateral aspect of the old mastectomy scar and dissection carried down through the subcutaneous tissue.  The underlying muscle was identified and the muscle was then divided and the underlying implant was identified.  It had a fairly significant gel bleed and was removed.  A total capsulectomy was then performed taking great care to avoid entry into the left chest.  A total capsulectomy having been completed.  This was removed from the table as a specimen.  Thorough irrigation with saline. Hemostasis with electrocautery.  A couple of 2-0 PDS interrupted inverted sutures were placed laterally to close off the lateral space. A 19-French drain in position, brought through the separate stab wound inferolaterally and secured with a 3-0 Prolene suture.  Thorough irrigation with saline and excellent hemostasis was confirmed. Antibiotic solution was placed not to dwell on the space.  The implant that was removed was a 350 mL implant, although the operative note from the surgeons in [place] had said 250 mL.  Fortunately, 350  mL implants were available to [place] here at the hospital.  One of these was opened and soaked in antibiotic solution.  After allowing dwell time for antibiotic solution in the space and after cleaning gloves and prepping the skin with Betadine, the implant was then positioned.  This was a Mentor 350 mL Smooth Round High-profile MemoryGel Silicone implant. Care was taken to make sure that the implant was oriented properly. Again antibiotic solution was placed in the space and the deep closure with 3-0 Vicryl  interrupted sutures with care taken to avoid damage to underlying implant, which was kept under direct vision at all times. Again, irrigation with saline and the remainder of the closure with 3-0 Monocryl inverted deep dermal sutures and running 3-0 Monocryl subcuticular suture.  Attention was directed to the right breast.  Measurement taken 7 cm inferior to the nipple and then this large ellipse was marked from the present inframammary crease to this measurement.  This intervening skin was then excised and again meticulous hemostasis was achieved using electrocautery.  Thorough irrigation with saline and then the upper skin edge was then advanced down to the inframammary fold and the closure with 2-0 Monocryl and 3-0 Monocryl interrupted inverted deep dermal sutures and a running 3-0 Monocryl subcuticular suture.  Dermabond, dry sterile dressings, and the breast vest were positioned.  She was transferred to the recovery room in stable having tolerated procedure well.  DISPOSITION:  She will be observed overnight.     Crissie Reese, M.D.   ______________________________ Crissie Reese, M.D.    DB/MEDQ  D:  07/31/2016  T:  08/01/2016  Job:  [id number]  cc:   Crissie Reese, M.D.

## 2016-08-01 NOTE — Progress Notes (Signed)
Pt discharged home in stable condition after going over discharge instructions with no concerns voiced. Went home with JP drain after dressing to drain was changed. Pt familiar with JP drain care. Script for dilaudid given before discharge. All other discharge meds called in to the [facility] on [address] by the MD  as pharmacy listed on [facility] is an out of state one and she only uses that pharmacy for mail order prescriptions

## 2016-08-01 NOTE — Discharge Summary (Signed)
Physician Discharge Summary  Patient ID: Brittany Greene MRN: [id number] DOB/AGE: [birth year] 76 y.o.  Admit date: 07/31/2016 Discharge date: 08/01/2016  Admission Diagnoses: Personal history of breast cancer  Discharge Diagnoses:  Active Problems:   Personal history of breast cancer   Discharged Condition: good  Hospital Course: On the day of admission the patient was taken to surgery and had Left breast capsulectomy and removal of implant with replacement of silicone gel implant for delayed breast reconstruction and right mastopexy. The patient tolerated the procedures well. Postoperatively, the operative sites look good and diabetes is under good control. The patient was ambulatory and tolerating diet on the first postoperative day. She is ready for discharge.  Treatments: antibiotics: Ancef, anticoagulation: heparin and surgery: As above.  Discharge Exam: Blood pressure 123/73, pulse 65, temperature 98.3 F (36.8 C), temperature source Oral, resp. rate 18, height 5\' 4"  (1.626 m), weight 155 lb 6.8 oz (70.5 kg), SpO2 100 %.  Operative sites: No evidence of bleeding or infection either breast. Drain functioning. Drainage thin.  Disposition: 02-Transferred to [facility]   Allergies as of 08/01/2016      Reactions   No Known Allergies       Medication List    STOP taking these medications   aspirin 81 MG tablet     TAKE these medications   amLODipine 5 MG tablet Commonly known as:  NORVASC Take 5 mg by mouth daily.   docusate sodium 100 MG capsule Commonly known as:  COLACE Take 1 capsule (100 mg total) by mouth daily.   doxycycline 100 MG tablet Commonly known as:  VIBRA-TABS Take 1 tablet (100 mg total) by mouth every 12 (twelve) hours.   fluticasone 50 MCG/ACT nasal spray Commonly known as:  FLONASE Place 2 sprays into both nostrils daily as needed for allergies.   HYDROmorphone 2 MG tablet Commonly known as:  DILAUDID Take 1-2 tablets (2-4 mg total) by  mouth every 4 (four) hours as needed for moderate pain or severe pain.   lisinopril 5 MG tablet Commonly known as:  PRINIVIL,ZESTRIL Take 5 mg by mouth daily.   metFORMIN 500 MG 24 hr tablet Commonly known as:  GLUCOPHAGE-XR Take 1,000 mg by mouth daily with breakfast.   methocarbamol 500 MG tablet Commonly known as:  ROBAXIN Take 1 tablet (500 mg total) by mouth every 8 (eight) hours as needed for muscle spasms.   metoprolol tartrate 25 MG tablet Commonly known as:  LOPRESSOR Take 12.5 mg by mouth 2 (two) times daily.   polyethylene glycol packet Commonly known as:  MIRALAX / GLYCOLAX Take 17 g by mouth daily as needed for moderate constipation.   simvastatin 20 MG tablet Commonly known as:  ZOCOR Take 20 mg by mouth daily.        SignedHarlow Mares, Amarien Carne M 08/01/2016, 8:40 AM

## 2016-08-01 NOTE — Discharge Instructions (Signed)
° ° ° ° °  SignedHarlow Mares, Yanira Tolsma M 08/01/2016, 8:36 AM No lifting for 6 weeks No vigorous activity for 6 weeks (including outdoor walks) No driving for 4 weeks OK to walk up stairs slowly Stay propped up Use incentive spirometer at home every hour while awake No shower while drains are in place Empty drains at least three times a day and record the amounts separately Change drain dressings every third day if instructed to do so by Dr. Harlow Mares  Apply Bacitracin antibiotic ointment to the drain sites  Place gauze dressing over drains  Secure the gauze with tape Take an over-the-counter Probiotic while on antibiotics Take an over-the-counter stool softener (such as Colace) while on pain medication See Dr. Harlow Mares next week in office For questions call [phone] or [phone]

## 2016-08-04 LAB — GLUCOSE, CAPILLARY: Glucose-Capillary: 134 mg/dL — ABNORMAL HIGH (ref 65–99)

## 2017-01-28 ENCOUNTER — Other Ambulatory Visit: Payer: Self-pay | Admitting: *Deleted

## 2017-01-28 DIAGNOSIS — Z1231 Encounter for screening mammogram for malignant neoplasm of breast: Secondary | ICD-10-CM

## 2017-03-05 ENCOUNTER — Ambulatory Visit
Admission: RE | Admit: 2017-03-05 | Discharge: 2017-03-05 | Disposition: A | Discharge status: Home / Self Care | Payer: Medicare Other | Source: Ambulatory Visit | Attending: *Deleted | Admitting: *Deleted

## 2017-03-05 DIAGNOSIS — Z1231 Encounter for screening mammogram for malignant neoplasm of breast: Secondary | ICD-10-CM

## 2017-03-09 ENCOUNTER — Other Ambulatory Visit: Payer: Self-pay | Admitting: *Deleted

## 2017-03-09 DIAGNOSIS — R928 Other abnormal and inconclusive findings on diagnostic imaging of breast: Secondary | ICD-10-CM

## 2017-03-11 ENCOUNTER — Other Ambulatory Visit: Payer: Self-pay | Admitting: *Deleted

## 2017-03-11 ENCOUNTER — Ambulatory Visit: Admission: RE | Admit: 2017-03-11 | Payer: Medicare Other | Source: Ambulatory Visit

## 2017-03-11 ENCOUNTER — Ambulatory Visit
Admission: RE | Admit: 2017-03-11 | Discharge: 2017-03-11 | Disposition: A | Discharge status: Home / Self Care | Payer: Medicare Other | Source: Ambulatory Visit | Attending: *Deleted | Admitting: *Deleted

## 2017-03-11 DIAGNOSIS — N6489 Other specified disorders of breast: Secondary | ICD-10-CM

## 2017-03-11 DIAGNOSIS — R928 Other abnormal and inconclusive findings on diagnostic imaging of breast: Secondary | ICD-10-CM

## 2017-03-11 HISTORY — DX: Personal history of antineoplastic chemotherapy: Z92.21

## 2017-09-15 ENCOUNTER — Ambulatory Visit
Admission: RE | Admit: 2017-09-15 | Discharge: 2017-09-15 | Disposition: A | Discharge status: Home / Self Care | Payer: Medicare Other | Source: Ambulatory Visit | Attending: *Deleted | Admitting: *Deleted

## 2017-09-15 DIAGNOSIS — N6489 Other specified disorders of breast: Secondary | ICD-10-CM

## 2018-08-06 ENCOUNTER — Other Ambulatory Visit: Payer: Self-pay | Admitting: *Deleted

## 2018-08-06 DIAGNOSIS — Z1231 Encounter for screening mammogram for malignant neoplasm of breast: Secondary | ICD-10-CM

## 2018-09-27 ENCOUNTER — Ambulatory Visit: Payer: Medicare Other

## 2018-12-08 ENCOUNTER — Ambulatory Visit
Admission: RE | Admit: 2018-12-08 | Discharge: 2018-12-08 | Disposition: A | Discharge status: Home / Self Care | Payer: Medicare Other | Source: Ambulatory Visit | Attending: *Deleted | Admitting: *Deleted

## 2018-12-08 ENCOUNTER — Other Ambulatory Visit: Payer: Self-pay

## 2018-12-08 DIAGNOSIS — Z1231 Encounter for screening mammogram for malignant neoplasm of breast: Secondary | ICD-10-CM

## 2018-12-23 ENCOUNTER — Encounter: Payer: Self-pay | Admitting: Gastroenterology

## 2018-12-28 ENCOUNTER — Ambulatory Visit: Payer: Medicare Other | Admitting: Gastroenterology

## 2019-01-18 ENCOUNTER — Ambulatory Visit (INDEPENDENT_AMBULATORY_CARE_PROVIDER_SITE_OTHER): Payer: Medicare Other | Admitting: Gastroenterology

## 2019-01-18 ENCOUNTER — Other Ambulatory Visit: Payer: Self-pay

## 2019-01-18 ENCOUNTER — Encounter: Payer: Self-pay | Admitting: Gastroenterology

## 2019-01-18 VITALS — BP 110/60 | HR 81 | Temp 98.2°F | Ht 64.0 in | Wt 149.1 lb

## 2019-01-18 DIAGNOSIS — K5900 Constipation, unspecified: Secondary | ICD-10-CM | POA: Insufficient documentation

## 2019-01-18 NOTE — Patient Instructions (Signed)
Take over the counter Miralax mixing 17 grams in 8 oz of water daily and can increase to twice daily if needed.   We will fax a release to obtain your previous GI records.

## 2019-01-18 NOTE — Progress Notes (Signed)
01/18/2019 Brittany Wales RN:[id number] Mar 31, 1941   HISTORY OF PRESENT ILLNESS: This is a pleasant 78 year old female who is new to our office.  She has GI history with Dr. Collene Mares where she has had colonoscopies in the past.  We will try to obtain those records.  Anyway, she presents to our office today at the request of her PCP, Everardo Beals, NP, for evaluation of constipation.  She tells me that she has had constipation for most of her life.  Used to have a bowel movement every 3 to 4 days.  Recently she has become much more constipated, passing small pellet-like stools.  She says normally in the past when she get constipated she would take a Dulcolax, which would help.  Recently she took 3 Dulcolax during one episode and that did not cause her to move her stools at all.  She then used a fleets enema, which resulted in elimination of stool.  She says that over the past several months since COVID her eating habits and activity have been much different.  Recent TSH normal.  She admits that she does not drink enough liquid, particularly water.  She does have Dilaudid listed on her medication list for moderate to severe pain, but I did not particularly ask her how often she uses that medication.  She denies abdominal pain or rectal bleeding, but does report overall bloating and fullness sensation when she is constipated.   Past Medical History:  Diagnosis Date   AA (aortic aneurysm) ([place])    Anemia    Cancer of left breast ([place])    High cholesterol    History of blood transfusion    "related to surgery"   Hypertension    Personal history of chemotherapy 2004   Pneumonia    Type II diabetes mellitus ([place])    dx 2006   Past Surgical History:  Procedure Laterality Date   BACK SURGERY     BREAST BIOPSY Left 2003   BREAST CAPSULECTOMY WITH IMPLANT EXCHANGE Left 07/31/2016   Procedure: LEFT BREAST TOTAL CAPSULECTOMY REMOVE IMPLANT WITH DELAYED RECONSTRUCTION WITH PLACMENT WITH  SILICONE GEL IMPLANTS;  Surgeon: Crissie Reese, MD;  Location: [facility];  Service: Plastics;  Laterality: Left;   BREAST RECONSTRUCTION Left 07/31/2016   BREAST TOTAL CAPSULECTOMY REMOVE IMPLANT WITH DELAYED RECONSTRUCTION WITH PLACMENT WITH SILICONE GEL IMPLANTS    LUMBAR MICRODISCECTOMY  ~ 1990   MASTECTOMY Left 2003   MASTOPEXY Right 07/31/2016   MASTOPEXY Right 07/31/2016   Procedure: RIGHT MASTOPEXY;  Surgeon: Crissie Reese, MD;  Location: [facility];  Service: Plastics;  Laterality: Right;   NASAL SINUS SURGERY  1990s   THORACIC AORTIC ANEURYSM REPAIR  03/06/2015   TONSILLECTOMY     TUBAL LIGATION      reports that she quit smoking about 16 years ago. Her smoking use included cigarettes. She has a 22.50 pack-year smoking history. She has never used smokeless tobacco. She reports current alcohol use. She reports that she does not use drugs. family history is not on file. Allergies  Allergen Reactions   No Known Allergies       Outpatient Encounter Medications as of 01/18/2019  Medication Sig   amLODipine (NORVASC) 10 MG tablet Take 10 mg by mouth daily.    docusate sodium (COLACE) 100 MG capsule Take 1 capsule (100 mg total) by mouth daily.   doxycycline (VIBRA-TABS) 100 MG tablet Take 1 tablet (100 mg total) by mouth every 12 (twelve) hours.   fluticasone (FLONASE) 50  MCG/ACT nasal spray Place 2 sprays into both nostrils daily as needed for allergies.    GLIPIZIDE XL 10 MG 24 hr tablet 10 mg.   HYDROmorphone (DILAUDID) 2 MG tablet Take 1-2 tablets (2-4 mg total) by mouth every 4 (four) hours as needed for moderate pain or severe pain.   lisinopril (ZESTRIL) 10 MG tablet Take 10 mg by mouth daily.    methocarbamol (ROBAXIN) 500 MG tablet Take 1 tablet (500 mg total) by mouth every 8 (eight) hours as needed for muscle spasms.   metoprolol tartrate (LOPRESSOR) 25 MG tablet Take 12.5 mg by mouth 2 (two) times daily.   polyethylene glycol (MIRALAX / GLYCOLAX) packet Take 17 g  by mouth daily as needed for moderate constipation.   simvastatin (ZOCOR) 20 MG tablet Take 20 mg by mouth daily.   [DISCONTINUED] metFORMIN (GLUCOPHAGE-XR) 500 MG 24 hr tablet Take 1,000 mg by mouth daily with breakfast.   No facility-administered encounter medications on file as of 01/18/2019.      REVIEW OF SYSTEMS  : All other systems reviewed and negative except where noted in the History of Present Illness.   PHYSICAL EXAM: BP 110/60 (BP Location: Right Arm, Patient Position: Sitting, Cuff Size: Normal)    Pulse 81    Temp 98.2 F (36.8 C)    Ht 5\' 4"  (1.626 m)    Wt 149 lb 2 oz (67.6 kg)    BMI 25.60 kg/m  General: Well developed black female in no acute distress Head: Normocephalic and atraumatic Eyes:  Sclerae anicteric, conjunctiva pink. Ears: Normal auditory acuity Lungs: Clear throughout to auscultation; no increased WOB. Heart: Regular rate and rhythm; no M/R/G. Abdomen: Soft, non-distended.  BS present.  Non-tender. Musculoskeletal: Symmetrical with no gross deformities  Skin: No lesions on visible extremities Extremities: No edema  Neurological: Alert oriented x 4, grossly non-focal Psychological:  Alert and cooperative. Normal mood and affect  ASSESSMENT AND PLAN: *Chronic constipation: Has experienced this her whole life.  Previously was having good bowel movement every 3 to 4 days, but has worsened recently.  TSH normal.  Will obtain previous colonoscopy records from Dr. Lorie Apley office for review.  I have recommended for her to begin by taking MiraLAX daily and increase to twice daily if needed.  This change could very well be secondary to change in eating habits and activity over the past several months.  Also may in part be due to medications as well.  Needs to increase water/fluid intake as well.   CC:  Everardo Beals, NP

## 2019-01-20 NOTE — Progress Notes (Signed)
Attending Physician's Attestation   I have reviewed the chart.   I agree with the Advanced Practitioner's note, impression, and recommendations with any updates as below.  We will see how she does with medication adjustments as per PA Zehr.  We will see colonoscopy results and consider role of diagnostic colonoscopy if needed.  May trial Amitiza/Trulance/Linzess in the future.  Justice Britain, MD [facility] Advanced Endoscopy Office # [phone]

## 2019-03-17 ENCOUNTER — Telehealth: Payer: Self-pay

## 2019-03-17 NOTE — Telephone Encounter (Signed)
-----   Message from Loralie Champagne, PA-C sent at 03/17/2019  1:39 PM EDT ----- I saw this patient back in August.  Please touch base with her and see how her constipation has been with the MiraLAX, once or twice daily.  I received her colonoscopy records from Dr. Benson Norway and it appears that her last colonoscopy was in September 2012 at which time she was found to have a couple of hyperplastic polyps that were removed, diverticulosis, and hemorrhoids.  Please save this to her chart and place her in for a September 2022 colonoscopy recall for now [facility]).  Also, offer her a follow-up office visit if her constipation is still continuing to be an issue.  Thank you,  Jess

## 2019-03-17 NOTE — Telephone Encounter (Signed)
I spoke with the pt and she tells me that she is doing very well on miralax twice daily.  Recall in Epic for September 2022.  She has also been advised to call if needed prior to Sept. 2022

## 2019-10-28 ENCOUNTER — Other Ambulatory Visit: Payer: Self-pay | Admitting: *Deleted

## 2019-10-28 DIAGNOSIS — Z1231 Encounter for screening mammogram for malignant neoplasm of breast: Secondary | ICD-10-CM

## 2019-12-12 ENCOUNTER — Other Ambulatory Visit: Payer: Self-pay | Admitting: *Deleted

## 2019-12-12 DIAGNOSIS — Z1231 Encounter for screening mammogram for malignant neoplasm of breast: Secondary | ICD-10-CM

## 2019-12-22 ENCOUNTER — Other Ambulatory Visit: Payer: Self-pay

## 2019-12-22 ENCOUNTER — Ambulatory Visit
Admission: RE | Admit: 2019-12-22 | Discharge: 2019-12-22 | Disposition: A | Discharge status: Home / Self Care | Payer: Medicare Other | Source: Ambulatory Visit | Attending: *Deleted | Admitting: *Deleted

## 2019-12-22 DIAGNOSIS — Z1231 Encounter for screening mammogram for malignant neoplasm of breast: Secondary | ICD-10-CM

## 2020-11-19 ENCOUNTER — Other Ambulatory Visit: Payer: Self-pay | Admitting: *Deleted

## 2020-11-19 DIAGNOSIS — Z1231 Encounter for screening mammogram for malignant neoplasm of breast: Secondary | ICD-10-CM

## 2021-01-10 ENCOUNTER — Ambulatory Visit
Admission: RE | Admit: 2021-01-10 | Discharge: 2021-01-10 | Disposition: A | Discharge status: Home / Self Care | Payer: Medicare Other | Source: Ambulatory Visit | Attending: *Deleted | Admitting: *Deleted

## 2021-01-10 ENCOUNTER — Other Ambulatory Visit: Payer: Self-pay

## 2021-01-10 DIAGNOSIS — Z1231 Encounter for screening mammogram for malignant neoplasm of breast: Secondary | ICD-10-CM

## 2021-01-22 LAB — COLOGUARD: COLOGUARD: NEGATIVE

## 2021-02-27 ENCOUNTER — Telehealth: Payer: Self-pay

## 2021-02-27 NOTE — Telephone Encounter (Signed)
I see Cologuard is negative on CareEverywhere. Because of the negative Cologuard, I think it is reasonable to hold on surveillance/screening colonoscopy. Certainly if the patient has other issues that are occurring we may need to consider diagnostic colonoscopy down the road. If the patient is otherwise doing well she can follow-up with our office as needed. Please remove recall from the system. Thanks. GM

## 2021-02-27 NOTE — Telephone Encounter (Signed)
Colonoscopy  Recall- September 2022  Called patient this afternoon- PCP ordered Cologuard 01/21/21 with negative results. Pt states that she was told that at her age Cologuard would be recommended, however if results were to ever be positive, then colonoscopy would need to be considered at that time. Results of cologuard have been sent to be scanned in. Pt will follow-up with our office as needed.

## 2021-11-07 IMAGING — MG MM DIGITAL SCREENING UNILAT*R* W/ TOMO W/ CAD
4 series · 4 of 12 positions shown · non-contrast
Comparison: Previous exam(s).

CLINICAL DATA: Screening.

EXAM:
DIGITAL SCREENING UNILATERAL RIGHT MAMMOGRAM WITH CAD AND
TOMOSYNTHESIS
TECHNIQUE: Right screening digital craniocaudal and mediolateral oblique
mammograms were obtained. Right screening digital breast
tomosynthesis was performed. The images were evaluated with
computer-aided detection.

[R MLO synth-2D]
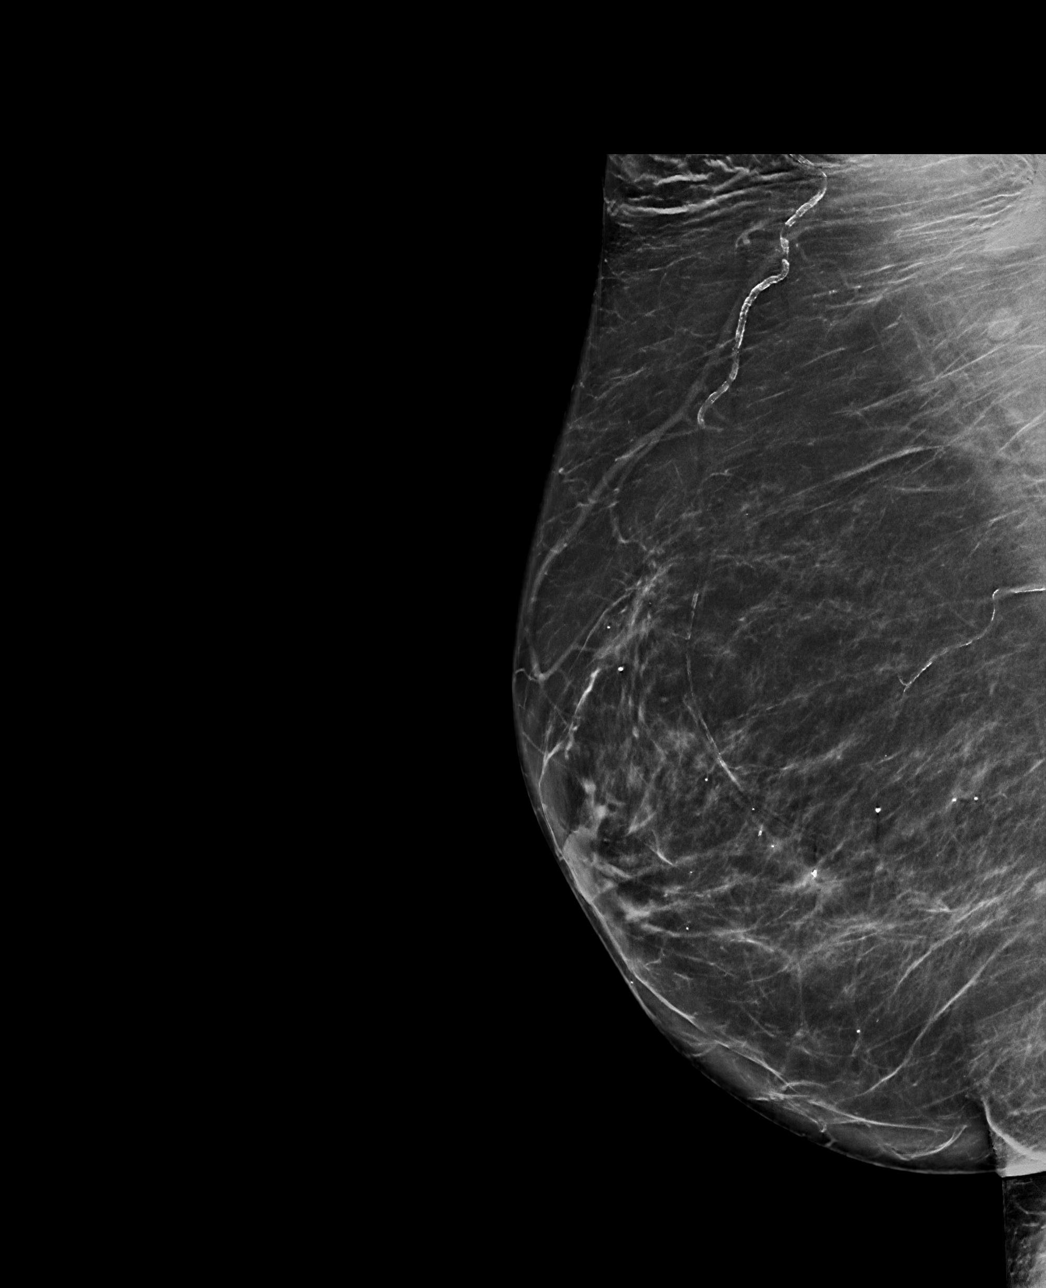

[R CC synth-2D]
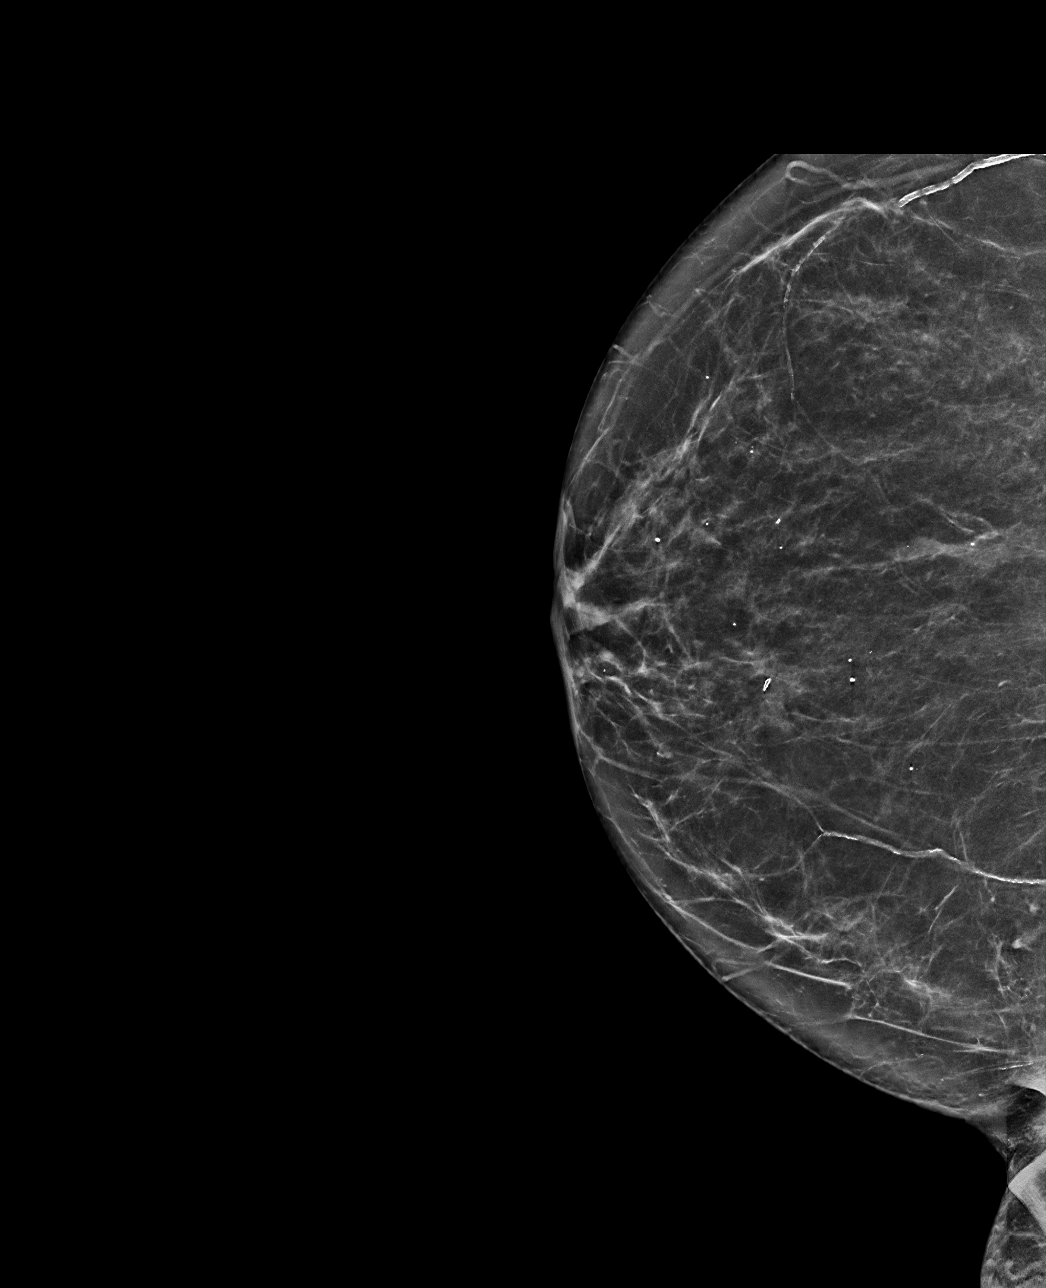

[R MLO tomo · tomo slice 43/85.0]
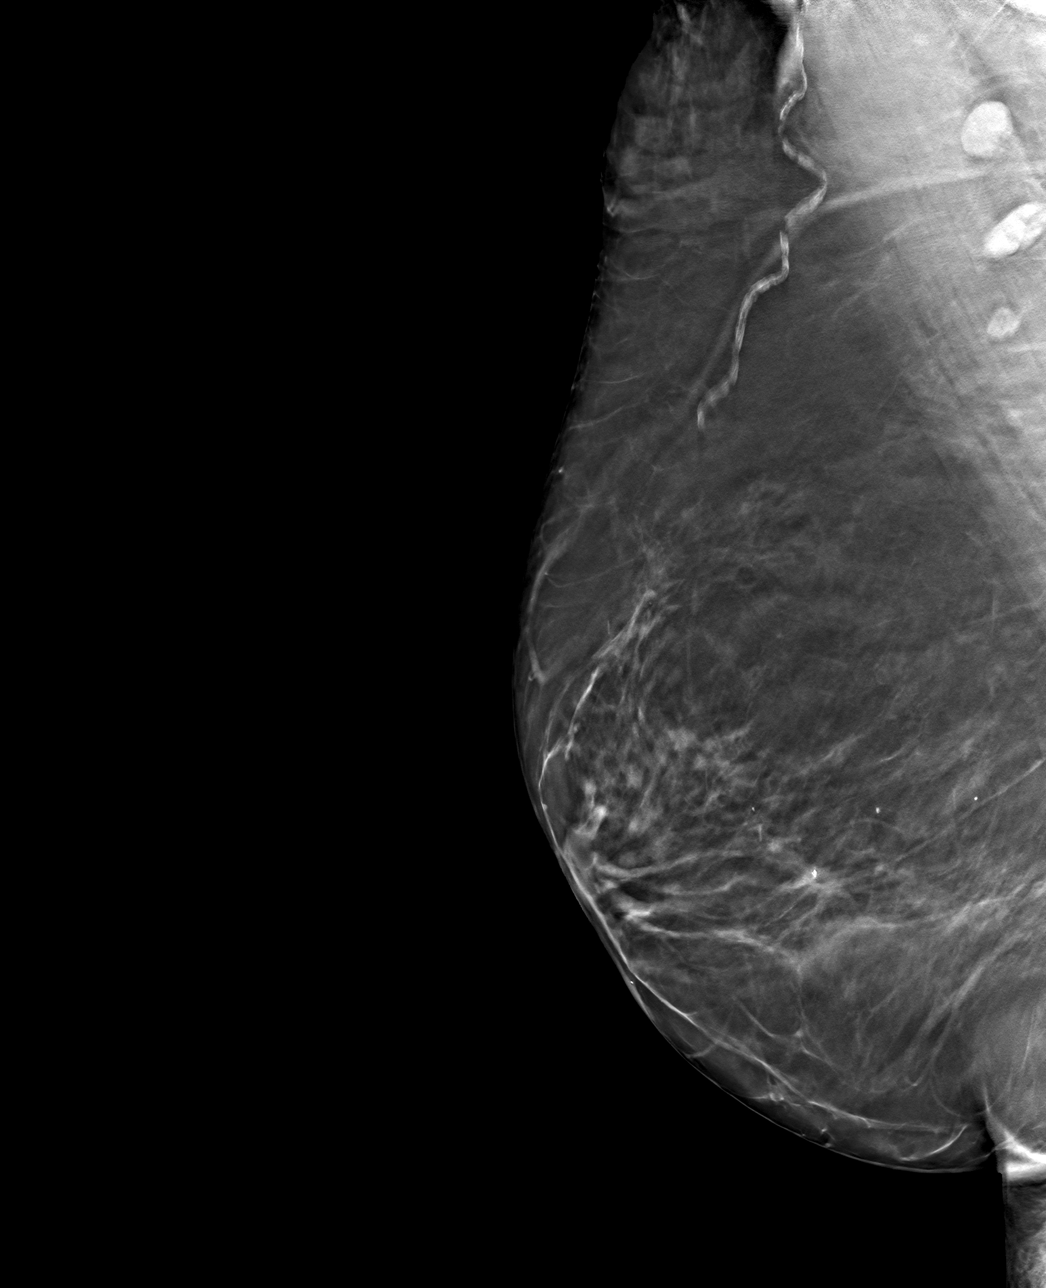

[R CC tomo · tomo slice 37/73.0]
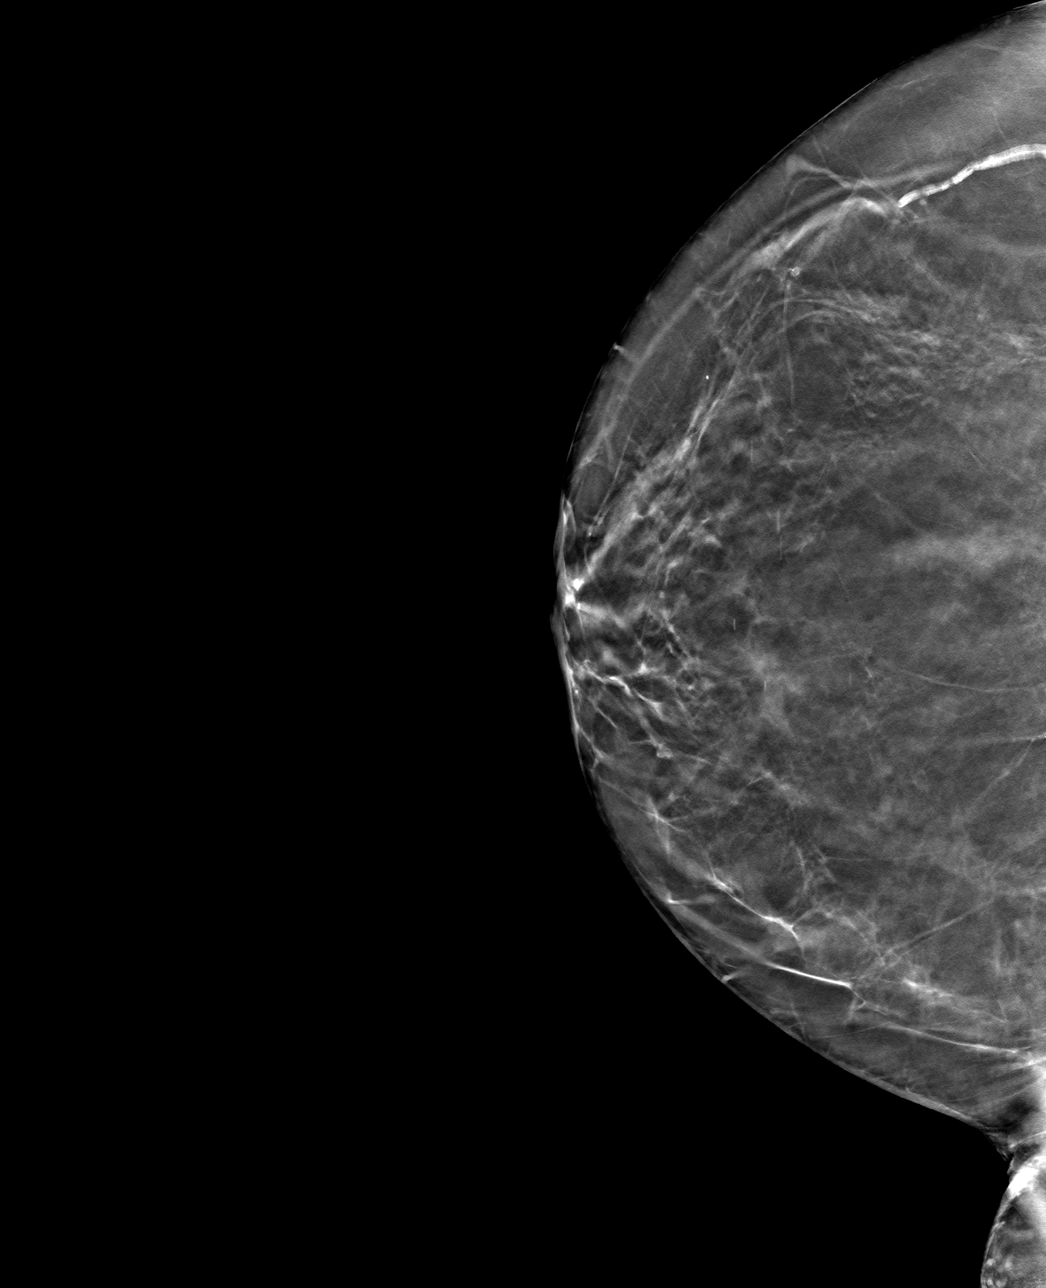

[4 of 12 positions shown; findings below may reference images not displayed]

ACR Breast Density Category b: There are scattered areas of
fibroglandular density.
FINDINGS: There are no findings suspicious for malignancy.
IMPRESSION: No mammographic evidence of malignancy. A result letter of this
screening mammogram will be mailed directly to the patient.

RECOMMENDATION:
Screening mammogram in one year. (Code:C3-I-RW2)

BI-RADS CATEGORY  1: Negative.

## 2021-11-10 ENCOUNTER — Emergency Department (HOSPITAL_COMMUNITY): Payer: Medicare Other

## 2021-11-10 ENCOUNTER — Encounter (HOSPITAL_COMMUNITY): Payer: Self-pay

## 2021-11-10 ENCOUNTER — Other Ambulatory Visit: Payer: Self-pay

## 2021-11-10 ENCOUNTER — Inpatient Hospital Stay (HOSPITAL_COMMUNITY)
Admission: EM | Admit: 2021-11-10 | Discharge: 2021-11-13 | DRG: 286 | Disposition: A | Discharge status: Home / Self Care | Payer: Medicare Other | Attending: Internal Medicine | Admitting: Internal Medicine

## 2021-11-10 DIAGNOSIS — I130 Hypertensive heart and chronic kidney disease with heart failure and stage 1 through stage 4 chronic kidney disease, or unspecified chronic kidney disease: Secondary | ICD-10-CM | POA: Diagnosis not present

## 2021-11-10 DIAGNOSIS — I71019 Dissection of thoracic aorta, unspecified: Secondary | ICD-10-CM | POA: Insufficient documentation

## 2021-11-10 DIAGNOSIS — I420 Dilated cardiomyopathy: Secondary | ICD-10-CM

## 2021-11-10 DIAGNOSIS — I5043 Acute on chronic combined systolic (congestive) and diastolic (congestive) heart failure: Secondary | ICD-10-CM

## 2021-11-10 DIAGNOSIS — I5041 Acute combined systolic (congestive) and diastolic (congestive) heart failure: Secondary | ICD-10-CM

## 2021-11-10 DIAGNOSIS — Z853 Personal history of malignant neoplasm of breast: Secondary | ICD-10-CM | POA: Diagnosis not present

## 2021-11-10 DIAGNOSIS — E1151 Type 2 diabetes mellitus with diabetic peripheral angiopathy without gangrene: Secondary | ICD-10-CM | POA: Diagnosis present

## 2021-11-10 DIAGNOSIS — Z87891 Personal history of nicotine dependence: Secondary | ICD-10-CM

## 2021-11-10 DIAGNOSIS — I160 Hypertensive urgency: Secondary | ICD-10-CM

## 2021-11-10 DIAGNOSIS — G4700 Insomnia, unspecified: Secondary | ICD-10-CM | POA: Diagnosis not present

## 2021-11-10 DIAGNOSIS — I43 Cardiomyopathy in diseases classified elsewhere: Secondary | ICD-10-CM | POA: Diagnosis present

## 2021-11-10 DIAGNOSIS — Z8249 Family history of ischemic heart disease and other diseases of the circulatory system: Secondary | ICD-10-CM

## 2021-11-10 DIAGNOSIS — Z8679 Personal history of other diseases of the circulatory system: Secondary | ICD-10-CM

## 2021-11-10 DIAGNOSIS — Z9012 Acquired absence of left breast and nipple: Secondary | ICD-10-CM

## 2021-11-10 DIAGNOSIS — Z952 Presence of prosthetic heart valve: Secondary | ICD-10-CM

## 2021-11-10 DIAGNOSIS — R0789 Other chest pain: Secondary | ICD-10-CM

## 2021-11-10 DIAGNOSIS — E1159 Type 2 diabetes mellitus with other circulatory complications: Secondary | ICD-10-CM

## 2021-11-10 DIAGNOSIS — D631 Anemia in chronic kidney disease: Secondary | ICD-10-CM | POA: Diagnosis present

## 2021-11-10 DIAGNOSIS — I351 Nonrheumatic aortic (valve) insufficiency: Secondary | ICD-10-CM

## 2021-11-10 DIAGNOSIS — E7800 Pure hypercholesterolemia, unspecified: Secondary | ICD-10-CM | POA: Diagnosis present

## 2021-11-10 DIAGNOSIS — Z79899 Other long term (current) drug therapy: Secondary | ICD-10-CM

## 2021-11-10 DIAGNOSIS — N1831 Chronic kidney disease, stage 3a: Secondary | ICD-10-CM

## 2021-11-10 DIAGNOSIS — I712 Thoracic aortic aneurysm, without rupture, unspecified: Secondary | ICD-10-CM | POA: Diagnosis not present

## 2021-11-10 DIAGNOSIS — E876 Hypokalemia: Secondary | ICD-10-CM | POA: Diagnosis not present

## 2021-11-10 DIAGNOSIS — Z7984 Long term (current) use of oral hypoglycemic drugs: Secondary | ICD-10-CM

## 2021-11-10 DIAGNOSIS — E119 Type 2 diabetes mellitus without complications: Secondary | ICD-10-CM | POA: Insufficient documentation

## 2021-11-10 DIAGNOSIS — I509 Heart failure, unspecified: Principal | ICD-10-CM

## 2021-11-10 DIAGNOSIS — R112 Nausea with vomiting, unspecified: Secondary | ICD-10-CM | POA: Diagnosis not present

## 2021-11-10 DIAGNOSIS — E1169 Type 2 diabetes mellitus with other specified complication: Secondary | ICD-10-CM | POA: Insufficient documentation

## 2021-11-10 DIAGNOSIS — I152 Hypertension secondary to endocrine disorders: Secondary | ICD-10-CM

## 2021-11-10 DIAGNOSIS — R001 Bradycardia, unspecified: Secondary | ICD-10-CM | POA: Diagnosis present

## 2021-11-10 DIAGNOSIS — E785 Hyperlipidemia, unspecified: Secondary | ICD-10-CM

## 2021-11-10 DIAGNOSIS — I340 Nonrheumatic mitral (valve) insufficiency: Secondary | ICD-10-CM

## 2021-11-10 DIAGNOSIS — Z9221 Personal history of antineoplastic chemotherapy: Secondary | ICD-10-CM

## 2021-11-10 DIAGNOSIS — E1122 Type 2 diabetes mellitus with diabetic chronic kidney disease: Secondary | ICD-10-CM | POA: Diagnosis present

## 2021-11-10 DIAGNOSIS — Z7982 Long term (current) use of aspirin: Secondary | ICD-10-CM

## 2021-11-10 DIAGNOSIS — I10 Essential (primary) hypertension: Secondary | ICD-10-CM | POA: Insufficient documentation

## 2021-11-10 HISTORY — DX: Celiac artery compression syndrome: I77.4

## 2021-11-10 HISTORY — DX: Nonrheumatic aortic (valve) insufficiency: I35.1

## 2021-11-10 HISTORY — DX: Chronic kidney disease, stage 3a: N18.31

## 2021-11-10 HISTORY — DX: Stricture of artery: I77.1

## 2021-11-10 HISTORY — DX: Cardiomyopathy, unspecified: I42.9

## 2021-11-10 HISTORY — DX: Nonrheumatic mitral (valve) insufficiency: I34.0

## 2021-11-10 LAB — CBC WITH DIFFERENTIAL/PLATELET
Abs Immature Granulocytes: 0.02 10*3/uL (ref 0.00–0.07)
Basophils Absolute: 0 10*3/uL (ref 0.0–0.1)
Basophils Relative: 0 %
Eosinophils Absolute: 0.2 10*3/uL (ref 0.0–0.5)
Eosinophils Relative: 3 %
HCT: 32.1 % — ABNORMAL LOW (ref 36.0–46.0)
Hemoglobin: 9.5 g/dL — ABNORMAL LOW (ref 12.0–15.0)
Immature Granulocytes: 0 %
Lymphocytes Relative: 16 %
Lymphs Abs: 1.1 10*3/uL (ref 0.7–4.0)
MCH: 26.1 pg (ref 26.0–34.0)
MCHC: 29.6 g/dL — ABNORMAL LOW (ref 30.0–36.0)
MCV: 88.2 fL (ref 80.0–100.0)
Monocytes Absolute: 0.5 10*3/uL (ref 0.1–1.0)
Monocytes Relative: 8 %
Neutro Abs: 4.7 10*3/uL (ref 1.7–7.7)
Neutrophils Relative %: 73 %
Platelets: 158 10*3/uL (ref 150–400)
RBC: 3.64 MIL/uL — ABNORMAL LOW (ref 3.87–5.11)
RDW: 15.4 % (ref 11.5–15.5)
WBC: 6.5 10*3/uL (ref 4.0–10.5)
nRBC: 0 % (ref 0.0–0.2)

## 2021-11-10 LAB — COMPREHENSIVE METABOLIC PANEL
ALT: 15 U/L (ref 0–44)
AST: 25 U/L (ref 15–41)
Albumin: 3.4 g/dL — ABNORMAL LOW (ref 3.5–5.0)
Alkaline Phosphatase: 67 U/L (ref 38–126)
Anion gap: 10 (ref 5–15)
BUN: 22 mg/dL (ref 8–23)
CO2: 22 mmol/L (ref 22–32)
Calcium: 9 mg/dL (ref 8.9–10.3)
Chloride: 107 mmol/L (ref 98–111)
Creatinine, Ser: 1.2 mg/dL — ABNORMAL HIGH (ref 0.44–1.00)
GFR, Estimated: 45 mL/min — ABNORMAL LOW (ref >60)
Glucose, Bld: 136 mg/dL — ABNORMAL HIGH (ref 70–99)
Potassium: 3.6 mmol/L (ref 3.5–5.1)
Sodium: 139 mmol/L (ref 135–145)
Total Bilirubin: 0.7 mg/dL (ref 0.3–1.2)
Total Protein: 6.9 g/dL (ref 6.5–8.1)

## 2021-11-10 LAB — GLUCOSE, CAPILLARY
Glucose-Capillary: 118 mg/dL — ABNORMAL HIGH (ref 70–99)
Glucose-Capillary: 137 mg/dL — ABNORMAL HIGH (ref 70–99)

## 2021-11-10 LAB — CBG MONITORING, ED: Glucose-Capillary: 99 mg/dL (ref 70–99)

## 2021-11-10 LAB — TROPONIN I (HIGH SENSITIVITY)
Troponin I (High Sensitivity): 20 ng/L — ABNORMAL HIGH (ref <18)
Troponin I (High Sensitivity): 24 ng/L — ABNORMAL HIGH (ref <18)

## 2021-11-10 LAB — BRAIN NATRIURETIC PEPTIDE: B Natriuretic Peptide: 2374.9 pg/mL — ABNORMAL HIGH (ref 0.0–100.0)

## 2021-11-10 MED ORDER — NITROGLYCERIN IN D5W 200-5 MCG/ML-% IV SOLN
0.0000 ug/min | INTRAVENOUS | Status: DC
  Administered 2021-11-10: 5 ug/min via INTRAVENOUS
  Filled 2021-11-10: qty 250

## 2021-11-10 MED ORDER — ASPIRIN 81 MG PO CHEW
81.0000 mg | CHEWABLE_TABLET | Freq: Every day | ORAL | Status: DC
  Administered 2021-11-11 – 2021-11-13 (×2): 81 mg via ORAL
  Filled 2021-11-10 (×2): qty 1

## 2021-11-10 MED ORDER — SIMVASTATIN 20 MG PO TABS
20.0000 mg | ORAL_TABLET | Freq: Every day | ORAL | Status: DC
  Administered 2021-11-11: 20 mg via ORAL
  Filled 2021-11-10: qty 1

## 2021-11-10 MED ORDER — INSULIN ASPART 100 UNIT/ML IJ SOLN: 0.0000 [IU] | Freq: Three times a day (TID) | INTRAMUSCULAR | Status: DC

## 2021-11-10 MED ORDER — LOSARTAN POTASSIUM 50 MG PO TABS: 100.0000 mg | ORAL_TABLET | Freq: Every day | ORAL | Status: DC

## 2021-11-10 MED ORDER — FUROSEMIDE 10 MG/ML IJ SOLN
20.0000 mg | Freq: Two times a day (BID) | INTRAMUSCULAR | Status: AC
  Administered 2021-11-10 – 2021-11-11 (×2): 20 mg via INTRAVENOUS
  Filled 2021-11-10 (×2): qty 2

## 2021-11-10 MED ORDER — NITROGLYCERIN 0.4 MG SL SUBL
0.4000 mg | SUBLINGUAL_TABLET | SUBLINGUAL | Status: DC | PRN
  Administered 2021-11-10 (×2): 0.4 mg via SUBLINGUAL
  Filled 2021-11-10: qty 1

## 2021-11-10 MED ORDER — ENOXAPARIN SODIUM 40 MG/0.4ML IJ SOSY
40.0000 mg | PREFILLED_SYRINGE | INTRAMUSCULAR | Status: DC
  Administered 2021-11-10 – 2021-11-12 (×3): 40 mg via SUBCUTANEOUS
  Filled 2021-11-10 (×3): qty 0.4

## 2021-11-10 MED ORDER — ASPIRIN 81 MG PO CHEW
324.0000 mg | CHEWABLE_TABLET | Freq: Once | ORAL | Status: AC
  Administered 2021-11-10: 324 mg via ORAL
  Filled 2021-11-10: qty 4

## 2021-11-10 MED ORDER — ONDANSETRON HCL 4 MG/2ML IJ SOLN: 4.0000 mg | Freq: Four times a day (QID) | INTRAMUSCULAR | Status: DC | PRN

## 2021-11-10 MED ORDER — FUROSEMIDE 10 MG/ML IJ SOLN
20.0000 mg | Freq: Once | INTRAMUSCULAR | Status: AC
  Administered 2021-11-10: 20 mg via INTRAVENOUS
  Filled 2021-11-10: qty 2

## 2021-11-10 MED ORDER — LOSARTAN POTASSIUM 50 MG PO TABS
100.0000 mg | ORAL_TABLET | Freq: Every day | ORAL | Status: DC
Start: 2021-11-10 — End: 2021-11-13
  Administered 2021-11-10 – 2021-11-12 (×3): 100 mg via ORAL
  Filled 2021-11-10 (×3): qty 2

## 2021-11-10 MED ORDER — ACETAMINOPHEN 325 MG PO TABS: 650.0000 mg | ORAL_TABLET | ORAL | Status: DC | PRN

## 2021-11-10 MED ORDER — LISINOPRIL 20 MG PO TABS: 40.0000 mg | ORAL_TABLET | Freq: Every day | ORAL | Status: DC

## 2021-11-10 MED ORDER — CARVEDILOL 6.25 MG PO TABS
6.2500 mg | ORAL_TABLET | Freq: Two times a day (BID) | ORAL | Status: DC
Start: 2021-11-10 — End: 2021-11-13
  Administered 2021-11-10 – 2021-11-13 (×6): 6.25 mg via ORAL
  Filled 2021-11-10: qty 1
  Filled 2021-11-10 (×4): qty 2
  Filled 2021-11-10: qty 1

## 2021-11-10 MED ORDER — RAMELTEON 8 MG PO TABS
8.0000 mg | ORAL_TABLET | Freq: Once | ORAL | Status: AC | PRN
  Administered 2021-11-11: 8 mg via ORAL
  Filled 2021-11-10: qty 1

## 2021-11-10 NOTE — ED Provider Notes (Signed)
[facility] EMERGENCY DEPARTMENT Provider Note   CSN: [id number] Arrival date & time: 11/10/21  1308     History  Chief Complaint  Patient presents with   Shortness of Breath    Brittany Greene is a 81 y.o. 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  Presents to the emergency department with a chief complaint of chest pain and shortness of breath.  Patient states that yesterday evening around 9 to 10 PM she was laying in bed when she had a sudden onset of chest pain and shortness of breath.  Patient describes pain as a pressure.  Pressure is located to mid sternum and does not radiate.  Patient reported that pain was initially 10/10 on the pain scale however has gradually improved and is now 6/10 on the pain scale.  Patient denied having any associated nausea, vomiting, diaphoresis, is, lightheadedness, or syncope.  Patient does report that she had 1 episode of vomiting this morning approximately 30 minutes after taking her morning medications.  Patient describes emesis as stomach contents with minimal spots of bright red blood.     Shortness of Breath Associated symptoms: chest pain   Associated symptoms: no abdominal pain, no fever, no headaches, no neck pain, no rash and no vomiting        Home Medications Prior to Admission medications   Medication Sig Start Date End Date Taking? Authorizing Provider  amLODipine (NORVASC) 10 MG tablet Take 10 mg by mouth daily.     [provider]  docusate sodium (COLACE) 100 MG capsule Take 1 capsule (100 mg total) by mouth daily. 08/01/16   Crissie Reese, MD  doxycycline (VIBRA-TABS) 100 MG tablet Take 1 tablet (100 mg total) by mouth every 12 (twelve) hours. 08/01/16   Crissie Reese, MD   fluticasone [facility]) 50 MCG/ACT nasal spray Place 2 sprays into both nostrils daily as needed for allergies.  07/13/16   [provider]  GLIPIZIDE XL 10 MG 24 hr tablet 10 mg. 01/06/19   [provider]  HYDROmorphone (DILAUDID) 2 MG tablet Take 1-2 tablets (2-4 mg total) by mouth every 4 (four) hours as needed for moderate pain or severe pain. 08/01/16   Crissie Reese, MD  lisinopril (ZESTRIL) 10 MG tablet Take 10 mg by mouth daily.     [provider]  methocarbamol (ROBAXIN) 500 MG tablet Take 1 tablet (500 mg total) by mouth every 8 (eight) hours as needed for muscle spasms. 08/01/16   Crissie Reese, MD  metoprolol tartrate (LOPRESSOR) 25 MG tablet Take 12.5 mg by mouth 2 (two) times daily.    [provider]  polyethylene glycol (MIRALAX / GLYCOLAX) packet Take 17 g by mouth daily as needed for moderate constipation.    [provider]  simvastatin (ZOCOR) 20 MG tablet Take 20 mg by mouth daily.    [provider]      Allergies    No known allergies    Review of Systems   Review of Systems  Constitutional:  Negative for chills and fever.  Respiratory:  Positive for shortness of breath.   Cardiovascular:  Positive for chest pain. Negative for palpitations and leg swelling.  Gastrointestinal:  Negative for abdominal pain, nausea and vomiting.  Musculoskeletal:  Negative for back pain and neck pain.  Skin:  Negative for  color change and rash.  Neurological:  Negative for dizziness, syncope, light-headedness and headaches.  Psychiatric/Behavioral:  Negative for confusion.     Physical Exam Updated Vital Signs BP (!) 188/83 (BP Location: Right Arm)   Pulse (!) 58   Temp 97.8 F (36.6 C) (Oral)   Resp 20   SpO2 97%  Physical Exam Vitals and nursing note reviewed.  Constitutional:      General: She is not in acute distress.    Appearance: She is not ill-appearing, toxic-appearing or diaphoretic.  HENT:     Head: Normocephalic.   Eyes:     General: No scleral icterus.       Right eye: No discharge.        Left eye: No discharge.  Cardiovascular:     Rate and Rhythm: Normal rate.     Pulses:          Radial pulses are 2+ on the right side and 2+ on the left side.     Heart sounds: Murmur heard.     Systolic murmur is present.     Comments: Holosystolic heart murmur Pulmonary:     Effort: Pulmonary effort is normal. No tachypnea, bradypnea or respiratory distress.     Breath sounds: Examination of the right-lower field reveals rales. Examination of the left-lower field reveals rales. Rales present.     Comments: Speaks in full complete sentences without difficulty Abdominal:     General: Abdomen is flat. There is no distension.     Palpations: Abdomen is soft. There is no mass or pulsatile mass.     Tenderness: There is no abdominal tenderness. There is no guarding or rebound.  Musculoskeletal:     Cervical back: Neck supple.     Right lower leg: Normal.     Left lower leg: Normal.  Skin:    General: Skin is warm and dry.  Neurological:     General: No focal deficit present.     Mental Status: She is alert.  Psychiatric:        Behavior: Behavior is cooperative.     ED Results / Procedures / Treatments   Labs (all labs ordered are listed, but only abnormal results are displayed) Labs Reviewed  BRAIN NATRIURETIC PEPTIDE - Abnormal; Notable for the following components:      Result Value   B Natriuretic Peptide 2,374.9 (*)    All other components within normal limits  COMPREHENSIVE METABOLIC PANEL - Abnormal; Notable for the following components:   Glucose, Bld 136 (*)    Creatinine, Ser 1.20 (*)    Albumin 3.4 (*)    GFR, Estimated 45 (*)    All other components within normal limits  CBC WITH DIFFERENTIAL/PLATELET - Abnormal; Notable for the following components:   RBC 3.64 (*)    Hemoglobin 9.5 (*)    HCT 32.1 (*)    MCHC 29.6 (*)    All other components within normal limits  TROPONIN I  (HIGH SENSITIVITY) - Abnormal; Notable for the following components:   Troponin I (High Sensitivity) 20 (*)    All other components within normal limits  TROPONIN I (HIGH SENSITIVITY)    EKG None  Radiology DG Chest Portable 1 View  Result Date: 11/10/2021 CLINICAL DATA:  81 year old female with history of chest pain. EXAM: PORTABLE CHEST 1 VIEW COMPARISON:  Chest x-ray 11/30/2014. FINDINGS: There is cephalization of the pulmonary vasculature and slight indistinctness of the interstitial markings suggestive of mild pulmonary edema. No definite pleural  effusions. Mild cardiomegaly. Severe tortuosity of the chronically dilated thoracic aorta with endograft noted. Status post median sternotomy. IMPRESSION: 1. The appearance the chest suggests mild congestive heart failure, as above. 2. Postoperative and postprocedural changes, as above. Electronically Signed   By: Vinnie Langton M.D.   On: 11/10/2021 10:32    Procedures .Critical Care  Performed by: Loni Beckwith, PA-C Authorized by: Loni Beckwith, PA-C   Critical care provider statement:    Critical care time (minutes):  30   Critical care was necessary to treat or prevent imminent or life-threatening deterioration of the following conditions:  Cardiac failure   Critical care was time spent personally by me on the following activities:  Development of treatment plan with patient or surrogate, evaluation of patient's response to treatment, examination of patient, ordering and review of laboratory studies, ordering and review of radiographic studies, ordering and performing treatments and interventions, pulse oximetry, re-evaluation of patient's condition and review of old charts   Care discussed with: admitting provider       Medications Ordered in ED Medications  nitroGLYCERIN (NITROSTAT) SL tablet 0.4 mg (0.4 mg Sublingual Given 11/10/21 1105)  furosemide (LASIX) injection 20 mg (has no administration in time range)   nitroGLYCERIN 50 mg in dextrose 5 % 250 mL (0.2 mg/mL) infusion (has no administration in time range)  aspirin chewable tablet 324 mg (324 mg Oral Given 11/10/21 0948)    ED Course/ Medical Decision Making/ A&P Clinical Course as of 11/10/21 1208  Sun Nov 10, 2021  1157 I spoke to Dr. Darrick Meigs with internal medicine team who will see the patient for admission. [PB]    Clinical Course User Index [PB] Loni Beckwith, PA-C                           Medical Decision Making Amount and/or Complexity of Data Reviewed Labs: ordered. Radiology: ordered.  Risk OTC drugs. Prescription drug management. Decision regarding hospitalization.   Alert 81 year old female in no acute stress, nontoxic-appearing.  Presents emergency department for complaint of chest pain and shortness of breath.  Information is obtained from patient.  I reviewed patient's past medical records including previous provider notes from specialist, outpatient providers, labs, and imaging.  Patient has medical history as outlined in HPI which complicates her care.  Per chart review patient has aortic dissection status post repair as indicated in HPI.  Patient is followed by cardiothoracic surgery team in [facility].  Per chart review patient saw her CT surgery team 08/2021.  Patient had TTE performed at that time which showed EF 35 to 40% with moderate mitral valve regurgitation.  The patient's reports of chest pain and shortness differential includes but is not limited to ACS, acute CHF exacerbation, aortic dissection.  ACS work-up initiated.  I personally viewed and interpreted patient's EKG.  Tracing shows sinus rhythm with LVH.  I personally viewed interpret patient's chest x-ray which shows mild pulm edema, mild cardiomegaly, postoperative and postprocedural changes.  I personally viewed interpret patient's lab results.  Pertinent findings include: -Troponin 20 -BNP 2374 -Hemoglobin 9.5 -Creatinine  1.20  With pulm edema seen on chest x-ray and markedly elevated BNP in the setting of decreased EF suspect patient's shortness of breath is secondary to acute CHF exacerbation.  Patient is noted to be hypertensive which is likely contributing to acute CHF exacerbation.  Will start nitro drip to decrease patient's blood pressure.  We will give patient 20  mg dose of IV Lasix.  Orders for monitoring strict ins and outs placed.  Will consult hospitalist team for admission.  Results were discussed with patient and patient's family members at bedside.  Patient care discussed with attending physician Dr.Pickering.    I spoke with Dr. Staci Acosta with internal medicine team who will see the patient for admission.        Final Clinical Impression(s) / ED Diagnoses Final diagnoses:  Acute congestive heart failure, unspecified heart failure type [facility])    Rx / DC Orders ED Discharge Orders     None         Dyann Ruddle 11/10/21 1209    Davonna Belling, MD 11/11/21 1553

## 2021-11-10 NOTE — H&P (Cosign Needed Addendum)
NAMEShalea Greene, MRN:  [id number], DOB:  1940-09-03, LOS: 0 ADMISSION DATE:  11/10/2021, Primary: Brittany Beals, NP  CHIEF COMPLAINT: Chest pain/shortness of breath  Medical Service: Internal Medicine Teaching Service         Attending Physician: Dr. Charise Killian, MD    First Contact: Dr. Jeanice Lim Pager: [phone]  Second Contact: Dr. Alfonse Spruce Pager: [phone]       After Hours (After 5p/  First Contact Pager: [phone]  weekends / holidays): Second Contact Pager: [phone]    History of present illness   Brittany Greene is an 81 year old female with a significant cardiovascular history including cardiomyopathy with reduced EF, hypertension, hyperlipidemia, aortic aneurysm repair and AVR (2016) left carotid-subclavian bypass and TEVAR (2019) who is presenting for sudden onset chest pressure and shortness of breath that occurred last evening around 2100 while lying in bed.  Since that time, chest pain is slowly improved.  She attempted to change positions and sit up as well as walk around which did not change her symptoms.  Symptoms have been slowly improving since onset.  She denies experiencing prior chest pain.  Denies prior stress test.  She does note that she has felt somewhat more tired over the past 2 weeks but attributed this to her age.  She denies dyspnea, orthopnea, increased abdominal girth or lower extremity swelling.  She notes that lisinopril was increased back in April due to progressive hypertension however denies any other medication changes recently.  Denies dizziness or lightheadedness.  Chart review She follows with cardiothoracic surgery and [facility] at [facility].  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  She went underwent an echocardiogram in April as well which showed LVEF of 35%, moderate mitral valve  regurgitation, mild aortic regurg, and severe biatrial dilation.  Past Medical History  She,  has a past medical history of AA (aortic aneurysm) ([place]), Anemia, Aortic regurgitation, Cancer of left breast ([place]), Cardiomyopathy ([place]), Celiac artery stenosis ([place]), Chronic kidney disease, stage 3a ([place]), High cholesterol, History of blood transfusion, Hypertension, Mitral regurgitation, Personal history of chemotherapy (2004), Pneumonia, and Type II diabetes mellitus ([place]).   Home Medications     Prior to Admission medications   Medication Sig Start Date End Date Taking? Authorizing Provider  aspirin 81 MG chewable tablet Chew 81 mg by mouth daily.   Yes [provider]  carvedilol (COREG) 6.25 MG tablet Take 6.25 mg by mouth 2 (two) times daily with a meal.   Yes [provider]  GLIPIZIDE XL 10 MG 24 hr tablet 10 mg. 01/06/19  Yes [provider]  simvastatin (ZOCOR) 20 MG tablet Take 20 mg by mouth daily.   Yes [provider]  lisinopril (ZESTRIL) 40 MG tablet Take 40 mg by mouth daily. 09/11/21   [provider]    Allergies    Allergies as of 11/10/2021 - Review Complete 01/18/2019  Allergen Reaction Noted   No known allergies  07/30/2016    Social History   reports that she quit smoking about 18 years ago. Her smoking use included cigarettes. She has a 22.50 pack-year smoking history. She has never used smokeless tobacco. She reports current alcohol use. She reports that she does not use drugs.   Family History   Family history reviewed.  No pertinent family history Objective   Blood pressure (!) 178/88, pulse  65, temperature 97.8 F (36.6 C), temperature source Oral, resp. rate (!) 25, SpO2 95 %.    General: Well-appearing female resting comfortably on the stretcher HEENT: Moist mucous membranes Cardiac: Bradycardic rate, regular rhythm, systolic murmur, no lower extremity edema, JVD difficult to assess Pulm: Breathing comfortably on  room air, lung sounds are clear GI: Abdomen soft, nontender Skin: Warm and dry Neuro: Alert and oriented x4 Significant Diagnostic Tests:       Latest Ref Rng & Units 11/10/2021   10:21 AM 07/25/2016    4:00 PM 11/30/2014   10:30 PM  CBC  WBC 4.0 - 10.5 K/uL 6.5  6.5  10.3   Hemoglobin 12.0 - 15.0 g/dL 9.5  10.6  9.7   Hematocrit 36.0 - 46.0 % 32.1  33.9  29.9   Platelets 150 - 400 K/uL 158  260  253       Latest Ref Rng & Units 11/10/2021   10:21 AM 07/25/2016    4:00 PM 11/30/2014   10:30 PM  BMP  Glucose 70 - 99 mg/dL 136  94  154   BUN 8 - 23 mg/dL '22  17  18   '$ Creatinine 0.44 - 1.00 mg/dL 1.20  1.08  1.30   Sodium 135 - 145 mmol/L 139  139  138   Potassium 3.5 - 5.1 mmol/L 3.6  3.1  3.0   Chloride 98 - 111 mmol/L 107  102  101   CO2 22 - 32 mmol/L '22  28  24   '$ Calcium 8.9 - 10.3 mg/dL 9.0  9.5  9.2     Summary  81 year old female requiring admission for chest pain evaluation  Assessment & Plan:   Atypical chest pain Exacerbation of Cardiomyopathy with reduced EF (35%) Hypertension Bradycardia Atherosclerotic disease status post aortic aneurysm repair and AVR (2016), left carotid-subclavian bypass and TEVAR (2019)  Assessment: Her presenting symptoms could be attributable to some flash pulmonary edema, possibly in the setting of increased blood pressure that could be due to hypervolemia in the setting of CHF exacerbation.  She has also been taking Celebrex and Mobic which could be exacerbating her hypertension.   She was placed on a nitroglycerin drip in the ED due to persistent elevations in her blood pressure.  I do not appreciate any new EKG changes and troponins are relatively stable at 20 and 24 respectively.   She underwent a left heart cath in 2019 which reportedly did not reveal any significant disease however given her significant history, I would not rule out ischemic causes of her chest pain at this point. Low suspicion for ACS however will ask for cardiology  evaluation given her complex cardiovascular history.  She denies a prior history of CHF although her recent echocardiogram in April of this year showed a new reduction in EF to 35% compared to greater than 55% in 2022.  She does not appear grossly volume overloaded on exam however chest x-ray suggests some mild pulmonary edema and BNP is markedly elevated at 2375.  Plan - Admit to cardiac telemetry - Cardiology consulted and agreed to see - We will discontinue the nitroglycerin drip and attempt to manage her blood pressures by resuming her home lisinopril and managing her volume status - Hold Coreg given her bradycardia - Received 20 mg of IV Lasix in the ED, will give her 2 more doses and reassess her volume status in the morning - Check an echocardiogram - Continuous telemetry monitoring - Continue statin and aspirin  Type 2 diabetes mellitus - Hold home glipizide - SSI  CKD 3A.  Chronic and stable.  Daily BMPs Best practice:  CODE STATUS: Full DVT for prophylaxis: Lovenox Social considerations/Family communication: Family updated at bedside Dispo: Admit patient to Observation with expected length of stay less than 2 midnights.   Mitzi Hansen, MD Internal Medicine Resident PGY-3 [facility] Pager: [phone] 11/10/2021 1:39 PM

## 2021-11-10 NOTE — Consult Note (Addendum)
Cardiology Consultation:   Patient ID: Brittany Greene MRN: [id number]; DOB: [birth year]  Admit date: 11/10/2021 Date of Consult: 11/10/2021  PCP:  Everardo Beals, NP   [facility] Providers Cardiologist:  New Click here to update MD or APP on Care Team, Refresh:1}     Patient Profile:   Brittany Greene is a 81 y.o. 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 Darrick Meigs.  History of Present Illness:   Ms. Whidbee moved down here from [place] several years ago. Shas been primarily followed by vascular surgery/cardiothoracic surgery at [facility] with above vascular history. She recalls having a cardiologist prior to moving here but states she was told she no longer needed one after following with her vascular team. Remote cath 2016 is outlined below with only 20-30% LAD of significance. She was recently seen by vascular 08/2021 for routine follow-up, overall felt to be doing well. 2D echo 09/05/21 was noted to have shown moderate MR for which yearly surveillance was recommended; study also noted EF 71-24%, grade 3 diastolic dysfunction, mild-moderate AI, severely dilated LA/RA, normal RV, aorta at sinuses of Valsalva and sinotubular junction mildly dilated, and suspected mildly elevated RA pressure. The LV dysfunction appears new compared to 2022. She has not had a CHF exacerbation in the past and has not required diuretics. Mother and grandmother both had heart failure.  Yesterday evening she developed sensation of steadily worsening shortness of breath  accompanied by a sense of chest tightness as if she could not fully catch her breath. She denied any overt chest pain. She tossed and turned, even trying an old albuterol inhaler she'd had when she was sick, without any specific relief, unable to get comfortable in bed. She took her morning medicines including lisinopril but vomited them up and noted some scant minimal spots of blood. Finally around 7am she called EMS. She presented to [facility] with SBP 200/90, 96% RA. CXR suggestive of mild CHF, BNP 2374, hsTroponin 20->24, Cr 1.20 similar to prior, Hgb 9.5 (down from 11.2 in 2019). She states she had relief of symptoms after second SL NTG and steadily began to feel better after getting IV Lasix ('20mg'$ ) / IV NTG and having excellent UOP (current jug 748m). IV NTG is now off. Also got '324mg'$  ASA. She is currently feeling better without acute complaint. Denies any prior similar symptoms or worsening edema.    Past Medical History:  Diagnosis Date   AA (aortic aneurysm) (HCC)    hx of aortic aneurysm repair 02/2015 with redo sternotomy with debranching of innominate and carotid arteries 09/2017, left carotid-subclavian bypass and TEVAR 09/2017   Anemia    Aortic regurgitation    Cancer of left breast ([facility]    Cardiomyopathy ([facility]    Celiac artery stenosis (HCC)    Chronic kidney disease, stage 3a ([facility]    High cholesterol    History of blood transfusion    "related to surgery"   Hypertension    Mitral regurgitation    Personal history of chemotherapy 2004   Pneumonia    Type II diabetes mellitus ([facility]  dx 2006    Past Surgical History:  Procedure Laterality Date   BACK SURGERY     BREAST BIOPSY Left 2003   BREAST CAPSULECTOMY WITH IMPLANT EXCHANGE Left 07/31/2016   Procedure: LEFT BREAST TOTAL CAPSULECTOMY REMOVE IMPLANT WITH DELAYED RECONSTRUCTION WITH PLACMENT WITH SILICONE GEL IMPLANTS;  Surgeon: Crissie Reese, MD;  Location: [place];  Service: Plastics;  Laterality: Left;   BREAST  RECONSTRUCTION Left 07/31/2016   BREAST TOTAL CAPSULECTOMY REMOVE IMPLANT WITH DELAYED RECONSTRUCTION WITH PLACMENT WITH SILICONE GEL IMPLANTS    LUMBAR MICRODISCECTOMY  ~ 1990   MASTECTOMY Left 2003   MASTOPEXY Right 07/31/2016   MASTOPEXY Right 07/31/2016   Procedure: RIGHT MASTOPEXY;  Surgeon: Crissie Reese, MD;  Location: [place];  Service: Plastics;  Laterality: Right;   NASAL SINUS SURGERY  1990s   THORACIC AORTIC ANEURYSM REPAIR  03/06/2015   TONSILLECTOMY     TUBAL LIGATION       Home Medications:  Prior to Admission medications   Medication Sig Start Date End Date Taking? Authorizing Provider  aspirin 81 MG chewable tablet Chew 81 mg by mouth daily.   Yes [provider]  carvedilol (COREG) 6.25 MG tablet Take 6.25 mg by mouth 2 (two) times daily with a meal.   Yes [provider]  GLIPIZIDE XL 10 MG 24 hr tablet 10 mg. 01/06/19  Yes [provider]  simvastatin (ZOCOR) 20 MG tablet Take 20 mg by mouth daily.   Yes [provider]  lisinopril (ZESTRIL) 40 MG tablet Take 40 mg by mouth daily. 09/11/21   [provider]    Inpatient Medications: Scheduled Meds:  [START ON 11/11/2021] aspirin  81 mg Oral Daily   enoxaparin (LOVENOX) injection  40 mg Subcutaneous Q24H   furosemide  20 mg Intravenous BID   insulin aspart  0-9 Units Subcutaneous TID WC   [START ON 11/11/2021] lisinopril  40 mg Oral Daily   [START ON 11/11/2021] simvastatin  20 mg Oral Daily   Continuous Infusions:  PRN Meds: acetaminophen, ondansetron (ZOFRAN) IV  Allergies:    Allergies  Allergen Reactions   No Known Allergies     Social History:   Social History   Socioeconomic History   Marital status: Widowed    Spouse name: Not on file   Number of children: Not on file   Years of education: Not on file   Highest education level: Not on file  Occupational History   Occupation: retired  Tobacco Use   Smoking status: Former    Packs/day: 0.50    Years:  45.00    Total pack years: 22.50    Types: Cigarettes    Quit date: 11/25/2002    Years since quitting: 18.9   Smokeless tobacco: Never  Vaping Use   Vaping Use: Never used  Substance and Sexual Activity   Alcohol use: Yes    Comment: 07/31/2016 "I'll have a couple drinks on holidays or special occasions"   Drug use: No   Sexual activity: Not on file  Other Topics Concern   Not on file  Social History Narrative   Not on file   Social Determinants of Health   Financial Resource Strain: Not on file  Food Insecurity: Not on file  Transportation Needs: Not on file  Physical Activity: Not on file  Stress: Not on file  Social Connections: Not on file  Intimate Partner Violence: Not on file    Family History:   Family History  Problem Relation Age  of Onset   Heart failure Mother      ROS:  Please see the history of present illness.   All other ROS reviewed and negative.     Physical Exam/Data:   Vitals:   11/10/21 1130 11/10/21 1154 11/10/21 1200 11/10/21 1250  BP: (!) 177/83 (!) 183/99 (!) 174/91 (!) 178/88  Pulse: (!) 57 60 (!) 58 65  Resp: (!) 22 (!) 25 (!) 21 (!) 25  Temp:      TempSrc:      SpO2: 95% 95% 95% 95%   No intake or output data in the 24 hours ending 11/10/21 1403    01/18/2019    2:19 PM 07/31/2016   11:55 AM 07/25/2016    2:41 PM  Last 3 Weights  Weight (lbs) 149 lb 2 oz 155 lb 6.8 oz 156 lb 3.2 oz  Weight (kg) 67.643 kg 70.5 kg 70.852 kg     There is no height or weight on file to calculate BMI.  General: Well developed, well nourished F in no acute distress. Head: Normocephalic, atraumatic, sclera non-icteric, no xanthomas, nares are without discharge. Neck: Negative for carotid bruits. JVP not elevated. Lungs: Diminished with quiet rales at bases, no wheezes or rhonchi. Breathing is unlabored. Heart: RRR S1 S2, soft SEM without rubs, or gallops.  Abdomen: Soft, non-tender, non-distended with normoactive bowel sounds. No  rebound/guarding. Extremities: No clubbing or cyanosis. No edema. Distal pedal pulses are 2+ and equal bilaterally. Neuro: Alert and oriented X 3. Moves all extremities spontaneously. Psych:  Responds to questions appropriately with a normal affect.   EKG:  The EKG was personally reviewed and demonstrates:  sinus bradycardia 58bpm, first degree AVB, LVH with possible repolarization changes with TWI I, II, avL, V5-V6   Telemetry:  Telemetry was personally reviewed and demonstrates:  NSR  Relevant CV Studies:  2D echo 08/2021 Care Everywhere  Summary    1. The left ventricle is mildly dilated in size with mildly increased wall  thickness.    2. The left ventricular systolic function is moderately decreased, LVEF is  visually estimated at 35%.    3. The mitral valve leaflets are mildly thickened with normal leaflet  mobility.    4. There is moderate mitral valve regurgitation.    5. The aortic valve is trileaflet with mildly thickened leaflets with normal  excursion.    6. There is mild aortic regurgitation.    7. The aorta at the sinuses of Valsalva and sinotubular junction is mildly  dilated.    8. The left atrium is severely dilated in size.    9. The right ventricle is normal in size, with normal systolic function.    10. The right atrium is severely dilated  in size.    CT Angio 08/2021  Care Everywhere 1. Sequela of ascending aorta repair, endovascular aortic repair, aortic debranching, and great vessel reimplantation.  2. Poor apposition of the proximal stent graft, similar to prior.  3. Persistent type II endoleak, likely from an adjacent intercostal artery.  4. Stable fusiform dilatation and marked tortuosity of the thoracoabdominal aorta, bilateral common iliac arteries, and left internal iliac arteries.  5. Dilatation the central pulmonary arteries, which can be seen in the setting of pulmonary hypertension.  6. Severe stenosis of the proximal celiac artery with  poststenotic dilatation which can be seen in the setting of celiac artery compression syndrome.  2D Echo 06/2020  Care Everywhere Summary    1. The left ventricle is normal  in size with mildly increased wall  thickness.    2. The left ventricular systolic function is normal, LVEF is visually  estimated at > 55%.    3. There is grade II diastolic dysfunction (elevated filling pressure).    4. There is mild aortic regurgitation.    5. The left atrium is moderately dilated in size.    6. The right ventricle is normal in size, with normal systolic function.    7. The right atrium is mildly dilated  in size.    8. The aorta at the sinuses of Valsalva and ascending aorta is mildly  dilated.     Cardiac Cath 2016 Care Everywhere FINDINGS   Hemodynamics and Left Heart Catheterization   Aortic pressure: 126/72 mmHg   Left ventricular filling pressure (LVEDP = 12 mm Hg).   Coronary Angiography  Dominance: Right   Left main (LMCA): LMCA is a large-caliber vessel that originates from the  left coronary sinus. It bifurcates into the left anterior descending (LAD)  and left circumflex (LCx) arteries. There is no angiographic evidence of  significant disease in the LMCA.   Left anterior descending (LAD): The LAD is a medium-caliber tortuous  vessel that gives off two medium-sized tortouous diagonal (D) branches  before it wraps around the apex. There are mild luminal irregularities  with maximum 20-30% occlusion of LAD.   Circumflex (LCx): The LCx is a medium-caliber tortuous vessel that gives  off two obtuse marginal (OM) branches and then continues as a small vessel  in the AV groove. There is no angiographic evidence of significant disease  in the LCx.   Right Coronary (RCA): The RCA is a large-caliber vessel originating from  the right coronary sinus. It gives off one large RV marginal artery before  it bifurcates distally into a medium posterior descending artery (PDA) and  a  posterolateral branch (PL) consistent with a right dominant system.  There are mild luminal irregularities in the RCA.   Laboratory Data:  High Sensitivity Troponin:   Recent Labs  Lab 11/10/21 1021 11/10/21 1205  TROPONINIHS 20* 24*     Chemistry Recent Labs  Lab 11/10/21 1021  NA 139  K 3.6  CL 107  CO2 22  GLUCOSE 136*  BUN 22  CREATININE 1.20*  CALCIUM 9.0  GFRNONAA 45*  ANIONGAP 10    Recent Labs  Lab 11/10/21 1021  PROT 6.9  ALBUMIN 3.4*  AST 25  ALT 15  ALKPHOS 67  BILITOT 0.7   Lipids No results for input(s): "CHOL", "TRIG", "HDL", "LABVLDL", "LDLCALC", "CHOLHDL" in the last 168 hours.  Hematology Recent Labs  Lab 11/10/21 1021  WBC 6.5  RBC 3.64*  HGB 9.5*  HCT 32.1*  MCV 88.2  MCH 26.1  MCHC 29.6*  RDW 15.4  PLT 158   Thyroid No results for input(s): "TSH", "FREET4" in the last 168 hours.  BNP Recent Labs  Lab 11/10/21 1021  BNP 2,374.9*    DDimer No results for input(s): "DDIMER" in the last 168 hours.   Radiology/Studies:  DG Chest Portable 1 View  Result Date: 11/10/2021 CLINICAL DATA:  81 year old female with history of chest pain. EXAM: PORTABLE CHEST 1 VIEW COMPARISON:  Chest x-ray 11/30/2014. FINDINGS: There is cephalization of the pulmonary vasculature and slight indistinctness of the interstitial markings suggestive of mild pulmonary edema. No definite pleural effusions. Mild cardiomegaly. Severe tortuosity of the chronically dilated thoracic aorta with endograft noted. Status post median sternotomy. IMPRESSION: 1. The appearance the chest  suggests mild congestive heart failure, as above. 2. Postoperative and postprocedural changes, as above. Electronically Signed   By: Vinnie Langton M.D.   On: 11/10/2021 10:32     Assessment and Plan:   1. 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, valvular heart disease with moderate MR, mild-moderate AI by echo 08/2021 - BNP, CXR, presentation c/w CHF exacerbation -  patient had new drop in EF by echo 08/2021 to 35-40% compared to normal in 2022 of unclear etiology but no previous CHF symptomatology before now - patient took AM meds then vomited, unclear to what degree anything was absorbed (lisinopril, carvedilol in home regimen) - per preliminary d/w MD, will d/c lisinopril, start losartan '100mg'$  tomrorow with eye towards starting Entresto the following day if Cr OK, resume carvedilol this afternoon, and continue IV Lasix as ordered given robust UOP with first dose - anticipate ischemic workup in some form this admission given symptoms and need to determine etiology of cardiomyopathy -> would consider cath on Tuesday if renal function tolerates diuresis   2. Chest tightness with mildly elevated troponin, HLD - resolved with SL NTG, IV Lasix, suggestive of relationship to HTN/fluid overload - excellent distal pulses throughout, no overt pain or discomfort in her back - hold off full dose heparin given low, flat troponins and potential blood in emesis - continue daily ASA, statin, beta blocker - check lipids in AM and consider statin titration if indicated - as above, further recs on meds/ischemic w/u forthcoming  3. CKD stage 3a - Cr appears at baseline, follow   4. Vomiting with scant blood, acute on chronic anemia - appears to have been anemic in the past as well, no interim values from 2019 until now available currently - further per medicine team  5. Peripheral vascular disease - aortic aneurysm repair 02/2015 with redo sternotomy with debranching of innominate and carotid arteries 09/2017, left carotid-subclavian bypass and TEVAR 09/2017, chronic celiac artery stenosis -  history less suggestive of acute vascular abnormality  - work on BP control with GDMT above  6. Diabetes mellitus - per IM - consider SGLT2i at DC   Risk Assessment/Risk Scores:     HEAR Score (for undifferentiated chest pain): function not available, N/A  New York Heart  Association (NYHA) Functional Class NYHA Class IV on arrival, now II-III   For questions or updates, please contact [facility] Please consult [email or web address] for contact info under    Signed, Charlie Pitter, PA-C  11/10/2021 2:03 PM Personally seen and examined. Agree with APP above with the following comments:  Briefly 81 yo F with a history of Aortic aneurysm (repair in 2016, redo sternotomy 2019, TEVAR in 2019, HTN with DM, HLD with DM, CKD stage 3a with a history of breast cancer (unclear chemotherapy, no radiation), mild to moderate LAD disease ([place] 2016), who was found to have mild to moderate AI and Moderate MR who presents with chest tightness and SOB.  Patient notes that she has been doing well, no chest pain, SOB, palpitations at her April [facility] visit.  She was found to have LV dysfunction.  She is still able to get groceries without issue.  Over past to days had has chest tightness and shortness of breath.  Has nausea and vomiting that was blood tinged.  Improved with nitroglycerin.  Exam notable for systolic and diastolic murmur, no edema, no dullness to percussion, CTAB.  Otherwise as above CT Aorta from 2016 shows TAA, aortic atherosclerosis, and CAC  Labs  notable for potentially new anemia Hgb 9.5,  EKG Sinus Bradycardia with LVH and secondary repolarization, similar to 07/28/16 Tele: Rare PVCs Personally reviewed relevant tests;  Troponin 20-> 24, BNP 2300, Albumin low at 3.4, Creatiine 1.24  Would recommend  - blood pressure control and diuresis: continue IV lasix with strict I&Os, patient notes robust response, Coreg and losartan as above, I have ordered hydralazine 25 mg PO X1 and if elevated BP on this regimen may order for 11/11/21; goal Entresto start for Tuesday if able from creatinine - monitor K and potentially aldactone 12.5 mg PO daily; SGLT2i peri-discharge - potential LHC and RHC when euvolemic; potentially Tuesday depending on response; has chest tightness -  LDL goal of < 70 at least, lipids pending tomorrow - only one episode of blood tinged vomiting; new anemia will need to be followed closely - unable to review outside images; agree with repeat echo We will continue to follow  Rudean Haskell, MD Cardiologist Hypertrophic [place]  [address], [address] [place], [place] [address] [phone]  3:10 PM

## 2021-11-10 NOTE — ED Triage Notes (Signed)
Pt coming from home via [facility] with c/o [facility] that started at 2200 last night. Pt states it feels like something is sitting on her chest so she cant get a good breath. Pt reports trying to use her inhaler but it did not provide any relief. Pt states she took her medicine including her blood pressure meds this morning but vomited them up and noted some blood   BP 200/90 HR 60 96% RA CBG 163

## 2021-11-11 ENCOUNTER — Observation Stay (HOSPITAL_COMMUNITY): Payer: Medicare Other

## 2021-11-11 DIAGNOSIS — Z79899 Other long term (current) drug therapy: Secondary | ICD-10-CM | POA: Diagnosis not present

## 2021-11-11 DIAGNOSIS — N1831 Chronic kidney disease, stage 3a: Secondary | ICD-10-CM | POA: Diagnosis present

## 2021-11-11 DIAGNOSIS — I5043 Acute on chronic combined systolic (congestive) and diastolic (congestive) heart failure: Secondary | ICD-10-CM | POA: Diagnosis present

## 2021-11-11 DIAGNOSIS — R001 Bradycardia, unspecified: Secondary | ICD-10-CM | POA: Diagnosis present

## 2021-11-11 DIAGNOSIS — I509 Heart failure, unspecified: Secondary | ICD-10-CM | POA: Diagnosis present

## 2021-11-11 DIAGNOSIS — I160 Hypertensive urgency: Secondary | ICD-10-CM | POA: Diagnosis present

## 2021-11-11 DIAGNOSIS — I420 Dilated cardiomyopathy: Secondary | ICD-10-CM | POA: Diagnosis present

## 2021-11-11 DIAGNOSIS — Z853 Personal history of malignant neoplasm of breast: Secondary | ICD-10-CM | POA: Diagnosis not present

## 2021-11-11 DIAGNOSIS — R112 Nausea with vomiting, unspecified: Secondary | ICD-10-CM | POA: Diagnosis not present

## 2021-11-11 DIAGNOSIS — R0789 Other chest pain: Secondary | ICD-10-CM

## 2021-11-11 DIAGNOSIS — I43 Cardiomyopathy in diseases classified elsewhere: Secondary | ICD-10-CM | POA: Diagnosis present

## 2021-11-11 DIAGNOSIS — D631 Anemia in chronic kidney disease: Secondary | ICD-10-CM | POA: Diagnosis present

## 2021-11-11 DIAGNOSIS — I5041 Acute combined systolic (congestive) and diastolic (congestive) heart failure: Secondary | ICD-10-CM

## 2021-11-11 DIAGNOSIS — Z87891 Personal history of nicotine dependence: Secondary | ICD-10-CM

## 2021-11-11 DIAGNOSIS — Z8679 Personal history of other diseases of the circulatory system: Secondary | ICD-10-CM | POA: Diagnosis not present

## 2021-11-11 DIAGNOSIS — I130 Hypertensive heart and chronic kidney disease with heart failure and stage 1 through stage 4 chronic kidney disease, or unspecified chronic kidney disease: Secondary | ICD-10-CM | POA: Diagnosis present

## 2021-11-11 DIAGNOSIS — Z8249 Family history of ischemic heart disease and other diseases of the circulatory system: Secondary | ICD-10-CM | POA: Diagnosis not present

## 2021-11-11 DIAGNOSIS — Z7984 Long term (current) use of oral hypoglycemic drugs: Secondary | ICD-10-CM | POA: Diagnosis not present

## 2021-11-11 DIAGNOSIS — E1122 Type 2 diabetes mellitus with diabetic chronic kidney disease: Secondary | ICD-10-CM | POA: Diagnosis present

## 2021-11-11 DIAGNOSIS — E785 Hyperlipidemia, unspecified: Secondary | ICD-10-CM

## 2021-11-11 DIAGNOSIS — Z952 Presence of prosthetic heart valve: Secondary | ICD-10-CM | POA: Diagnosis not present

## 2021-11-11 DIAGNOSIS — Z7982 Long term (current) use of aspirin: Secondary | ICD-10-CM | POA: Diagnosis not present

## 2021-11-11 DIAGNOSIS — E1151 Type 2 diabetes mellitus with diabetic peripheral angiopathy without gangrene: Secondary | ICD-10-CM | POA: Diagnosis present

## 2021-11-11 DIAGNOSIS — E876 Hypokalemia: Secondary | ICD-10-CM | POA: Diagnosis not present

## 2021-11-11 DIAGNOSIS — E7800 Pure hypercholesterolemia, unspecified: Secondary | ICD-10-CM | POA: Diagnosis present

## 2021-11-11 DIAGNOSIS — G4700 Insomnia, unspecified: Secondary | ICD-10-CM | POA: Diagnosis not present

## 2021-11-11 LAB — BASIC METABOLIC PANEL
Anion gap: 10 (ref 5–15)
BUN: 21 mg/dL (ref 8–23)
CO2: 26 mmol/L (ref 22–32)
Calcium: 9.1 mg/dL (ref 8.9–10.3)
Chloride: 104 mmol/L (ref 98–111)
Creatinine, Ser: 1.22 mg/dL — ABNORMAL HIGH (ref 0.44–1.00)
GFR, Estimated: 45 mL/min — ABNORMAL LOW (ref >60)
Glucose, Bld: 84 mg/dL (ref 70–99)
Potassium: 3 mmol/L — ABNORMAL LOW (ref 3.5–5.1)
Sodium: 140 mmol/L (ref 135–145)

## 2021-11-11 LAB — LIPID PANEL
Cholesterol: 147 mg/dL (ref 0–200)
HDL: 50 mg/dL (ref >40)
LDL Cholesterol: 81 mg/dL (ref 0–99)
Total CHOL/HDL Ratio: 2.9 RATIO
Triglycerides: 81 mg/dL (ref <150)
VLDL: 16 mg/dL (ref 0–40)

## 2021-11-11 LAB — GLUCOSE, CAPILLARY
Glucose-Capillary: 88 mg/dL (ref 70–99)
Glucose-Capillary: 172 mg/dL — ABNORMAL HIGH (ref 70–99)

## 2021-11-11 LAB — RETICULOCYTES
Immature Retic Fract: 18.6 % — ABNORMAL HIGH (ref 2.3–15.9)
RBC.: 3.92 MIL/uL (ref 3.87–5.11)
Retic Count, Absolute: 61.5 10*3/uL (ref 19.0–186.0)
Retic Ct Pct: 1.6 % (ref 0.4–3.1)

## 2021-11-11 LAB — MAGNESIUM: Magnesium: 1.8 mg/dL (ref 1.7–2.4)

## 2021-11-11 LAB — ECHOCARDIOGRAM COMPLETE
AR max vel: 2.66 cm2
AV Peak grad: 7.5 mmHg
Ao pk vel: 1.37 m/s
Area-P 1/2: 5.5 cm2
Calc EF: 38.4 %
MV M vel: 5.01 m/s
MV Peak grad: 100.3 mmHg
P 1/2 time: 461 msec
S' Lateral: 4.4 cm
Single Plane A2C EF: 37.7 %
Single Plane A4C EF: 37.9 %
Weight: 2296.31 oz

## 2021-11-11 LAB — IRON AND TIBC
Iron: 35 ug/dL (ref 28–170)
Saturation Ratios: 11 % (ref 10.4–31.8)
TIBC: 330 ug/dL (ref 250–450)
UIBC: 295 ug/dL

## 2021-11-11 LAB — HEMOGLOBIN A1C
Hgb A1c MFr Bld: 5.7 % — ABNORMAL HIGH (ref 4.8–5.6)
Mean Plasma Glucose: 116.89 mg/dL

## 2021-11-11 LAB — FERRITIN: Ferritin: 58 ng/mL (ref 11–307)

## 2021-11-11 MED ORDER — FUROSEMIDE 10 MG/ML IJ SOLN
20.0000 mg | Freq: Every day | INTRAMUSCULAR | Status: DC
  Administered 2021-11-11: 20 mg via INTRAVENOUS
  Filled 2021-11-11: qty 2

## 2021-11-11 MED ORDER — POTASSIUM CHLORIDE 20 MEQ PO PACK
40.0000 meq | PACK | ORAL | Status: DC
  Administered 2021-11-11: 40 meq via ORAL
  Filled 2021-11-11 (×2): qty 2

## 2021-11-11 MED ORDER — MAGNESIUM SULFATE 2 GM/50ML IV SOLN
2.0000 g | Freq: Once | INTRAVENOUS | Status: AC
  Administered 2021-11-11: 2 g via INTRAVENOUS
  Filled 2021-11-11: qty 50

## 2021-11-11 MED ORDER — ROSUVASTATIN CALCIUM 5 MG PO TABS
20.0000 mg | ORAL_TABLET | Freq: Every day | ORAL | Status: DC
  Administered 2021-11-13: 20 mg via ORAL
  Filled 2021-11-11: qty 4

## 2021-11-11 MED ORDER — SODIUM CHLORIDE 0.9% FLUSH
3.0000 mL | Freq: Two times a day (BID) | INTRAVENOUS | Status: DC
  Administered 2021-11-11: 3 mL via INTRAVENOUS

## 2021-11-11 MED ORDER — RAMELTEON 8 MG PO TABS
8.0000 mg | ORAL_TABLET | Freq: Every evening | ORAL | Status: DC | PRN
  Administered 2021-11-11 – 2021-11-13 (×2): 8 mg via ORAL
  Filled 2021-11-11 (×3): qty 1

## 2021-11-11 MED ORDER — POTASSIUM CHLORIDE CRYS ER 20 MEQ PO TBCR
40.0000 meq | EXTENDED_RELEASE_TABLET | Freq: Two times a day (BID) | ORAL | Status: AC
  Administered 2021-11-11 (×2): 40 meq via ORAL
  Filled 2021-11-11 (×2): qty 2

## 2021-11-11 NOTE — Progress Notes (Signed)
Risk and benefit of procedure explained to the patient who display clear understanding and agree to proceed.  Discussed with patient possible procedural risk include bleeding, vascular injury, renal injury, arrythmia, MI, stroke and loss of limb or life.

## 2021-11-11 NOTE — Care Management Obs Status (Signed)
[place] NOTIFICATION   Patient Details  Name: Brittany Greene MRN: [id number] Date of Birth: Nov 15, 1940   Medicare Observation Status Notification Given:  Yes    Zenon Mayo, RN 11/11/2021, 11:05 AM

## 2021-11-11 NOTE — Progress Notes (Addendum)
 Progress Note  Patient Name: Brittany Greene Date of Encounter: 11/11/2021  [facility] Cardiologist: Mahesh A Chandrasekhar, MD   Subjective   SOB much improved today.  Denies any further CP.    Inpatient Medications    Scheduled Meds:  aspirin  81 mg Oral Daily   carvedilol  6.25 mg Oral BID WC   enoxaparin (LOVENOX) injection  40 mg Subcutaneous Q24H   losartan  100 mg Oral Q supper   potassium chloride  40 mEq Oral BID   simvastatin  20 mg Oral Daily   Continuous Infusions:  PRN Meds: acetaminophen, ondansetron (ZOFRAN) IV   Vital Signs    Vitals:   11/10/21 1754 11/10/21 2206 11/11/21 0400 11/11/21 0852  BP: (!) 154/82 (!) 171/82 (!) 173/86 (!) 164/72  Pulse: 63 (!) 58 (!) 57 64  Resp:      Temp:  98.4 F (36.9 C) 97.9 F (36.6 C) 97.8 F (36.6 C)  TempSrc:  Oral Oral Oral  SpO2: 97% 96% 96% 93%  Weight:   65.1 kg     Intake/Output Summary (Last 24 hours) at 11/11/2021 1131 Last data filed at 11/11/2021 0000 Gross per 24 hour  Intake 124.62 ml  Output 2050 ml  Net -1925.38 ml      11/11/2021    4:00 AM 11/10/2021    3:18 PM 01/18/2019    2:19 PM  Last 3 Weights  Weight (lbs) 143 lb 8.3 oz 146 lb 6.2 oz 149 lb 2 oz  Weight (kg) 65.1 kg 66.4 kg 67.643 kg      Telemetry    NSR - Personally Reviewed  ECG    Normal sinus rhythm with LVH and repull abnormality unchanged from 2018- Personally Reviewed  Physical Exam   GEN: No acute distress.   Neck: No JVD Cardiac: RRR, no murmurs, rubs, or gallops.  Respiratory: Clear to auscultation bilaterally. GI: Soft, nontender, non-distended  MS: No edema; No deformity. Neuro:  Nonfocal  Psych: Normal affect   Labs    High Sensitivity Troponin:   Recent Labs  Lab 11/10/21 1021 11/10/21 1205  TROPONINIHS 20* 24*      Chemistry Recent Labs  Lab 11/10/21 1021 11/11/21 0353  NA 139 140  K 3.6 3.0*  CL 107 104  CO2 22 26  GLUCOSE 136* 84  BUN 22 21  CREATININE 1.20* 1.22*  CALCIUM 9.0  9.1  PROT 6.9  --   ALBUMIN 3.4*  --   AST 25  --   ALT 15  --   ALKPHOS 67  --   BILITOT 0.7  --   GFRNONAA 45* 45*  ANIONGAP 10 10     Hematology Recent Labs  Lab 11/10/21 1021 11/11/21 0618  WBC 6.5  --   RBC 3.64* 3.92  HGB 9.5*  --   HCT 32.1*  --   MCV 88.2  --   MCH 26.1  --   MCHC 29.6*  --   RDW 15.4  --   PLT 158  --     BNP Recent Labs  Lab 11/10/21 1021  BNP 2,374.9*     DDimer No results for input(s): "DDIMER" in the last 168 hours.     Radiology    DG Chest Portable 1 View  Result Date: 11/10/2021 CLINICAL DATA:  80-year-old female with history of chest pain. EXAM: PORTABLE CHEST 1 VIEW COMPARISON:  Chest x-ray 11/30/2014. FINDINGS: There is cephalization of the pulmonary vasculature and slight indistinctness of the interstitial   markings suggestive of mild pulmonary edema. No definite pleural effusions. Mild cardiomegaly. Severe tortuosity of the chronically dilated thoracic aorta with endograft noted. Status post median sternotomy. IMPRESSION: 1. The appearance the chest suggests mild congestive heart failure, as above. 2. Postoperative and postprocedural changes, as above. Electronically Signed   By: Daniel  Entrikin M.D.   On: 11/10/2021 10:32    Cardiac Studies   2D echo pending  Patient Profile     80 y.o. 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 Christian.  Assessment & Plan    1. 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  - Continue carvedilol 6.25 mg twice daily - She was given a 2 doses of IV Lasix each 20mg and put out 2 L and is net -1.9 to 5 L. - Serum creatinine bumped slightly from 1.2>1.22 - continue Lasix 20mg IV daily - will make n.p.o. after midnight for right and left heart cath tomorrow to define coronary anatomy given her history of chest pressure as well as assess filling pressures  - Would benefit from addition of spironolactone post-cath which would also help her blood pressure and hypokalemia  2. Chest tightness with mildly elevated troponin, HLD - resolved with SL NTG, IV Lasix, suggestive of relationship to HTN/fluid overload - excellent distal pulses throughout, no overt pain or discomfort in her back - Troponin minimally elevated with flat trend at 20 and 24  - hold off full dose heparin given low, flat troponins and potential blood in emesis - continue aspirin 81 mg daily, carvedilol 6.25 mg twice daily and Crestor  - check lipids in AM and consider statin titration if indicated   3. CKD stage 3a - Cr appears at baseline, follow    4. Vomiting with scant blood, acute on chronic anemia - appears to have been anemic in the past as well, no interim values from 2019 until now available currently - Hemoglobin 9.7 which appears to be around her baseline of 9.9 in 2016 - further per medicine team   5. Peripheral vascular disease - aortic aneurysm repair 02/2015 with redo  sternotomy with debranching of innominate and carotid arteries 09/2017, left carotid-subclavian bypass and TEVAR 09/2017, chronic celiac artery stenosis - history less suggestive of acute vascular abnormality  - work on BP control with GDMT above   6. Diabetes mellitus - per IM - consider SGLT2i at DC  7.  Hypertension -BP borderline controlled at 141/82 164/72 mmHg -Change from ACE inhibitor to losartan 100 mg today with plans   to transition to Entresto after 36 hours of being off ACE inhibitor -Continue carvedilol 6.25 mg twice daily  8.  Hyperlipidemia -LDL goal less than 70 -LDL this admission above goal at 81 -Was changed from simvastatin to Crestor 20 mg this admission -will need repeat FLP and ALT in 6 weeks  9.  Hypokalemia -Potassium 3 this morning -Magnesium 1.8 -give mag sulfate 2 g IV now -Potassium being repleted -Be met in a.m.    I have spent a total of 35 minutes with patient reviewing 2D echo, telemetry, EKGs, labs and examining patient as well as establishing an assessment and plan that was discussed with the patient.  > 50% of time was spent in direct patient care.     For questions or updates, please contact [facility] Please consult [email or web address] for contact info under        Signed, Kaiesha Tonner, MD  11/11/2021, 11:31 AM    

## 2021-11-11 NOTE — H&P (View-Only) (Signed)
Progress Note  Patient Name: Brittany Greene Date of Encounter: 11/11/2021  [facility] Cardiologist: Werner Lean, MD   Subjective   SOB much improved today.  Denies any further CP.    Inpatient Medications    Scheduled Meds:  aspirin  81 mg Oral Daily   carvedilol  6.25 mg Oral BID WC   enoxaparin (LOVENOX) injection  40 mg Subcutaneous Q24H   losartan  100 mg Oral Q supper   potassium chloride  40 mEq Oral BID   simvastatin  20 mg Oral Daily   Continuous Infusions:  PRN Meds: acetaminophen, ondansetron (ZOFRAN) IV   Vital Signs    Vitals:   11/10/21 1754 11/10/21 2206 11/11/21 0400 11/11/21 0852  BP: (!) 154/82 (!) 171/82 (!) 173/86 (!) 164/72  Pulse: 63 (!) 58 (!) 57 64  Resp:      Temp:  98.4 F (36.9 C) 97.9 F (36.6 C) 97.8 F (36.6 C)  TempSrc:  Oral Oral Oral  SpO2: 97% 96% 96% 93%  Weight:   65.1 kg     Intake/Output Summary (Last 24 hours) at 11/11/2021 1131 Last data filed at 11/11/2021 0000 Gross per 24 hour  Intake 124.62 ml  Output 2050 ml  Net -1925.38 ml      11/11/2021    4:00 AM 11/10/2021    3:18 PM 01/18/2019    2:19 PM  Last 3 Weights  Weight (lbs) 143 lb 8.3 oz 146 lb 6.2 oz 149 lb 2 oz  Weight (kg) 65.1 kg 66.4 kg 67.643 kg      Telemetry    NSR - Personally Reviewed  ECG    Normal sinus rhythm with LVH and repull abnormality unchanged from 2018- Personally Reviewed  Physical Exam   GEN: No acute distress.   Neck: No JVD Cardiac: RRR, no murmurs, rubs, or gallops.  Respiratory: Clear to auscultation bilaterally. GI: Soft, nontender, non-distended  MS: No edema; No deformity. Neuro:  Nonfocal  Psych: Normal affect   Labs    High Sensitivity Troponin:   Recent Labs  Lab 11/10/21 1021 11/10/21 1205  TROPONINIHS 20* 24*      Chemistry Recent Labs  Lab 11/10/21 1021 11/11/21 0353  NA 139 140  K 3.6 3.0*  CL 107 104  CO2 22 26  GLUCOSE 136* 84  BUN 22 21  CREATININE 1.20* 1.22*  CALCIUM 9.0  9.1  PROT 6.9  --   ALBUMIN 3.4*  --   AST 25  --   ALT 15  --   ALKPHOS 67  --   BILITOT 0.7  --   GFRNONAA 45* 45*  ANIONGAP 10 10     Hematology Recent Labs  Lab 11/10/21 1021 11/11/21 0618  WBC 6.5  --   RBC 3.64* 3.92  HGB 9.5*  --   HCT 32.1*  --   MCV 88.2  --   MCH 26.1  --   MCHC 29.6*  --   RDW 15.4  --   PLT 158  --     BNP Recent Labs  Lab 11/10/21 1021  BNP 2,374.9*     DDimer No results for input(s): "DDIMER" in the last 168 hours.     Radiology    DG Chest Portable 1 View  Result Date: 11/10/2021 CLINICAL DATA:  81 year old female with history of chest pain. EXAM: PORTABLE CHEST 1 VIEW COMPARISON:  Chest x-ray 11/30/2014. FINDINGS: There is cephalization of the pulmonary vasculature and slight indistinctness of the interstitial  markings suggestive of mild pulmonary edema. No definite pleural effusions. Mild cardiomegaly. Severe tortuosity of the chronically dilated thoracic aorta with endograft noted. Status post median sternotomy. IMPRESSION: 1. The appearance the chest suggests mild congestive heart failure, as above. 2. Postoperative and postprocedural changes, as above. Electronically Signed   By: Vinnie Langton M.D.   On: 11/10/2021 10:32    Cardiac Studies   2D echo pending  Patient Profile     81 y.o. 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 Darrick Meigs.  Assessment & Plan    1. 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, valvular heart disease with moderate MR, mild-moderate  AI by echo 08/2021 - BNP, CXR, presentation c/w CHF exacerbation - patient had new drop in EF by echo 08/2021 to 35-40% compared to normal in 2022 of unclear etiology but no previous CHF symptomatology before now - patient took AM meds then vomited, unclear to what degree anything was absorbed (lisinopril, carvedilol in home regimen) - Lisinopril was stopped and losartan 100 mg was started this morning with eye towards transitioning to [facility] after 36 hours of being off ACE inhibitor if serum creatinine remains stable.  - Continue carvedilol 6.25 mg twice daily - She was given a 2 doses of IV Lasix each 38m and put out 2 L and is net -1.9 to 5 L. - Serum creatinine bumped slightly from 1.2>1.22 - continue Lasix 256mIV daily - will make n.p.o. after midnight for right and left heart cath tomorrow to define coronary anatomy given her history of chest pressure as well as assess filling pressures  - Would benefit from addition of spironolactone post-cath which would also help her blood pressure and hypokalemia  2. Chest tightness with mildly elevated troponin, HLD - resolved with SL NTG, IV Lasix, suggestive of relationship to HTN/fluid overload - excellent distal pulses throughout, no overt pain or discomfort in her back - Troponin minimally elevated with flat trend at 20 and 24  - hold off full dose heparin given low, flat troponins and potential blood in emesis - continue aspirin 81 mg daily, carvedilol 6.25 mg twice daily and Crestor  - check lipids in AM and consider statin titration if indicated   3. CKD stage 3a - Cr appears at baseline, follow    4. Vomiting with scant blood, acute on chronic anemia - appears to have been anemic in the past as well, no interim values from 2019 until now available currently - Hemoglobin 9.7 which appears to be around her baseline of 9.9 in 2016 - further per medicine team   5. Peripheral vascular disease - aortic aneurysm repair 02/2015 with redo  sternotomy with debranching of innominate and carotid arteries 09/2017, left carotid-subclavian bypass and TEVAR 09/2017, chronic celiac artery stenosis - history less suggestive of acute vascular abnormality  - work on BP control with GDMT above   6. Diabetes mellitus - per IM - consider SGLT2i at DC  7.  Hypertension -BP borderline controlled at 141/82 164/72 mmHg -Change from ACE inhibitor to losartan 100 mg today with plans  to transition to [facility] after 36 hours of being off ACE inhibitor -Continue carvedilol 6.25 mg twice daily  8.  Hyperlipidemia -LDL goal less than 70 -LDL this admission above goal at 58 -Was changed from simvastatin to Crestor 20 mg this admission -will need repeat FLP and ALT in 6 weeks  9.  Hypokalemia -Potassium 3 this morning -Magnesium 1.8 -give mag sulfate 2 g IV now -Potassium being repleted -Be met in a.m.    I have spent a total of 35 minutes with patient reviewing 2D echo, telemetry, EKGs, labs and examining patient as well as establishing an assessment and plan that was discussed with the patient.  > 50% of time was spent in direct patient care.     For questions or updates, please contact [place] Please consult [email or web address] for contact info under        Signed, Fransico Him, MD  11/11/2021, 11:31 AM

## 2021-11-11 NOTE — TOC Progression Note (Signed)
Transition of Care [facility]) - Progression Note    Patient Details  Name: Brittany Greene MRN: [id number] Date of Birth: 1941/05/18  [facility] CM/SW Contact  Zenon Mayo, RN Phone Number: 11/11/2021, 2:46 PM  Clinical Narrative:    from home, indep, CHF Ex, sob, conts to diurese IV lasix 20 bid, for echo today, poss cath tomorrow. TOC will continue to follow for dc needs.        Expected Discharge Plan and Services                                                 Social Determinants of Health (SDOH) Interventions    Readmission Risk Interventions     No data to display

## 2021-11-11 NOTE — Progress Notes (Addendum)
HD#0 Subjective:  Overnight Events: NAEON  Patient seen and assessed at bedside.  Patient denies dyspnea and chest pain since yesterday.  Reports doing well otherwise.  Discussed plan to start antihypertensives to better control blood pressure, echo today to assess heart function, LHC/RHC tomorrow.  Patient was amenable to all of these plans.  Objective:  Vital signs in last 24 hours: Vitals:   11/10/21 1615 11/10/21 1754 11/10/21 2206 11/11/21 0400  BP: (!) 178/88 (!) 154/82 (!) 171/82 (!) 173/86  Pulse: 66 63 (!) 58 (!) 57  Resp:      Temp:   98.4 F (36.9 C) 97.9 F (36.6 C)  TempSrc:   Oral Oral  SpO2: 94% 97% 96% 96%  Weight:    65.1 kg   Supplemental O2: Room Air SpO2: 96 %   Physical Exam:  Physical Exam Constitutional:      Appearance: Brittany Greene is well-developed.  HENT:     Head: Normocephalic and atraumatic.  Cardiovascular:     Rate and Rhythm: Normal rate and regular rhythm.     Pulses: Normal pulses.     Heart sounds: Murmur heard.  Pulmonary:     Effort: Pulmonary effort is normal.     Comments: rales in lower left quadrant Chest:     Chest wall: No mass or tenderness.  Abdominal:     General: Bowel sounds are normal.     Palpations: Abdomen is soft. There is no mass.     Tenderness: There is no abdominal tenderness. There is no guarding or rebound.  Skin:    General: Skin is warm and dry.     Capillary Refill: Capillary refill takes less than 2 seconds.  Neurological:     General: No focal deficit present.     Mental Status: Brittany Greene is alert and oriented to person, place, and time.  Psychiatric:        Mood and Affect: Mood normal.        Behavior: Behavior normal.     Filed Weights   11/10/21 1518 11/11/21 0400  Weight: 66.4 kg 65.1 kg     Intake/Output Summary (Last 24 hours) at 11/11/2021 0711 Last data filed at 11/11/2021 0000 Gross per 24 hour  Intake 124.62 ml  Output 2050 ml  Net -1925.38 ml   Net IO Since Admission: -1,925.38 mL  [11/11/21 0711]  Pertinent Labs:    Latest Ref Rng & Units 11/10/2021   10:21 AM 07/25/2016    4:00 PM 11/30/2014   10:30 PM  CBC  WBC 4.0 - 10.5 K/uL 6.5  6.5  10.3   Hemoglobin 12.0 - 15.0 g/dL 9.5  10.6  9.7   Hematocrit 36.0 - 46.0 % 32.1  33.9  29.9   Platelets 150 - 400 K/uL 158  260  253        Latest Ref Rng & Units 11/11/2021    3:53 AM 11/10/2021   10:21 AM 07/25/2016    4:00 PM  CMP  Glucose 70 - 99 mg/dL 84  136  94   BUN 8 - 23 mg/dL '21  22  17   '$ Creatinine 0.44 - 1.00 mg/dL 1.22  1.20  1.08   Sodium 135 - 145 mmol/L 140  139  139   Potassium 3.5 - 5.1 mmol/L 3.0  3.6  3.1   Chloride 98 - 111 mmol/L 104  107  102   CO2 22 - 32 mmol/L '26  22  28   '$ Calcium 8.9 -  10.3 mg/dL 9.1  9.0  9.5   Total Protein 6.5 - 8.1 g/dL  6.9    Total Bilirubin 0.3 - 1.2 mg/dL  0.7    Alkaline Phos 38 - 126 U/L  67    AST 15 - 41 U/L  25    ALT 0 - 44 U/L  15      Imaging: DG Chest Portable 1 View  Result Date: 11/10/2021 CLINICAL DATA:  81 year old female with history of chest pain. EXAM: PORTABLE CHEST 1 VIEW COMPARISON:  Chest x-ray 11/30/2014. FINDINGS: There is cephalization of the pulmonary vasculature and slight indistinctness of the interstitial markings suggestive of mild pulmonary edema. No definite pleural effusions. Mild cardiomegaly. Severe tortuosity of the chronically dilated thoracic aorta with endograft noted. Status post median sternotomy. IMPRESSION: 1. The appearance the chest suggests mild congestive heart failure, as above. 2. Postoperative and postprocedural changes, as above. Electronically Signed   By: Vinnie Langton M.D.   On: 11/10/2021 10:32    Assessment/Plan:   Principal Problem:   CHF exacerbation ([place])   Patient Summary: Brittany Greene is an 81 year old female with a significant cardiovascular history including cardiomyopathy with reduced EF, hypertension, hyperlipidemia, aortic aneurysm repair and AVR (2016) left carotid-subclavian bypass and TEVAR (2019)  who is presenting for sudden onset chest pressure and shortness of breath that has since resolved but is being admitted for suspected acute exacerbation of HFrEF.  Acute on chronic HFrEF exacerbation Hypertension VSS on room air. Net UOP 1.9 L. Weight down 1.3 kg. Echo done in 08/2021 showed LVEF 35% with G3DD, mild aortic regurg, severe left/right atrial dilation.  -Start Cozaar 100 mg qd for blood pressure -Plan to start Entresto possibly tomorrow if Creatinine stable -Continue Coreg 6.25 mg bid with meals -Echo pending -LHC/RHC likely tomorrow, NPO at midnight -Continue IV Lasix 20 qd  Hypokalemia K 3.0, repleted. Mg 1.8, repleted -Replete K as needed -AM BMP  CKD stage 3a Cr 1.2>1.22, stable. Baseline appears to be ~1.1 -AM BMP  HLD LDL cholesterol 81 -Switch from simvastatin 20 to high intensity statin crestor 20 mg qd   Vomiting with scant blood Acute on Chronic anemia -Ferritin, Iron, TIBC, reticulocyte pending  Insomnia -Ramelteon qhs prn  PVD Aortic aneurysm repair, carotid-subclavian bypass and TEVAR 09/2017, chronic celiac artery stenosis -Continue to monitor -BP meds as above  Prediabetes A1c 5.7. Taking glipizide at home. -Consider SGLT2 inhibitor on discharge -D/c SSI as well controlled at this time  Diet: Heart Healthy IVF: None,None VTE: Lovenox Code: Full PT/OT recs: pending TOC recs: pending   Dispo: Anticipated discharge pending PT/OT recs in 1-2 days pending PT/OT eval and cardiac workup.   France Ravens, MD 11/11/2021, 7:11 AM Pager: [phone]  Please contact the on call pager after 5 pm and on weekends at [phone].

## 2021-11-11 NOTE — Hospital Course (Signed)
Brittany Greene is a 81 y.o.female with a history of *** who was admitted to [facility] for ***. Hospital course is detailed below:   Other chronic conditions were medically managed with home medications and formulary alternatives as necessary (***)  PCP Follow-up Recommendations:

## 2021-11-11 NOTE — Progress Notes (Signed)
PT Cancellation Note  Patient Details Name: Janiel Derhammer MRN: [id number] DOB: 25-Dec-1940   Cancelled Treatment:    Reason Eval/Treat Not Completed: Fatigue/lethargy limiting ability to participate at this time after OT session earlier in the day. Will continue to follow and evaluate as able.   West Carbo, PT, DPT   Acute Rehabilitation Department   Sandra Cockayne 11/11/2021, 5:22 PM

## 2021-11-11 NOTE — Progress Notes (Incomplete)
Progress Note  Patient Name: Brittany Greene Date of Encounter: 11/11/2021  [facility] Cardiologist: Werner Lean, MD ***  Subjective   ***  Inpatient Medications    Scheduled Meds:  aspirin  81 mg Oral Daily   carvedilol  6.25 mg Oral BID WC   enoxaparin (LOVENOX) injection  40 mg Subcutaneous Q24H   losartan  100 mg Oral Q supper   potassium chloride  40 mEq Oral Q4H   simvastatin  20 mg Oral Daily   Continuous Infusions:  PRN Meds: acetaminophen, ondansetron (ZOFRAN) IV   Vital Signs    Vitals:   11/10/21 1754 11/10/21 2206 11/11/21 0400 11/11/21 0852  BP: (!) 154/82 (!) 171/82 (!) 173/86 (!) 164/72  Pulse: 63 (!) 58 (!) 57 64  Resp:      Temp:  98.4 F (36.9 C) 97.9 F (36.6 C) 97.8 F (36.6 C)  TempSrc:  Oral Oral Oral  SpO2: 97% 96% 96% 93%  Weight:   65.1 kg     Intake/Output Summary (Last 24 hours) at 11/11/2021 1030 Last data filed at 11/11/2021 0000 Gross per 24 hour  Intake 124.62 ml  Output 2050 ml  Net -1925.38 ml      11/11/2021    4:00 AM 11/10/2021    3:18 PM 01/18/2019    2:19 PM  Last 3 Weights  Weight (lbs) 143 lb 8.3 oz 146 lb 6.2 oz 149 lb 2 oz  Weight (kg) 65.1 kg 66.4 kg 67.643 kg      Telemetry    *** - Personally Reviewed  ECG    *** - Personally Reviewed  Physical Exam  *** GEN: No acute distress.   Neck: No JVD Cardiac: RRR, no murmurs, rubs, or gallops.  Respiratory: Clear to auscultation bilaterally. GI: Soft, nontender, non-distended  MS: No edema; No deformity. Neuro:  Nonfocal  Psych: Normal affect   Labs    High Sensitivity Troponin:   Recent Labs  Lab 11/10/21 1021 11/10/21 1205  TROPONINIHS 20* 24*     Chemistry Recent Labs  Lab 11/10/21 1021 11/11/21 0353 11/11/21 0618  NA 139 140  --   K 3.6 3.0*  --   CL 107 104  --   CO2 22 26  --   GLUCOSE 136* 84  --   BUN 22 21  --   CREATININE 1.20* 1.22*  --   CALCIUM 9.0 9.1  --   MG  --   --  1.8  PROT 6.9  --   --   ALBUMIN  3.4*  --   --   AST 25  --   --   ALT 15  --   --   ALKPHOS 67  --   --   BILITOT 0.7  --   --   GFRNONAA 45* 45*  --   ANIONGAP 10 10  --     Lipids  Recent Labs  Lab 11/11/21 0353  CHOL 147  TRIG 81  HDL 50  LDLCALC 81  CHOLHDL 2.9    Hematology Recent Labs  Lab 11/10/21 1021 11/11/21 0618  WBC 6.5  --   RBC 3.64* 3.92  HGB 9.5*  --   HCT 32.1*  --   MCV 88.2  --   MCH 26.1  --   MCHC 29.6*  --   RDW 15.4  --   PLT 158  --    Thyroid No results for input(s): "TSH", "FREET4" in the last 168 hours.  BNP Recent Labs  Lab 11/10/21 1021  BNP 2,374.9*    DDimer No results for input(s): "DDIMER" in the last 168 hours.   Radiology    DG Chest Portable 1 View  Result Date: 11/10/2021 CLINICAL DATA:  81 year old female with history of chest pain. EXAM: PORTABLE CHEST 1 VIEW COMPARISON:  Chest x-ray 11/30/2014. FINDINGS: There is cephalization of the pulmonary vasculature and slight indistinctness of the interstitial markings suggestive of mild pulmonary edema. No definite pleural effusions. Mild cardiomegaly. Severe tortuosity of the chronically dilated thoracic aorta with endograft noted. Status post median sternotomy. IMPRESSION: 1. The appearance the chest suggests mild congestive heart failure, as above. 2. Postoperative and postprocedural changes, as above. Electronically Signed   By: Vinnie Langton M.D.   On: 11/10/2021 10:32    Cardiac Studies   ***  Patient Profile     81 y.o. 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   Assessment & Plan    *** {Are we signing off today?:[id number]}  For  questions or updates, please contact [place] Please consult [email or web address] for contact info under        Signed, Almyra Deforest, [place]  11/11/2021, 10:30 AM

## 2021-11-11 NOTE — Evaluation (Signed)
Occupational Therapy Evaluation Patient Details Name: Brittany Greene MRN: [id number] DOB: June 12, 1940 Today's Date: 11/11/2021   History of Present Illness 81 year old female admitted with chest pain.  History including cardiomyopathy with reduced EF, hypertension, hyperlipidemia, aortic aneurysm repair and AVR (2016) left carotid-subclavian bypass and TEVAR (2019) who is presenting for sudden onset chest pressure and shortness of breath that occurred last evening around 2100 while lying in bed.  Pt scheduled for cardiac cath 6/20.   Clinical Impression   Pt currently supervision for selfcare tasks sit to stand and for functional mobility without an assistive device.  Feel she will be modified independent with a couple more days of mobility and moving around.  She has all needed DME, no further OT needs at this time.      Recommendations for follow up therapy are one component of a multi-disciplinary discharge planning process, led by the attending physician.  Recommendations may be updated based on patient status, additional functional criteria and insurance authorization.   Follow Up Recommendations  No OT follow up    Assistance Recommended at Discharge PRN     Functional Status Assessment  Patient has not had a recent decline in their functional status  Equipment Recommendations  None recommended by OT    Recommendations for Other Services       Precautions / Restrictions Precautions Precautions: Fall Restrictions Weight Bearing Restrictions: No      Mobility Bed Mobility                    Transfers Overall transfer level: Needs assistance Equipment used: None Transfers: Sit to/from Stand, Bed to chair/wheelchair/BSC Sit to Stand: Supervision     Step pivot transfers: Supervision            Balance Overall balance assessment: Mild deficits observed, not formally tested                                         ADL either performed or  assessed with clinical judgement   ADL Overall ADL's : Needs assistance/impaired Eating/Feeding: Independent   Grooming: Wash/dry hands;Wash/dry face;Supervision/safety;Standing   Upper Body Bathing: Set up;Sitting   Lower Body Bathing: Supervison/ safety;Sit to/from stand   Upper Body Dressing : Set up;Sitting   Lower Body Dressing: Supervision/safety;Sit to/from stand   Toilet Transfer: Sales executive;Ambulation   Toileting- Clothing Manipulation and Hygiene: Supervision/safety;Sit to/from stand   Tub/ Shower Transfer: Supervision/safety   Functional mobility during ADLs: Supervision/safety (no device) General ADL Comments: Pt overall supervision for simulted selfcare tasks secondary to moving slower than normal and reporting slight decreased balance since she hasn't been up a day or two.  Feel like with normal activity she would be independent to modified independent.  Has built in shower seat at home as well.  HR 74 BPM with O2 sats at 94% on room air with mobility.     Vision Baseline Vision/History: 1 Wears glasses (near vision only) Ability to See in Adequate Light: 0 Adequate Patient Visual Report: No change from baseline Vision Assessment?: No apparent visual deficits     Perception     Praxis      Pertinent Vitals/Pain Pain Assessment Pain Assessment: No/denies pain     Hand Dominance Right   Extremity/Trunk Assessment Upper Extremity Assessment Upper Extremity Assessment: Overall WFL for tasks assessed   Lower Extremity Assessment Lower Extremity Assessment: Defer to PT evaluation  Cervical / Trunk Assessment Cervical / Trunk Assessment: Normal   Communication Communication Communication: No difficulties   Cognition Arousal/Alertness: Awake/alert Behavior During Therapy: WFL for tasks assessed/performed Overall Cognitive Status: Within Functional Limits for tasks assessed                                        General Comments  Pt moving slower than baseline with no LOB noted during mobility        Home Living Family/patient expects to be discharged to:: Private residence Living Arrangements: Children (lives with daughter) Available Help at Discharge: Available PRN/intermittently Type of Home: House Home Access: Stairs to enter [employer] of Steps: 1 step up on the [place]: Two level;Able to live on main level with bedroom/bathroom     Bathroom Shower/Tub: Walk-in shower (built in seat)   Biochemist, clinical: Standard Bathroom Accessibility: Yes   Home Equipment: Cane - single point;Rollator (4 wheels)          Prior Functioning/Environment Prior Level of Function : Independent/Modified Independent                                           AM-PAC OT "6 Clicks" Daily Activity     Outcome Measure Help from another person eating meals?: None Help from another person taking care of personal grooming?: A Little Help from another person toileting, which includes using toliet, bedpan, or urinal?: A Little Help from another person bathing (including washing, rinsing, drying)?: A Little Help from another person to put on and taking off regular upper body clothing?: A Little Help from another person to put on and taking off regular lower body clothing?: A Little 6 Click Score: 19   End of Session Equipment Utilized During Treatment: Gait belt Nurse Communication: Mobility status  Activity Tolerance: Patient tolerated treatment well Patient left: in chair;with call bell/phone within reach;with family/visitor present                   Time: 6945-0388 OT Time Calculation (min): 24 min Charges:  OT General Charges $OT Visit: 1 Visit OT Evaluation $OT Eval Low Complexity: 1 Low OT Treatments $Self Care/Home Management : 8-22 mins Mette Southgate OTR/L 11/11/2021, 3:01 PM

## 2021-11-12 ENCOUNTER — Inpatient Hospital Stay (HOSPITAL_COMMUNITY)
Admission: EM | Disposition: A | Discharge status: Home / Self Care | Payer: Self-pay | Source: Home / Self Care | Attending: Internal Medicine

## 2021-11-12 ENCOUNTER — Encounter (HOSPITAL_COMMUNITY): Payer: Self-pay | Admitting: Cardiology

## 2021-11-12 DIAGNOSIS — R0789 Other chest pain: Secondary | ICD-10-CM | POA: Diagnosis not present

## 2021-11-12 DIAGNOSIS — I160 Hypertensive urgency: Secondary | ICD-10-CM | POA: Diagnosis not present

## 2021-11-12 DIAGNOSIS — I420 Dilated cardiomyopathy: Secondary | ICD-10-CM | POA: Diagnosis not present

## 2021-11-12 DIAGNOSIS — I5041 Acute combined systolic (congestive) and diastolic (congestive) heart failure: Secondary | ICD-10-CM | POA: Diagnosis not present

## 2021-11-12 HISTORY — PX: RIGHT/LEFT HEART CATH AND CORONARY ANGIOGRAPHY: CATH118266

## 2021-11-12 LAB — CBC
HCT: 34.3 % — ABNORMAL LOW (ref 36.0–46.0)
Hemoglobin: 10.9 g/dL — ABNORMAL LOW (ref 12.0–15.0)
MCH: 25.6 pg — ABNORMAL LOW (ref 26.0–34.0)
MCHC: 31.8 g/dL (ref 30.0–36.0)
MCV: 80.5 fL (ref 80.0–100.0)
Platelets: 181 10*3/uL (ref 150–400)
RBC: 4.26 MIL/uL (ref 3.87–5.11)
RDW: 15.6 % — ABNORMAL HIGH (ref 11.5–15.5)
WBC: 6.5 10*3/uL (ref 4.0–10.5)
nRBC: 0 % (ref 0.0–0.2)

## 2021-11-12 LAB — BASIC METABOLIC PANEL
Anion gap: 11 (ref 5–15)
BUN: 21 mg/dL (ref 8–23)
CO2: 28 mmol/L (ref 22–32)
Calcium: 9.4 mg/dL (ref 8.9–10.3)
Chloride: 102 mmol/L (ref 98–111)
Creatinine, Ser: 1.12 mg/dL — ABNORMAL HIGH (ref 0.44–1.00)
GFR, Estimated: 49 mL/min — ABNORMAL LOW (ref >60)
Glucose, Bld: 92 mg/dL (ref 70–99)
Potassium: 3.7 mmol/L (ref 3.5–5.1)
Sodium: 141 mmol/L (ref 135–145)

## 2021-11-12 LAB — POCT I-STAT EG7
Acid-Base Excess: 4 mmol/L — ABNORMAL HIGH (ref 0.0–2.0)
Bicarbonate: 29.1 mmol/L — ABNORMAL HIGH (ref 20.0–28.0)
Calcium, Ion: 1.22 mmol/L (ref 1.15–1.40)
HCT: 33 % — ABNORMAL LOW (ref 36.0–46.0)
Hemoglobin: 11.2 g/dL — ABNORMAL LOW (ref 12.0–15.0)
O2 Saturation: 57 %
Potassium: 3.9 mmol/L (ref 3.5–5.1)
Sodium: 144 mmol/L (ref 135–145)
TCO2: 30 mmol/L (ref 22–32)
pCO2, Ven: 45.1 mmHg (ref 44–60)
pH, Ven: 7.417 (ref 7.25–7.43)
pO2, Ven: 30 mmHg — CL (ref 32–45)

## 2021-11-12 LAB — POCT I-STAT 7, (LYTES, BLD GAS, ICA,H+H)
Acid-Base Excess: 2 mmol/L (ref 0.0–2.0)
Acid-Base Excess: 4 mmol/L — ABNORMAL HIGH (ref 0.0–2.0)
Bicarbonate: 26.7 mmol/L (ref 20.0–28.0)
Bicarbonate: 28.9 mmol/L — ABNORMAL HIGH (ref 20.0–28.0)
Calcium, Ion: 1.19 mmol/L (ref 1.15–1.40)
Calcium, Ion: 1.2 mmol/L (ref 1.15–1.40)
HCT: 32 % — ABNORMAL LOW (ref 36.0–46.0)
HCT: 32 % — ABNORMAL LOW (ref 36.0–46.0)
Hemoglobin: 10.9 g/dL — ABNORMAL LOW (ref 12.0–15.0)
Hemoglobin: 10.9 g/dL — ABNORMAL LOW (ref 12.0–15.0)
O2 Saturation: 57 %
O2 Saturation: 93 %
Potassium: 3.8 mmol/L (ref 3.5–5.1)
Potassium: 3.9 mmol/L (ref 3.5–5.1)
Sodium: 144 mmol/L (ref 135–145)
Sodium: 144 mmol/L (ref 135–145)
TCO2: 28 mmol/L (ref 22–32)
TCO2: 30 mmol/L (ref 22–32)
pCO2 arterial: 40.2 mmHg (ref 32–48)
pCO2 arterial: 45.2 mmHg (ref 32–48)
pH, Arterial: 7.413 (ref 7.35–7.45)
pH, Arterial: 7.431 (ref 7.35–7.45)
pO2, Arterial: 30 mmHg — CL (ref 83–108)
pO2, Arterial: 64 mmHg — ABNORMAL LOW (ref 83–108)

## 2021-11-12 LAB — GLUCOSE, CAPILLARY: Glucose-Capillary: 121 mg/dL — ABNORMAL HIGH (ref 70–99)

## 2021-11-12 LAB — POCT ACTIVATED CLOTTING TIME: Activated Clotting Time: 125 seconds

## 2021-11-12 SURGERY — RIGHT/LEFT HEART CATH AND CORONARY ANGIOGRAPHY
Anesthesia: LOCAL

## 2021-11-12 MED ORDER — SODIUM CHLORIDE 0.9% FLUSH: 3.0000 mL | INTRAVENOUS | Status: DC | PRN

## 2021-11-12 MED ORDER — HYDRALAZINE HCL 20 MG/ML IJ SOLN
10.0000 mg | INTRAMUSCULAR | Status: AC | PRN
  Filled 2021-11-12: qty 1

## 2021-11-12 MED ORDER — LABETALOL HCL 5 MG/ML IV SOLN
INTRAVENOUS | Status: AC
  Filled 2021-11-12: qty 4

## 2021-11-12 MED ORDER — ONDANSETRON HCL 4 MG/2ML IJ SOLN: 4.0000 mg | Freq: Four times a day (QID) | INTRAMUSCULAR | Status: DC | PRN

## 2021-11-12 MED ORDER — IOHEXOL 350 MG/ML SOLN
INTRAVENOUS | Status: DC | PRN
  Administered 2021-11-12: 140 mL

## 2021-11-12 MED ORDER — SODIUM CHLORIDE 0.9% FLUSH: 3.0000 mL | Freq: Two times a day (BID) | INTRAVENOUS | Status: DC

## 2021-11-12 MED ORDER — SODIUM CHLORIDE 0.9 % IV SOLN: INTRAVENOUS | Status: DC

## 2021-11-12 MED ORDER — VERAPAMIL HCL 2.5 MG/ML IV SOLN
INTRAVENOUS | Status: AC
  Filled 2021-11-12: qty 2

## 2021-11-12 MED ORDER — SODIUM CHLORIDE 0.9 % IV SOLN: INTRAVENOUS | Status: AC

## 2021-11-12 MED ORDER — LABETALOL HCL 5 MG/ML IV SOLN
10.0000 mg | INTRAVENOUS | Status: AC | PRN
  Administered 2021-11-12: 10 mg via INTRAVENOUS
  Filled 2021-11-12: qty 4

## 2021-11-12 MED ORDER — MIDAZOLAM HCL 2 MG/2ML IJ SOLN
INTRAMUSCULAR | Status: AC
  Filled 2021-11-12: qty 2

## 2021-11-12 MED ORDER — ASPIRIN 81 MG PO CHEW
81.0000 mg | CHEWABLE_TABLET | ORAL | Status: AC
  Administered 2021-11-12: 81 mg via ORAL
  Filled 2021-11-12: qty 1

## 2021-11-12 MED ORDER — HEPARIN SODIUM (PORCINE) 1000 UNIT/ML IJ SOLN
INTRAMUSCULAR | Status: AC
  Filled 2021-11-12: qty 10

## 2021-11-12 MED ORDER — MIDAZOLAM HCL 2 MG/2ML IJ SOLN
INTRAMUSCULAR | Status: DC | PRN
  Administered 2021-11-12: 1 mg via INTRAVENOUS

## 2021-11-12 MED ORDER — LIDOCAINE HCL (PF) 1 % IJ SOLN
INTRAMUSCULAR | Status: DC | PRN
  Administered 2021-11-12: 2 mL
  Administered 2021-11-12: 10 mL

## 2021-11-12 MED ORDER — SODIUM CHLORIDE 0.9 % IV SOLN: 250.0000 mL | INTRAVENOUS | Status: DC | PRN

## 2021-11-12 MED ORDER — METOPROLOL TARTRATE 25 MG PO TABS
25.0000 mg | ORAL_TABLET | Freq: Once | ORAL | Status: AC
  Administered 2021-11-12: 25 mg via ORAL
  Filled 2021-11-12: qty 1

## 2021-11-12 MED ORDER — LIDOCAINE HCL (PF) 1 % IJ SOLN
INTRAMUSCULAR | Status: AC
  Filled 2021-11-12: qty 30

## 2021-11-12 MED ORDER — FUROSEMIDE 20 MG PO TABS
20.0000 mg | ORAL_TABLET | Freq: Every day | ORAL | Status: DC
  Administered 2021-11-13: 20 mg via ORAL
  Filled 2021-11-12: qty 1

## 2021-11-12 MED ORDER — FENTANYL CITRATE (PF) 100 MCG/2ML IJ SOLN
INTRAMUSCULAR | Status: AC
  Filled 2021-11-12: qty 2

## 2021-11-12 MED ORDER — HEPARIN (PORCINE) IN NACL 1000-0.9 UT/500ML-% IV SOLN
INTRAVENOUS | Status: DC | PRN
  Administered 2021-11-12 (×2): 500 mL

## 2021-11-12 MED ORDER — LABETALOL HCL 5 MG/ML IV SOLN
INTRAVENOUS | Status: DC | PRN
  Administered 2021-11-12: 10 mg via INTRAVENOUS

## 2021-11-12 MED ORDER — FENTANYL CITRATE (PF) 100 MCG/2ML IJ SOLN
INTRAMUSCULAR | Status: DC | PRN
  Administered 2021-11-12: 25 ug via INTRAVENOUS

## 2021-11-12 MED ORDER — HEPARIN (PORCINE) IN NACL 1000-0.9 UT/500ML-% IV SOLN
INTRAVENOUS | Status: AC
  Filled 2021-11-12: qty 500

## 2021-11-12 SURGICAL SUPPLY — 22 items
CATH BALLN WEDGE 5F 110CM (CATHETERS) ×1 IMPLANT
CATH INFINITI 5 FR 3DRC (CATHETERS) ×1 IMPLANT
CATH INFINITI 5 FR AL2 (CATHETERS) ×1 IMPLANT
CATH INFINITI 5 FR MPA2 (CATHETERS) ×1 IMPLANT
CATH INFINITI 5FR JL5 (CATHETERS) ×1 IMPLANT
CATH INFINITI 5FR MULTPACK ANG (CATHETERS) ×1 IMPLANT
CATH LAUNCHER 6FR AL3 (CATHETERS) ×1 IMPLANT
CATH VISTA GUIDE 6FR XBLAD4 (CATHETERS) ×1 IMPLANT
GLIDESHEATH SLEND SS 6F .021 (SHEATH) IMPLANT
GUIDEWIRE INQWIRE 1.5J.035X260 (WIRE) IMPLANT
INQWIRE 1.5J .035X260CM (WIRE) ×2
KIT HEART LEFT (KITS) ×2 IMPLANT
PACK CARDIAC CATHETERIZATION (CUSTOM PROCEDURE TRAY) ×2 IMPLANT
SHEATH GLIDE SLENDER 4/5FR (SHEATH) ×1 IMPLANT
SHEATH PINNACLE 5F 10CM (SHEATH) ×1 IMPLANT
SHEATH PINNACLE 6F 10CM (SHEATH) ×1 IMPLANT
SHEATH PINNACLE ST 6F 45CM (SHEATH) ×1 IMPLANT
SHEATH PROBE COVER 6X72 (BAG) ×1 IMPLANT
TRANSDUCER W/STOPCOCK (MISCELLANEOUS) ×2 IMPLANT
TUBING CIL FLEX 10 FLL-RA (TUBING) ×2 IMPLANT
WIRE EMERALD 3MM-J .025X260CM (WIRE) ×1 IMPLANT
WIRE EMERALD 3MM-J .035X150CM (WIRE) ×1 IMPLANT

## 2021-11-12 NOTE — Progress Notes (Signed)
PT Cancellation Note  Patient Details Name: Brittany Greene MRN: [id number] DOB: [birth year]   Cancelled Treatment:    Reason Eval/Treat Not Completed: Other (comment). Pt on bedrest post cath. Will follow up tomorrow.   Shary Decamp [facility] 11/12/2021, 2:11 PM [place] Office [phone]

## 2021-11-12 NOTE — Progress Notes (Signed)
IV team to bedside per consult, spoke with floor RN. Pt had a cath today, R brachial access and a L mastectomy 20 years ago per pt. Unable to use R arm for 24hrs per cath, and patient uncomfortable with sticking L arm w/ hx mastectomy. Floor RN to follow up with MD about PO BP meds for now, as well as ability to stick L arm given mastectomy having been so long ago. Patient, RN, and IV team agreeable to this plan. Floor RN to replace consult if access needed tonight.

## 2021-11-12 NOTE — Progress Notes (Signed)
PT Cancellation Note  Patient Details Name: Brittany Greene MRN: [id number] DOB: 1941-05-12   Cancelled Treatment:    Reason Eval/Treat Not Completed: Patient at procedure or test/unavailable. Pt at cardiac cath.    [place] 11/12/2021, [place] Office [phone]

## 2021-11-12 NOTE — Progress Notes (Signed)
Pt BP 173/89. Metoprolol 25 po 1X ordered and given due to no IV access.

## 2021-11-12 NOTE — Interval H&P Note (Signed)
History and Physical Interval Note:  11/12/2021 9:35 AM  Brittany Greene  has presented today for surgery, with the diagnosis of chf, chest pain.  The various methods of treatment have been discussed with the patient and family. After consideration of risks, benefits and other options for treatment, the patient has consented to  Procedure(s): RIGHT/LEFT HEART CATH AND CORONARY ANGIOGRAPHY (N/A)  PERCUTANEOUS CORONARY INTERVENTION  as a surgical intervention.  The patient's history has been reviewed, patient examined, no change in status, stable for surgery.  I have reviewed the patient's chart and labs.  Questions were answered to the patient's satisfaction.    Cath Lab Visit (complete for each Cath Lab visit)  Clinical Evaluation Leading to the Procedure:   ACS: No.  Non-ACS:    Anginal Classification: CCS III  Anti-ischemic medical therapy: Minimal Therapy (1 class of medications)  Non-Invasive Test Results: Equivocal test results  Prior CABG: No previous CABG   Brittany Greene

## 2021-11-12 NOTE — Progress Notes (Signed)
88f sheath aspirated and removed from right femoral artery. Manual pressure applied for 20 minutes. Site level 0 no s+s of hematoma. Tegaderm dressing applied, bedrest intructions given.   Bilateral dp and pt pulses palpable, right pt pulse weak.    Bedrest begins at 12:00:00 midday.

## 2021-11-12 NOTE — Progress Notes (Signed)
HD#1 Subjective:  Overnight Events: NAEON  Patient seen and assessed at bedside.  Continues to do well and denies any further episodes of dyspnea or chest pain.  Patient continues to be amenable for LHC/RHC.  Objective:  Vital signs in last 24 hours: Vitals:   11/11/21 0400 11/11/21 0852 11/11/21 1133 11/11/21 1955  BP: (!) 173/86 (!) 164/72 (!) 141/80 (!) 155/88  Pulse: (!) 57 64 63 67  Resp:   16 16  Temp: 97.9 F (36.6 C) 97.8 F (36.6 C)  97.7 F (36.5 C)  TempSrc: Oral Oral  Oral  SpO2: 96% 93% 92% 99%  Weight: 65.1 kg      Supplemental O2: Room Air SpO2: 99 %   Physical Exam:  Physical Exam Constitutional:      Appearance: She is well-developed.  HENT:     Head: Normocephalic and atraumatic.  Cardiovascular:     Rate and Rhythm: Normal rate and regular rhythm.     Pulses: Normal pulses.     Heart sounds: Murmur heard.  Pulmonary:     Effort: Pulmonary effort is normal.     Comments: rales in lower left quadrant Chest:     Chest wall: No mass or tenderness.  Abdominal:     General: Bowel sounds are normal.     Palpations: Abdomen is soft. There is no mass.     Tenderness: There is no abdominal tenderness. There is no guarding or rebound.  Skin:    General: Skin is warm and dry.     Capillary Refill: Capillary refill takes less than 2 seconds.  Neurological:     General: No focal deficit present.     Mental Status: She is alert and oriented to person, place, and time.  Psychiatric:        Mood and Affect: Mood normal.        Behavior: Behavior normal.     Filed Weights   11/10/21 1518 11/11/21 0400  Weight: 66.4 kg 65.1 kg     Intake/Output Summary (Last 24 hours) at 11/12/2021 1610 Last data filed at 11/11/2021 2324 Gross per 24 hour  Intake 1080 ml  Output 1275 ml  Net -195 ml    Net IO Since Admission: -2,120.38 mL [11/12/21 0633]  Pertinent Labs:    Latest Ref Rng & Units 11/12/2021    3:50 AM 11/10/2021   10:21 AM 07/25/2016     4:00 PM  CBC  WBC 4.0 - 10.5 K/uL 6.5  6.5  6.5   Hemoglobin 12.0 - 15.0 g/dL 10.9  9.5  10.6   Hematocrit 36.0 - 46.0 % 34.3  32.1  33.9   Platelets 150 - 400 K/uL 181  158  260        Latest Ref Rng & Units 11/12/2021    3:50 AM 11/11/2021    3:53 AM 11/10/2021   10:21 AM  CMP  Glucose 70 - 99 mg/dL 92  84  136   BUN 8 - 23 mg/dL '21  21  22   '$ Creatinine 0.44 - 1.00 mg/dL 1.12  1.22  1.20   Sodium 135 - 145 mmol/L 141  140  139   Potassium 3.5 - 5.1 mmol/L 3.7  3.0  3.6   Chloride 98 - 111 mmol/L 102  104  107   CO2 22 - 32 mmol/L '28  26  22   '$ Calcium 8.9 - 10.3 mg/dL 9.4  9.1  9.0   Total Protein 6.5 - 8.1  g/dL   6.9   Total Bilirubin 0.3 - 1.2 mg/dL   0.7   Alkaline Phos 38 - 126 U/L   67   AST 15 - 41 U/L   25   ALT 0 - 44 U/L   15     Imaging: ECHOCARDIOGRAM COMPLETE  Result Date: 11/11/2021    ECHOCARDIOGRAM REPORT   Patient Name:   Brittany Greene Date of Exam: 11/11/2021 Medical Rec #:  [id number]    Height:       64.0 in Accession #:    [id number]   Weight:       143.5 lb Date of Birth:  [birth year]    BSA:          1.699 m Patient Age:    81 years     BP:           173/86 mmHg Patient Gender: F            HR:           63 bpm. Exam Location:  Inpatient Procedure: 2D Echo, Cardiac Doppler and Color Doppler Indications:    CHF  History:        Patient has no prior history of Echocardiogram examinations.                 Risk Factors:Diabetes and Hypertension.  Sonographer:    Jyl Heinz Referring Phys: [place]  1. Left ventricular ejection fraction, by estimation, is 30 to 35%. The left ventricle has moderately decreased function. The left ventricle demonstrates global hypokinesis. The left ventricular internal cavity size was mildly dilated. There is mild concentric left ventricular hypertrophy. Left ventricular diastolic parameters are consistent with Grade II diastolic dysfunction (pseudonormalization).  2. Right ventricular systolic function is normal.  The right ventricular size is normal.  3. Left atrial size was severely dilated.  4. Right atrial size was severely dilated.  5. The mitral valve is normal in structure. Moderate mitral valve regurgitation. No evidence of mitral stenosis.  6. The aortic valve is tricuspid. There is mild calcification of the aortic valve. Aortic valve regurgitation is moderate. Aortic valve sclerosis/calcification is present, without any evidence of aortic stenosis.  7. The inferior vena cava is normal in size with greater than 50% respiratory variability, suggesting right atrial pressure of 3 mmHg. FINDINGS  Left Ventricle: Left ventricular ejection fraction, by estimation, is 30 to 35%. The left ventricle has moderately decreased function. The left ventricle demonstrates global hypokinesis. The left ventricular internal cavity size was mildly dilated. There is mild concentric left ventricular hypertrophy. Left ventricular diastolic parameters are consistent with Grade II diastolic dysfunction (pseudonormalization). Right Ventricle: The right ventricular size is normal. No increase in right ventricular wall thickness. Right ventricular systolic function is normal. Left Atrium: Left atrial size was severely dilated. Right Atrium: Right atrial size was severely dilated. Pericardium: There is no evidence of pericardial effusion. Mitral Valve: The mitral valve is normal in structure. Moderate mitral valve regurgitation. No evidence of mitral valve stenosis. Tricuspid Valve: The tricuspid valve is normal in structure. Tricuspid valve regurgitation is trivial. No evidence of tricuspid stenosis. Aortic Valve: The aortic valve is tricuspid. There is mild calcification of the aortic valve. Aortic valve regurgitation is moderate. Aortic regurgitation PHT measures 461 msec. Aortic valve sclerosis/calcification is present, without any evidence of aortic stenosis. Aortic valve peak gradient measures 7.5 mmHg. Pulmonic Valve: The pulmonic valve  was normal in structure. Pulmonic valve regurgitation is not visualized.  No evidence of pulmonic stenosis. Aorta: The aortic root is normal in size and structure. Venous: The inferior vena cava is normal in size with greater than 50% respiratory variability, suggesting right atrial pressure of 3 mmHg. IAS/Shunts: There is right bowing of the interatrial septum, suggestive of elevated left atrial pressure. No atrial level shunt detected by color flow Doppler.  LEFT VENTRICLE PLAX 2D LVIDd:         5.30 cm      Diastology LVIDs:         4.40 cm      LV e' medial:    3.94 cm/s LV PW:         1.20 cm      LV E/e' medial:  22.9 LV IVS:        1.20 cm      LV e' lateral:   4.46 cm/s LVOT diam:     2.20 cm      LV E/e' lateral: 20.2 LV SV:         59 LV SV Index:   34 LVOT Area:     3.80 cm                              3D Volume EF: LV Volumes (MOD)            3D EF:        46 % LV vol d, MOD A2C: 151.0 ml LV EDV:       169 ml LV vol d, MOD A4C: 106.0 ml LV ESV:       91 ml LV vol s, MOD A2C: 94.0 ml  LV SV:        79 ml LV vol s, MOD A4C: 65.8 ml LV SV MOD A2C:     57.0 ml LV SV MOD A4C:     106.0 ml LV SV MOD BP:      50.7 ml RIGHT VENTRICLE            IVC RV Basal diam:  3.60 cm    IVC diam: 1.60 cm RV Mid diam:    3.40 cm RV S prime:     8.76 cm/s TAPSE (M-mode): 1.7 cm LEFT ATRIUM              Index        RIGHT ATRIUM           Index LA diam:        4.80 cm  2.83 cm/m   RA Area:     23.40 cm LA Vol (A2C):   83.6 ml  49.20 ml/m  RA Volume:   68.80 ml  40.49 ml/m LA Vol (A4C):   106.0 ml 62.39 ml/m LA Biplane Vol: 94.8 ml  55.80 ml/m  AORTIC VALVE AV Area (Vmax): 2.66 cm AV Vmax:        137.00 cm/s AV Peak Grad:   7.5 mmHg LVOT Vmax:      96.00 cm/s LVOT Vmean:     57.300 cm/s LVOT VTI:       0.154 m AI PHT:         461 msec  AORTA Ao Root diam: 4.00 cm Ao Asc diam:  3.40 cm MITRAL VALVE               TRICUSPID VALVE MV Area (PHT): 5.50 cm    TR Peak grad:   17.6 mmHg MV Decel  Time: 138 msec    TR Vmax:         210.00 cm/s MR Peak grad: 100.3 mmHg MR Mean grad: 76.0 mmHg    SHUNTS MR Vmax:      500.67 cm/s  Systemic VTI:  0.15 m MR Vmean:     423.0 cm/s   Systemic Diam: 2.20 cm MV E velocity: 90.30 cm/s MV A velocity: 52.00 cm/s MV E/A ratio:  1.74 Glori Bickers MD Electronically signed by Glori Bickers MD Signature Date/Time: 11/11/2021/12:04:29 PM    Final     Assessment/Plan:   Principal Problem:   CHF exacerbation ([facility]) Active Problems:   Other chest pain   Hypertensive urgency   Acute combined systolic and diastolic heart failure (HCC)   Hyperlipidemia LDL goal <70   DCM (dilated cardiomyopathy) ([place])   Patient Summary: Loryn Haacke is an 81 year old female with a significant cardiovascular history including cardiomyopathy with reduced EF, hypertension, hyperlipidemia, aortic aneurysm repair and AVR (2016) left carotid-subclavian bypass and TEVAR (2019) who is presenting for sudden onset chest pressure and shortness of breath that has since resolved but is being admitted for suspected acute exacerbation of HFrEF.  Acute on chronic HFrEF exacerbation Hypertension VSS on room air. Plan for LHC/RHC. Cr 1.22>1.12. BP 155/88. Echo unchanged -Cardiology following, appreciate recs -Cozaar 100 mg qd for blood pressure -Defer initiation of entresto to cardiology -Continue Coreg 6.25 mg bid with meals -LHC/RHC today, NPO since midnight -Holding IV Lasix 20 qd for today. Will resume if needed tomorrow  Hypokalemia Hypomagnesemia K 3.7, repleted. Mg 1.8. -Replete K as needed -AM BMP  CKD stage 3a Cr 1.22>1.12, stable. Baseline appears to be ~1.1 -AM BMP  HLD LDL cholesterol 81 -Continue crestor 20 mg qd   Vomiting with scant blood Acute on Chronic anemia Saturation ratio 11%, iron 35, ferritin 58 -AM CBC  Insomnia -Ramelteon qhs prn  PVD Aortic aneurysm repair, carotid-subclavian bypass and TEVAR 09/2017, chronic celiac artery stenosis -Continue to monitor -BP meds as  above  Prediabetes A1c 5.7. Taking glipizide at home. -Consider SGLT2 inhibitor on discharge  Diet: Heart Healthy IVF: None,None VTE: lovenox Code: Full PT/OT recs: no follow up TOC recs: pending   Dispo: Anticipated discharge pending PT/OT recs in 1-2 days pending PT/OT eval and cardiac workup.   France Ravens, MD 11/12/2021, 6:33 AM Pager: [phone]  Please contact the on call pager after 5 pm and on weekends at [phone].

## 2021-11-13 ENCOUNTER — Telehealth: Payer: Self-pay | Admitting: Internal Medicine

## 2021-11-13 ENCOUNTER — Other Ambulatory Visit (HOSPITAL_COMMUNITY): Payer: Self-pay

## 2021-11-13 DIAGNOSIS — E785 Hyperlipidemia, unspecified: Secondary | ICD-10-CM | POA: Diagnosis not present

## 2021-11-13 DIAGNOSIS — R0789 Other chest pain: Secondary | ICD-10-CM | POA: Diagnosis not present

## 2021-11-13 DIAGNOSIS — I5041 Acute combined systolic (congestive) and diastolic (congestive) heart failure: Secondary | ICD-10-CM | POA: Diagnosis not present

## 2021-11-13 DIAGNOSIS — I420 Dilated cardiomyopathy: Secondary | ICD-10-CM | POA: Diagnosis not present

## 2021-11-13 DIAGNOSIS — I160 Hypertensive urgency: Secondary | ICD-10-CM | POA: Diagnosis not present

## 2021-11-13 LAB — CBC
HCT: 33 % — ABNORMAL LOW (ref 36.0–46.0)
Hemoglobin: 10.6 g/dL — ABNORMAL LOW (ref 12.0–15.0)
MCH: 25.9 pg — ABNORMAL LOW (ref 26.0–34.0)
MCHC: 32.1 g/dL (ref 30.0–36.0)
MCV: 80.7 fL (ref 80.0–100.0)
Platelets: 178 10*3/uL (ref 150–400)
RBC: 4.09 MIL/uL (ref 3.87–5.11)
RDW: 15.7 % — ABNORMAL HIGH (ref 11.5–15.5)
WBC: 7.3 10*3/uL (ref 4.0–10.5)
nRBC: 0.3 % — ABNORMAL HIGH (ref 0.0–0.2)

## 2021-11-13 LAB — BASIC METABOLIC PANEL
Anion gap: 13 (ref 5–15)
BUN: 19 mg/dL (ref 8–23)
CO2: 23 mmol/L (ref 22–32)
Calcium: 9.3 mg/dL (ref 8.9–10.3)
Chloride: 104 mmol/L (ref 98–111)
Creatinine, Ser: 1.05 mg/dL — ABNORMAL HIGH (ref 0.44–1.00)
GFR, Estimated: 53 mL/min — ABNORMAL LOW (ref >60)
Glucose, Bld: 109 mg/dL — ABNORMAL HIGH (ref 70–99)
Potassium: 3.9 mmol/L (ref 3.5–5.1)
Sodium: 140 mmol/L (ref 135–145)

## 2021-11-13 MED ORDER — CARVEDILOL 12.5 MG PO TABS: 12.5000 mg | ORAL_TABLET | Freq: Two times a day (BID) | ORAL | Status: DC

## 2021-11-13 MED ORDER — ROSUVASTATIN CALCIUM 20 MG PO TABS
20.0000 mg | ORAL_TABLET | Freq: Every day | ORAL | 0 refills | Status: DC
  Filled 2021-11-13: qty 30, 30d supply, fill #0

## 2021-11-13 MED ORDER — LOSARTAN POTASSIUM 100 MG PO TABS
100.0000 mg | ORAL_TABLET | Freq: Every day | ORAL | 0 refills | Status: DC
  Filled 2021-11-13: qty 30, 30d supply, fill #0

## 2021-11-13 MED ORDER — CARVEDILOL 12.5 MG PO TABS
12.5000 mg | ORAL_TABLET | Freq: Two times a day (BID) | ORAL | 0 refills | Status: DC
  Filled 2021-11-13: qty 60, 30d supply, fill #0

## 2021-11-13 MED ORDER — FUROSEMIDE 20 MG PO TABS
20.0000 mg | ORAL_TABLET | Freq: Every day | ORAL | 0 refills | Status: DC
  Filled 2021-11-13: qty 30, 30d supply, fill #0

## 2021-11-13 MED ORDER — EMPAGLIFLOZIN 10 MG PO TABS
10.0000 mg | ORAL_TABLET | Freq: Every day | ORAL | 0 refills | Status: DC
  Filled 2021-11-13: qty 30, 30d supply, fill #0

## 2021-11-13 MED ORDER — SPIRONOLACTONE 25 MG PO TABS
12.5000 mg | ORAL_TABLET | Freq: Every day | ORAL | 0 refills | Status: DC
  Filled 2021-11-13: qty 15, 30d supply, fill #0

## 2021-11-13 MED ORDER — SPIRONOLACTONE 12.5 MG HALF TABLET
12.5000 mg | ORAL_TABLET | Freq: Every day | ORAL | Status: DC
  Administered 2021-11-13: 12.5 mg via ORAL
  Filled 2021-11-13: qty 1

## 2021-11-13 MED FILL — Verapamil HCl IV Soln 2.5 MG/ML: INTRAVENOUS | Qty: 2 | Status: AC

## 2021-11-13 NOTE — Progress Notes (Signed)
Progress Note  Patient Name: Brittany Greene Date of Encounter: 11/13/2021  [facility] Cardiologist: Werner Lean, MD   Subjective   Feeling well. No chest pain, sob or palpitations.    Inpatient Medications    Scheduled Meds:  aspirin  81 mg Oral Daily   carvedilol  6.25 mg Oral BID WC   enoxaparin (LOVENOX) injection  40 mg Subcutaneous Q24H   furosemide  20 mg Oral Daily   losartan  100 mg Oral Q supper   rosuvastatin  20 mg Oral Daily   sodium chloride flush  3 mL Intravenous Q12H   sodium chloride flush  3 mL Intravenous Q12H   spironolactone  12.5 mg Oral Daily   Continuous Infusions:  sodium chloride     PRN Meds: sodium chloride, acetaminophen, ondansetron (ZOFRAN) IV, ramelteon, sodium chloride flush   Vital Signs    Vitals:   11/12/21 1822 11/12/21 1956 11/13/21 0100 11/13/21 0438  BP: (!) 177/101 (!) 173/82 (!) 152/71 (!) 159/76  Pulse:  71 62   Resp:  '18 18 18  '$ Temp:  98.5 F (36.9 C) 98.3 F (36.8 C)   TempSrc:  Oral Oral Oral  SpO2:  99% 99% 99%  Weight:    65.9 kg  Height:        Intake/Output Summary (Last 24 hours) at 11/13/2021 1057 Last data filed at 11/13/2021 0447 Gross per 24 hour  Intake 955 ml  Output 1300 ml  Net -345 ml      11/13/2021    4:38 AM 11/12/2021    6:40 AM 11/11/2021    4:00 AM  Last 3 Weights  Weight (lbs) 145 lb 4.8 oz 141 lb 14.4 oz 143 lb 8.3 oz  Weight (kg) 65.908 kg 64.365 kg 65.1 kg      Telemetry   Not on tele currently   ECG    N/a  Physical Exam   GEN: No acute distress.   Neck: No JVD Cardiac: RRR, no murmurs, rubs, or gallops.  Respiratory: Clear to auscultation bilaterally. GI: Soft, nontender, non-distended  MS: No edema; No deformity. Neuro:  Nonfocal  Psych: Normal affect   Labs    High Sensitivity Troponin:   Recent Labs  Lab 11/10/21 1021 11/10/21 1205  TROPONINIHS 20* 24*     Chemistry Recent Labs  Lab 11/10/21 1021 11/11/21 0353 11/11/21 0618  11/12/21 0350 11/12/21 0953 11/12/21 0959 11/12/21 1001 11/13/21 0328  NA 139 140  --  141   < > 144 144 140  K 3.6 3.0*  --  3.7   < > 3.9 3.9 3.9  CL 107 104  --  102  --   --   --  104  CO2 22 26  --  28  --   --   --  23  GLUCOSE 136* 84  --  92  --   --   --  109*  BUN 22 21  --  21  --   --   --  19  CREATININE 1.20* 1.22*  --  1.12*  --   --   --  1.05*  CALCIUM 9.0 9.1  --  9.4  --   --   --  9.3  MG  --   --  1.8  --   --   --   --   --   PROT 6.9  --   --   --   --   --   --   --  ALBUMIN 3.4*  --   --   --   --   --   --   --   AST 25  --   --   --   --   --   --   --   ALT 15  --   --   --   --   --   --   --   ALKPHOS 67  --   --   --   --   --   --   --   BILITOT 0.7  --   --   --   --   --   --   --   GFRNONAA 45* 45*  --  49*  --   --   --  53*  ANIONGAP 10 10  --  11  --   --   --  13   < > = values in this interval not displayed.    Lipids  Recent Labs  Lab 11/11/21 0353  CHOL 147  TRIG 81  HDL 50  LDLCALC 81  CHOLHDL 2.9    Hematology Recent Labs  Lab 11/10/21 1021 11/11/21 0618 11/12/21 0350 11/12/21 0953 11/12/21 0959 11/12/21 1001 11/13/21 0328  WBC 6.5  --  6.5  --   --   --  7.3  RBC 3.64* 3.92 4.26  --   --   --  4.09  HGB 9.5*  --  10.9*   < > 10.9* 11.2* 10.6*  HCT 32.1*  --  34.3*   < > 32.0* 33.0* 33.0*  MCV 88.2  --  80.5  --   --   --  80.7  MCH 26.1  --  25.6*  --   --   --  25.9*  MCHC 29.6*  --  31.8  --   --   --  32.1  RDW 15.4  --  15.6*  --   --   --  15.7*  PLT 158  --  181  --   --   --  178   < > = values in this interval not displayed.    BNP Recent Labs  Lab 11/10/21 1021  BNP 2,374.9*     Radiology    CARDIAC CATHETERIZATION  Result Date: 11/12/2021   Ost RCA to Prox RCA lesion is 20% stenosed.   There is no aortic valve stenosis. Nonischemic Cardiomyopathy Angiographically tortuous but relatively normal coronary arteries with no significant disease. Very tortuous and dilated aorta from abdominal aorta  through the stented thoracic aorta.  Patent right subclavian anastomosis Essentially normal RHC pressures: RAP 8 mmHg, RVP-EDP 29/0-11 mmHg; PAP-mean 29/13-18 mmHg, PCWP 14 mmHg, LVP-EDP 158/9-16 mmHg, AOP-MAP 156/75-105 mmHg.;  Ao sat 93%, PA sat 57%; Cardiac Output-Index (Fick) reduced: 4.23 - 2.49 RECOMMENDATIONS Antihypertensive management; GDMT for NICM-compensated CHF Gentle post-cath hydration but monitor for volume overload. [name], MD   Cardiac Studies   Echo 11/11/2021 1. Left ventricular ejection fraction, by estimation, is 30 to 35%. The  left ventricle has moderately decreased function. The left ventricle  demonstrates global hypokinesis. The left ventricular internal cavity size  was mildly dilated. There is mild  concentric left ventricular hypertrophy. Left ventricular diastolic  parameters are consistent with Grade II diastolic dysfunction  (pseudonormalization).   2. Right ventricular systolic function is normal. The right ventricular  size is normal.   3. Left atrial size was severely dilated.   4. Right atrial size was  severely dilated.   5. The mitral valve is normal in structure. Moderate mitral valve  regurgitation. No evidence of mitral stenosis.   6. The aortic valve is tricuspid. There is mild calcification of the  aortic valve. Aortic valve regurgitation is moderate. Aortic valve  sclerosis/calcification is present, without any evidence of aortic  stenosis.   7. The inferior vena cava is normal in size with greater than 50%  respiratory variability, suggesting right atrial pressure of 3 mmHg.   RIGHT/LEFT HEART CATH AND CORONARY ANGIOGRAPHY 11/12/2021   Conclusion      Ost RCA to Prox RCA lesion is 20% stenosed.   There is no aortic valve stenosis.   Nonischemic Cardiomyopathy Angiographically tortuous but relatively normal coronary arteries with no significant disease. Very tortuous and dilated aorta from abdominal aorta through the stented thoracic  aorta.  Patent right subclavian anastomosis   Essentially normal RHC pressures:  RAP 8 mmHg, RVP-EDP 29/0-11 mmHg;  PAP-mean 29/13-18 mmHg, PCWP 14 mmHg,  LVP-EDP 158/9-16 mmHg, AOP-MAP 156/75-105 mmHg.;   Ao sat 93%, PA sat 57%; Cardiac Output-Index (Fick) reduced: 4.23 - 2.49     RECOMMENDATIONS Antihypertensive management; GDMT for NICM-compensated CHF Gentle post-cath hydration but monitor for volume overload.    Patient Profile     81 y.o. 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  Assessment & Plan    1. 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, valvular heart disease with moderate MR, mild-moderate AI by echo 08/2021 - patient had new drop in EF by echo 08/2021 to 35-40% (done at [facility]) compared to normal in 2022 of unclear etiology but no previous CHF symptomatology before now -BNP, CXR, presentation c/w CHF exacerbation -Treated with IV lasix and now transitioned to PO. Net I & O -2.4L. Weight 146>>145lb.  - RHC with normal pressures as above. No CAD by cath. Likely hypertensive cardiomyopathy - On BB, losartan (started 6/16), Arlyce Harman and lasix (needs med titration>will review with MD) - Consider SGLT2I  2. Chest tightness with mildly elevated troponin, HLD - resolved with SL NTG, IV Lasix, suggestive of relationship to HTN/fluid overload - Cath showing normal coronaries    3. Peripheral vascular disease - aortic aneurysm repair 02/2015 with redo sternotomy with debranching of innominate and carotid arteries 09/2017,  left carotid-subclavian bypass and TEVAR 09/2017, chronic celiac artery stenosis - history less suggestive of acute vascular abnormality  - work on BP control with GDMT above   4.  Hypertension -BP elevated, given hx of aortic aneurysm repair, needs strict control     For questions or updates, please contact [place] Please consult [email or web address] for contact info under        SignedLeanor Kail, PA  11/13/2021, 10:57 AM

## 2021-11-13 NOTE — Discharge Summary (Cosign Needed)
Name: Brittany Greene MRN: [id number] DOB: 04-15-1941 81 y.o. PCP: Everardo Beals, NP  Date of Admission: 11/10/2021  9:04 AM Date of Discharge: 11/13/2021  2:03 PM Attending Physician: No att. providers found  Discharge Diagnosis: 1.  Acute on chronic HFrEF exacerbation 2.  Hypertension 3.  Hypokalemia 4.  Hypomagnesemia 5.  CKD stage IIIa 6.  Hyperlipidemia 7.  Acute on chronic anemia 8.  Insomnia 9.  PVD 10. Prediabetes  Discharge Medications: Allergies as of 11/13/2021   No Known Allergies      Medication List     STOP taking these medications    celecoxib 200 MG capsule Commonly known as: CELEBREX   glipiZIDE XL 10 MG 24 hr tablet Generic drug: glipiZIDE   lisinopril 40 MG tablet Commonly known as: ZESTRIL   metoprolol tartrate 25 MG tablet Commonly known as: LOPRESSOR   simvastatin 20 MG tablet Commonly known as: ZOCOR       TAKE these medications    aspirin 81 MG chewable tablet Chew 81 mg by mouth daily.   carvedilol 12.5 MG tablet Commonly known as: COREG Take 1 tablet (12.5 mg total) by mouth 2 (two) times daily with a meal. What changed:  medication strength how much to take   CENTRUM SILVER 50+WOMEN PO Take 1 tablet by mouth daily.   fluticasone 50 MCG/ACT nasal spray Commonly known as: FLONASE Place 2 sprays into both nostrils daily as needed for allergies or rhinitis.   furosemide 20 MG tablet Commonly known as: LASIX Take 1 tablet (20 mg total) by mouth daily.   Jardiance 10 MG Tabs tablet Generic drug: empagliflozin Take 1 tablet (10 mg total) by mouth daily before breakfast.   losartan 100 MG tablet Commonly known as: COZAAR Take 1 tablet (100 mg total) by mouth daily with supper.   rosuvastatin 20 MG tablet Commonly known as: CRESTOR Take 1 tablet (20 mg total) by mouth daily.   spironolactone 25 MG tablet Commonly known as: ALDACTONE Take 1/2 tablet (12.5 mg total) by mouth daily.        Disposition and  follow-up:   Ms.Brittany Greene was discharged from [facility] in Good condition.  At the hospital follow up visit please address:  1.   HFrEF -Assess volume status -Assess blood pressures appropriately controlled -Assess for dyspnea on exertion or chest pain -Assess compliance with medications -Ensure patient has outpatient cardiology follow-up  Hypertension -Assess if BP has been better controlled and adjust medications as needed  Hypokalemia Hypomagnesemia -BMP -Magnesium Level  CKD Stage 3a -BMP to assess kidney function and electrolytes  HLD -Fasting lipid panel -Titrate statin as needed  Acute on Chronic Anemia -CBC to continue monitoring  Insomnia -Assess sleep hygiene and need for sleep medication  2.  Labs / imaging needed at time of follow-up: CBC, BMP, Magnesium level, lipid panel  3.  Pending labs/ test needing follow-up: Lipoprotein A  Follow-up Appointments:  Follow-up Information     Everardo Beals, NP. Schedule an appointment as soon as possible for a visit.   Why: Call to make hospital follow up visit for 1-2 weeks after discharge. Contact information: [place] [address] [phone]         Leanor Kail, [place] Follow up on 11/28/2021.   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 Hospital course is detailed below:  Acute on Chronic HFrEF exacerbation Patient  presented with acute dyspnea and chest pressure. Patient was started on nitro drip due to persistent elevations in BP and chest pains. Trops were elevated to 20s but flat. BNP elevated to 2374 and CXR showed mild pulmonary edema suspected to be secondary to acute exacerbation of HFrEF leading to elevated BP and flash pulmonary edema. Patient had repeat echo performed that showed EF 30-35%, G2DD, and severe right and left atrial dilation which was unchanged from prior echocardiogram. LHC/RHC did not show obstructing CAD. Patient was started on IV Lasix for which she had appropriate UOP and net output 2.4 L, although her weigh was largely unchanged at 65.9 kg at discharge. Patient was started on multiple GDMT including aspirin, jaridance, coreg, crestor, cozaar, aldactone, and lasix. Patient denied any further chest pain or dyspnea after being admitted to hospital. Plan for patient to transition from cozaar to [facility] as an outpatient doctor.  Hypertension Blood Pressure on admission was 189/87. With initial nitro gtt, iv diuresis and above GDMT, patient's BP began to improve down to 159/76. Recommend outpatient monitoring and titration of anti-hypertensives.  Hypokalemia Hypomagnesemia Repleted as need with IV magnesium sulfate and oral potassium.  Hyperlipidemia Switched from zocor to crestor 20 for high intensity statin.  Acute on chronic anemia Briefly noted to have scant blood in vomit once and initial drop in hemoglobin down to 9.5. Appears to have been stable ~10-11 during hospitalization and likely secondary to CKD. Iron panels were wnl.  Insomnia Received ramelteon as needed while in hospital. Reported this was effective for her. Recommend outpatient monitoring.  Prediabetes Discontinued from glipizide and started on jardiance for GDMT. Patient did not require sliding scale insulin this hospitalization and blood glucose never exceeded 200.  Other chronic conditions were medically managed  with home medications and formulary alternatives as necessary (CKD3a, PVD)  Discharge Exam:   BP (!) 159/76 (BP Location: Right Arm)   Pulse 62   Temp 98.3 F (36.8 C) (Oral)   Resp 18   Ht '5\' 4"'$  (1.626 m)   Wt 65.9 kg   SpO2 99%   BMI 24.94 kg/m  Discharge exam:  Physical Exam Constitutional:      Appearance: She is well-developed, NAD HENT:     Head: Normocephalic and atraumatic.  Cardiovascular:     Rate and Rhythm: Normal rate and regular rhythm.     Pulses: Normal pulses, no JVD    Heart sounds: Systolic murmur noted in RUSB  Pulmonary: CTAB  Chest:     Chest wall: No mass or tenderness.  Abdominal:     General: Bowel sounds are normal.     Palpations: Abdomen is soft. There is no mass.     Tenderness: There is no abdominal tenderness. There is no guarding or rebound.  Skin:    General: Skin is warm and dry.     Capillary Refill: Capillary refill takes less than 2 seconds.  Neurological:     General: No focal deficit present.     Mental Status: She is alert and oriented to person, place, and time.  Psychiatric:  Mood and Affect: Mood normal.        Behavior: Behavior normal.     Pertinent Labs, Studies, and Procedures:  CARDIAC CATHETERIZATION  Result Date: 11/12/2021   Ost RCA to Prox RCA lesion is 20% stenosed.   There is no aortic valve stenosis. Nonischemic Cardiomyopathy Angiographically tortuous but relatively normal coronary arteries with no significant disease. Very tortuous and dilated aorta from abdominal aorta through the stented thoracic aorta.  Patent right subclavian anastomosis Essentially normal RHC pressures: RAP 8 mmHg, RVP-EDP 29/0-11 mmHg; PAP-mean 29/13-18 mmHg, PCWP 14 mmHg, LVP-EDP 158/9-16 mmHg, AOP-MAP 156/75-105 mmHg.;  Ao sat 93%, PA sat 57%; Cardiac Output-Index (Fick) reduced: 4.23 - 2.49 RECOMMENDATIONS Antihypertensive management; GDMT for NICM-compensated CHF Gentle post-cath hydration but monitor for volume overload. [name], MD  ECHOCARDIOGRAM COMPLETE  Result Date: 11/11/2021    ECHOCARDIOGRAM REPORT   Patient Name:   Anne-Marie Arenas Date of Exam: 11/11/2021 Medical Rec #:  [id number]    Height:       64.0 in Accession #:    [id number]   Weight:       143.5 lb Date of Birth:  [birth year]    BSA:          1.699 m Patient Age:    24 years     BP:           173/86 mmHg Patient Gender: F            HR:           63 bpm. Exam Location:  Inpatient Procedure: 2D Echo, Cardiac Doppler and Color Doppler Indications:    CHF  History:        Patient has no prior history of Echocardiogram examinations.                 Risk Factors:Diabetes and Hypertension.  Sonographer:    Jyl Heinz Referring Phys: Bertram  1. Left ventricular ejection fraction, by estimation, is 30 to 35%. The left ventricle has moderately decreased function. The left ventricle demonstrates global hypokinesis. The left ventricular internal cavity size was mildly dilated. There is mild concentric left ventricular hypertrophy. Left ventricular diastolic parameters are consistent with Grade II diastolic dysfunction (pseudonormalization).  2. Right ventricular systolic function is normal. The right ventricular size is normal.  3. Left atrial size was severely dilated.  4. Right atrial size was severely dilated.  5. The mitral valve is normal in structure. Moderate mitral valve regurgitation. No evidence of mitral stenosis.  6. The aortic valve is tricuspid. There is mild calcification of the aortic valve. Aortic valve regurgitation is moderate. Aortic valve sclerosis/calcification is present, without any evidence of aortic stenosis.  7. The inferior vena cava is normal in size with greater than 50% respiratory variability, suggesting right atrial pressure of 3 mmHg. FINDINGS  Left Ventricle: Left ventricular ejection fraction, by estimation, is 30 to 35%. The left ventricle has moderately decreased function. The left ventricle demonstrates global  hypokinesis. The left ventricular internal cavity size was mildly dilated. There is mild concentric left ventricular hypertrophy. Left ventricular diastolic parameters are consistent with Grade II diastolic dysfunction (pseudonormalization). Right Ventricle: The right ventricular size is normal. No increase in right ventricular wall thickness. Right ventricular systolic function is normal. Left Atrium: Left atrial size was severely dilated. Right Atrium: Right atrial size was severely dilated. Pericardium: There is no evidence of pericardial effusion. Mitral Valve: The mitral valve is normal in structure. Moderate mitral valve  regurgitation. No evidence of mitral valve stenosis. Tricuspid Valve: The tricuspid valve is normal in structure. Tricuspid valve regurgitation is trivial. No evidence of tricuspid stenosis. Aortic Valve: The aortic valve is tricuspid. There is mild calcification of the aortic valve. Aortic valve regurgitation is moderate. Aortic regurgitation PHT measures 461 msec. Aortic valve sclerosis/calcification is present, without any evidence of aortic stenosis. Aortic valve peak gradient measures 7.5 mmHg. Pulmonic Valve: The pulmonic valve was normal in structure. Pulmonic valve regurgitation is not visualized. No evidence of pulmonic stenosis. Aorta: The aortic root is normal in size and structure. Venous: The inferior vena cava is normal in size with greater than 50% respiratory variability, suggesting right atrial pressure of 3 mmHg. IAS/Shunts: There is right bowing of the interatrial septum, suggestive of elevated left atrial pressure. No atrial level shunt detected by color flow Doppler.  LEFT VENTRICLE PLAX 2D LVIDd:         5.30 cm      Diastology LVIDs:         4.40 cm      LV e' medial:    3.94 cm/s LV PW:         1.20 cm      LV E/e' medial:  22.9 LV IVS:        1.20 cm      LV e' lateral:   4.46 cm/s LVOT diam:     2.20 cm      LV E/e' lateral: 20.2 LV SV:         59 LV SV Index:   34  LVOT Area:     3.80 cm                              3D Volume EF: LV Volumes (MOD)            3D EF:        46 % LV vol d, MOD A2C: 151.0 ml LV EDV:       169 ml LV vol d, MOD A4C: 106.0 ml LV ESV:       91 ml LV vol s, MOD A2C: 94.0 ml  LV SV:        79 ml LV vol s, MOD A4C: 65.8 ml LV SV MOD A2C:     57.0 ml LV SV MOD A4C:     106.0 ml LV SV MOD BP:      50.7 ml RIGHT VENTRICLE            IVC RV Basal diam:  3.60 cm    IVC diam: 1.60 cm RV Mid diam:    3.40 cm RV S prime:     8.76 cm/s TAPSE (M-mode): 1.7 cm LEFT ATRIUM              Index        RIGHT ATRIUM           Index LA diam:        4.80 cm  2.83 cm/m   RA Area:     23.40 cm LA Vol (A2C):   83.6 ml  49.20 ml/m  RA Volume:   68.80 ml  40.49 ml/m LA Vol (A4C):   106.0 ml 62.39 ml/m LA Biplane Vol: 94.8 ml  55.80 ml/m  AORTIC VALVE AV Area (Vmax): 2.66 cm AV Vmax:        137.00 cm/s AV Peak Grad:   7.5 mmHg LVOT  Vmax:      96.00 cm/s LVOT Vmean:     57.300 cm/s LVOT VTI:       0.154 m AI PHT:         461 msec  AORTA Ao Root diam: 4.00 cm Ao Asc diam:  3.40 cm MITRAL VALVE               TRICUSPID VALVE MV Area (PHT): 5.50 cm    TR Peak grad:   17.6 mmHg MV Decel Time: 138 msec    TR Vmax:        210.00 cm/s MR Peak grad: 100.3 mmHg MR Mean grad: 76.0 mmHg    SHUNTS MR Vmax:      500.67 cm/s  Systemic VTI:  0.15 m MR Vmean:     423.0 cm/s   Systemic Diam: 2.20 cm MV E velocity: 90.30 cm/s MV A velocity: 52.00 cm/s MV E/A ratio:  1.74 Glori Bickers MD Electronically signed by Glori Bickers MD Signature Date/Time: 11/11/2021/12:04:29 PM    Final    DG Chest Portable 1 View  Result Date: 11/10/2021 CLINICAL DATA:  81 year old female with history of chest pain. EXAM: PORTABLE CHEST 1 VIEW COMPARISON:  Chest x-ray 11/30/2014. FINDINGS: There is cephalization of the pulmonary vasculature and slight indistinctness of the interstitial markings suggestive of mild pulmonary edema. No definite pleural effusions. Mild cardiomegaly. Severe tortuosity of the  chronically dilated thoracic aorta with endograft noted. Status post median sternotomy. IMPRESSION: 1. The appearance the chest suggests mild congestive heart failure, as above. 2. Postoperative and postprocedural changes, as above. Electronically Signed   By: Vinnie Langton M.D.   On: 11/10/2021 10:32       Latest Ref Rng & Units 11/13/2021    3:28 AM 11/12/2021   10:01 AM 11/12/2021    9:59 AM  CBC  WBC 4.0 - 10.5 K/uL 7.3     Hemoglobin 12.0 - 15.0 g/dL 10.6  11.2  10.9   Hematocrit 36.0 - 46.0 % 33.0  33.0  32.0   Platelets 150 - 400 K/uL 178         Latest Ref Rng & Units 11/13/2021    3:28 AM 11/12/2021   10:01 AM 11/12/2021    9:59 AM  BMP  Glucose 70 - 99 mg/dL 109     BUN 8 - 23 mg/dL 19     Creatinine 0.44 - 1.00 mg/dL 1.05     Sodium 135 - 145 mmol/L 140  144  144   Potassium 3.5 - 5.1 mmol/L 3.9  3.9  3.9   Chloride 98 - 111 mmol/L 104     CO2 22 - 32 mmol/L 23     Calcium 8.9 - 10.3 mg/dL 9.3       Discharge Instructions: Discharge Instructions     (HEART FAILURE PATIENTS) Call MD:  Anytime you have any of the following symptoms: 1) 3 pound weight gain in 24 hours or 5 pounds in 1 week 2) shortness of breath, with or without a dry hacking cough 3) swelling in the hands, feet or stomach 4) if you have to sleep on extra pillows at night in order to breathe.   Complete by: As directed    Call MD for:  difficulty breathing, headache or visual disturbances   Complete by: As directed    Call MD for:  extreme fatigue   Complete by: As directed    Call MD for:  hives   Complete by: As directed  Call MD for:  persistant nausea and vomiting   Complete by: As directed    Call MD for:  redness, tenderness, or signs of infection (pain, swelling, redness, odor or green/yellow discharge around incision site)   Complete by: As directed    Call MD for:  temperature >100.4   Complete by: As directed    Diet - low sodium heart healthy   Complete by: As directed    Discharge  instructions   Complete by: As directed    Dear Ms. Rayman,  It was a pleasure taking care of you while you were in the hospital.  You were admitted due to chest pain that was concerning for worsening of your heart function.  We performed a heart catheterization which showed no blockages of your coronary arteries.  The chest pain was likely due to elevated blood pressure and increased fluid which led to a strain on your heart.  We have discontinued your glipizide and lisinopril.  Please take the medications as prescribed and your after visit summary.  You will be discharged with a new set of medication to control your blood pressure and help with your heart function.   Please follow-up with your primary care doctor in 1 to 2 weeks for hospital follow-up visit.  The cardiologist will set up a follow-up visit for you so you can follow-up outpatient with a cardiologist.  If you have worsening of chest pain or shortness of breath on exertion or noticed that you have significant increase in weight, please contact your primary care doctor or return to the ED.    Take care!  -[facility] [facility]   Increase activity slowly   Complete by: As directed        Signed: France Ravens, MD 11/13/2021, 7:25 PM

## 2021-11-13 NOTE — TOC Transition Note (Signed)
Transition of Care [facility]) - CM/SW Discharge Note   Patient Details  Name: Brittany Greene MRN: [id number] Date of Birth: 1940/08/05  Transition of Care [facility] CM/SW Contact:  Zenon Mayo, RN Phone Number: 11/13/2021, 1:02 PM   Clinical Narrative:    Patient is for dc today, TOC to fill his meds.  Patient states her daughter is being dropped off her at the hospital and she does not drive and will need cab voucher to get home.         Patient Goals and CMS Choice        Discharge Placement                       Discharge Plan and Services                                     Social Determinants of Health (SDOH) Interventions     Readmission Risk Interventions     No data to display

## 2021-11-13 NOTE — Evaluation (Signed)
Physical Therapy Evaluation Patient Details Name: Brittany Greene MRN: [id number] DOB: 05-04-1941 Today's Date: 11/13/2021  History of Present Illness  The pt is an 81 yo female presenting 6/18 with SOB and chest pressure. Pt underwent heart cath 6/20. 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  Clinical Impression  Pt presents to PT with mild deficits in strength and endurance per patient report, however the patient is mobilizing independently at this time. Pt is encouraged to mobilize frequently for the remainder of this admission. Pt requires no further acute PT services. Acute PT signing off.     Recommendations for follow up therapy are one component of a multi-disciplinary discharge planning process, led by the attending physician.  Recommendations may be updated based on patient status, additional functional criteria and insurance authorization.  Follow Up Recommendations No PT follow up      Assistance Recommended at Discharge None  Patient can return home with the following       Equipment Recommendations None recommended by PT  Recommendations for Other Services       Functional Status Assessment Patient has had a recent decline in their functional status and demonstrates the ability to make significant improvements in function in a reasonable and predictable amount of time.     Precautions / Restrictions Precautions Precautions: None Restrictions Weight Bearing Restrictions: No      Mobility  Bed Mobility                    Transfers Overall transfer level: Independent Equipment used: None Transfers: Sit to/from Stand Sit to Stand: Independent                Ambulation/Gait Ambulation/Gait assistance: Independent Gait Distance (Feet): 300 Feet Assistive device: None Gait Pattern/deviations: WFL(Within Functional  Limits) Gait velocity: functional Gait velocity interpretation: >2.62 ft/sec, indicative of community ambulatory   General Gait Details: steady step-through gait  Stairs            Wheelchair Mobility    Modified Rankin (Stroke Patients Only)       Balance Overall balance assessment: Independent                                           Pertinent Vitals/Pain Pain Assessment Pain Assessment: No/denies pain    Home Living Family/patient expects to be discharged to:: Private residence Living Arrangements: Children Available Help at Discharge: Available PRN/intermittently Type of Home: House Home Access: Stairs to enter Entrance Stairs-Rails: None Entrance Stairs-Number of Steps: 1   Home Layout: Two level;Able to live on main level with bedroom/bathroom Home Equipment: Cane - single point;Rollator (4 wheels);Shower seat - built in      Prior Function Prior Level of Function : Independent/Modified Independent;Driving             Mobility Comments: likes to shop and go on cruises       Hand Dominance   Dominant Hand: Right    Extremity/Trunk Assessment   Upper Extremity Assessment Upper Extremity Assessment: Overall WFL for tasks assessed    Lower Extremity Assessment Lower Extremity Assessment: Overall WFL for tasks assessed    Cervical / Trunk Assessment Cervical / Trunk Assessment: Normal  Communication   Communication: No difficulties  Cognition Arousal/Alertness: Awake/alert Behavior During Therapy:  WFL for tasks assessed/performed Overall Cognitive Status: Within Functional Limits for tasks assessed                                          General Comments General comments (skin integrity, edema, etc.): VSS on RA    Exercises     Assessment/Plan    PT Assessment Patient does not need any further PT services  PT Problem List         PT Treatment Interventions      PT Goals (Current goals can  be found in the Care Plan section)       Frequency       Co-evaluation               AM-PAC PT "6 Clicks" Mobility  Outcome Measure Help needed turning from your back to your side while in a flat bed without using bedrails?: None Help needed moving from lying on your back to sitting on the side of a flat bed without using bedrails?: None Help needed moving to and from a bed to a chair (including a wheelchair)?: None Help needed standing up from a chair using your arms (e.g., wheelchair or bedside chair)?: None Help needed to walk in hospital room?: None Help needed climbing 3-5 steps with a railing? : None 6 Click Score: 24    End of Session   Activity Tolerance: Patient tolerated treatment well Patient left: in bed;with call bell/phone within reach Nurse Communication: Mobility status      Time: 0809-0820 PT Time Calculation (min) (ACUTE ONLY): 11 min   Charges:   PT Evaluation $PT Eval Low Complexity: 1 Low          Zenaida Niece, PT, DPT Acute Rehabilitation Office [phone]   Zenaida Niece 11/13/2021, 8:26 AM

## 2021-11-13 NOTE — TOC Benefit Eligibility Note (Addendum)
Patient Teacher, English as a foreign language completed.    The patient is currently admitted and upon discharge could be taking Entresto 24-26 mg.  The current 30 day co-pay is, $47.00.   The patient is currently admitted and upon discharge could be taking Farxiga 10 mg.  The current 30 day co-pay is, $47.00.   The patient is currently admitted and upon discharge could be taking Jardiance 10 mg.  The current 30 day co-pay is, $47.00.   The patient is insured through [place], [place] Patient Advocate Specialist [place] Patient Advocate Team Direct Number: [phone]  Fax: [phone]

## 2021-11-13 NOTE — Telephone Encounter (Signed)
Patient has been scheduled for a [facility] hospital follow-up appointment on 7/06 at 2:20 PM with [facility], [place], per Vin's request.

## 2021-11-14 LAB — LIPOPROTEIN A (LPA): Lipoprotein (a): 223.1 nmol/L — ABNORMAL HIGH (ref <75.0)

## 2021-11-14 NOTE — Telephone Encounter (Signed)
**Note De-Identified Brittany Greene Obfuscation** Patient contacted regarding discharge from [facility] on 11/13/2021.  Patient understands to follow up with provider Robbie Lis, PA-c on [id number] at 2:20 at [address] in [place], [place] [address].  Patient understands discharge instructions? Yes Patient understands medications and regiment? Yes Patient understands to bring all medications to this visit? Yes  Ask patient:  Are you enrolled in My Chart: No and she states that she is not interested.  The pt denies having any CP/discomfort, nausea, vomiting, diaphoresis, dizziness, lightheadedness, SOB, edema, or weight gain. She states that she did weigh herself when she woke up this morning after her 1st urination and her weight was 121 Ibs.   She verified that she does have Dr Oralia Rud office phone number at [facility] so she can call us if she has any questions or concerns. She thanked me for my call.

## 2021-11-27 NOTE — Progress Notes (Signed)
Cardiology Office Note:    Date:  11/28/2021   ID:  [name], DOB [birth year], MRN [id number]  PCP:  Everardo Beals, NP  [facility] Cardiologist:  Werner Lean, MD  [facility] Electrophysiologist:  None   Chief Complaint: Hospital follow-up  History of Present Illness:    Sansa Alkema is a 81 y.o. female with a hx of aortic aneurysm repair 02/2015 with redo sternotomy with debranching of innominate and carotid arteries 09/2017, left carotid-subclavian bypass and TEVAR 09/2017 (Followed at [facility]), chronic celiac artery stenosis, mild nonobstructive CAD by cath 2016, LV dysfunction by echo 08/2021 (new compared to 2022) with mild-moderate AI, moderate MR, HTN, HLD, DM, CKD 3a, chronic appearing anemia (last Hgb 11.2 in 2019), breast CA  presents for follow up.   Admitted 10/2021 for acute on chronic combined CHF with accelerated hypertension. Patient had new drop in EF by echo 08/2021 to 35-40% (done at [facility]) compared to normal in 2022 of unclear etiology but no previous CHF symptomatology before now.  Echo during admission showed LV function of 30 to 35% and grade 2 diastolic dysfunction.  Treated with IV Lasix with approximately 3 L diuresis.  Cardiac cath showed normal coronaries.  Normal right-sided heart pressure.  Here today for follow-up.  Following discharge, patient was seen by PCP and prescribed supplemental potassium for few days due to low K.  Patient reports improved energy since discharge.  She watches her salt intake.  She denies chest pain, shortness of breath, orthopnea, PND, syncope, lower extremity edema or melena.  She is compliant with her medications.  Past Medical History:  Diagnosis Date   AA (aortic aneurysm) (HCC)    hx of aortic aneurysm repair 02/2015 with redo sternotomy with debranching of innominate and carotid arteries 09/2017, left carotid-subclavian bypass and TEVAR 09/2017   Anemia    Aortic regurgitation    Cancer of left breast ([place])     Cardiomyopathy ([place])    Celiac artery stenosis (HCC)    Chronic kidney disease, stage 3a ([place])    High cholesterol    History of blood transfusion    "related to surgery"   Hypertension    Mitral regurgitation    Personal history of chemotherapy 2004   Pneumonia    Type II diabetes mellitus ([place])    dx 2006    Past Surgical History:  Procedure Laterality Date   BACK SURGERY     BREAST BIOPSY Left 2003   BREAST CAPSULECTOMY WITH IMPLANT EXCHANGE Left 07/31/2016   Procedure: LEFT BREAST TOTAL CAPSULECTOMY REMOVE IMPLANT WITH DELAYED RECONSTRUCTION WITH PLACMENT WITH SILICONE GEL IMPLANTS;  Surgeon: Crissie Reese, MD;  Location: [place];  Service: Plastics;  Laterality: Left;   BREAST RECONSTRUCTION Left 07/31/2016   BREAST TOTAL CAPSULECTOMY REMOVE IMPLANT WITH DELAYED RECONSTRUCTION WITH PLACMENT WITH SILICONE GEL IMPLANTS    LUMBAR MICRODISCECTOMY  ~ 1990   MASTECTOMY Left 2003   MASTOPEXY Right 07/31/2016   MASTOPEXY Right 07/31/2016   Procedure: RIGHT MASTOPEXY;  Surgeon: Crissie Reese, MD;  Location: [place];  Service: Plastics;  Laterality: Right;   NASAL SINUS SURGERY  1990s   RIGHT/LEFT HEART CATH AND CORONARY ANGIOGRAPHY N/A 11/12/2021   Procedure: RIGHT/LEFT HEART CATH AND CORONARY ANGIOGRAPHY;  Surgeon: Leonie Man, MD;  Location: [place];  Service: Cardiovascular;  Laterality: N/A;   THORACIC AORTIC ANEURYSM REPAIR  03/06/2015   TONSILLECTOMY     TUBAL LIGATION      Current Medications: Current Meds  Medication  Sig   aspirin 81 MG chewable tablet Chew 81 mg by mouth daily.   carvedilol (COREG) 12.5 MG tablet Take 1 tablet (12.5 mg total) by mouth 2 (two) times daily with a meal.   empagliflozin (JARDIANCE) 10 MG TABS tablet Take 1 tablet (10 mg total) by mouth daily before breakfast.   fluticasone (FLONASE) 50 MCG/ACT nasal spray Place 2 sprays into both nostrils daily as needed for allergies or rhinitis.   furosemide (LASIX) 20 MG tablet Take 1 tablet (20 mg  total) by mouth daily.   losartan (COZAAR) 100 MG tablet Take 1 tablet (100 mg total) by mouth daily with supper.   Multiple Vitamins-Minerals (CENTRUM SILVER 50+WOMEN PO) Take 1 tablet by mouth daily.   rosuvastatin (CRESTOR) 20 MG tablet Take 1 tablet (20 mg total) by mouth daily.   spironolactone (ALDACTONE) 25 MG tablet Take 1/2 tablet (12.5 mg total) by mouth daily.     Allergies:   Patient has no known allergies.   Social History   Socioeconomic History   Marital status: Widowed    Spouse name: Not on file   Number of children: Not on file   Years of education: Not on file   Highest education level: Not on file  Occupational History   Occupation: retired  Tobacco Use   Smoking status: Former    Packs/day: 0.50    Years: 45.00    Total pack years: 22.50    Types: Cigarettes    Quit date: 11/25/2002    Years since quitting: 19.0   Smokeless tobacco: Never  Vaping Use   Vaping Use: Never used  Substance and Sexual Activity   Alcohol use: Yes    Comment: 07/31/2016 "I'll have a couple drinks on holidays or special occasions"   Drug use: No   Sexual activity: Not on file  Other Topics Concern   Not on file  Social History Narrative   Not on file   Social Determinants of Health   Financial Resource Strain: Not on file  Food Insecurity: Not on file  Transportation Needs: Not on file  Physical Activity: Not on file  Stress: Not on file  Social Connections: Not on file     Family History: The patient's family history includes Heart failure in her mother.    ROS:   Please see the history of present illness.    All other systems reviewed and are negative.   EKGs/Labs/Other Studies Reviewed:    The following studies were reviewed today:  Echo 11/11/2021 1. Left ventricular ejection fraction, by estimation, is 30 to 35%. The  left ventricle has moderately decreased function. The left ventricle  demonstrates global hypokinesis. The left ventricular internal cavity  size  was mildly dilated. There is mild  concentric left ventricular hypertrophy. Left ventricular diastolic  parameters are consistent with Grade II diastolic dysfunction  (pseudonormalization).   2. Right ventricular systolic function is normal. The right ventricular  size is normal.   3. Left atrial size was severely dilated.   4. Right atrial size was severely dilated.   5. The mitral valve is normal in structure. Moderate mitral valve  regurgitation. No evidence of mitral stenosis.   6. The aortic valve is tricuspid. There is mild calcification of the  aortic valve. Aortic valve regurgitation is moderate. Aortic valve  sclerosis/calcification is present, without any evidence of aortic  stenosis.   7. The inferior vena cava is normal in size with greater than 50%  respiratory variability, suggesting right  atrial pressure of 3 mmHg.    RIGHT/LEFT HEART CATH AND CORONARY ANGIOGRAPHY 11/12/2021    Conclusion       Ost RCA to Prox RCA lesion is 20% stenosed.   There is no aortic valve stenosis.   Nonischemic Cardiomyopathy Angiographically tortuous but relatively normal coronary arteries with no significant disease. Very tortuous and dilated aorta from abdominal aorta through the stented thoracic aorta.  Patent right subclavian anastomosis   Essentially normal RHC pressures:  RAP 8 mmHg, RVP-EDP 29/0-11 mmHg;  PAP-mean 29/13-18 mmHg, PCWP 14 mmHg,  LVP-EDP 158/9-16 mmHg, AOP-MAP 156/75-105 mmHg.;   Ao sat 93%, PA sat 57%; Cardiac Output-Index (Fick) reduced: 4.23 - 2.49     RECOMMENDATIONS Antihypertensive management; GDMT for NICM-compensated CHF Gentle post-cath hydration but monitor for volume overload.     EKG:  EKG is not ordered today.   Recent Labs: 11/10/2021: ALT 15; B Natriuretic Peptide 2,374.9 11/11/2021: Magnesium 1.8 11/13/2021: BUN 19; Creatinine, Ser 1.05; Hemoglobin 10.6; Platelets 178; Potassium 3.9; Sodium 140  Recent Lipid Panel    Component Value  Date/Time   CHOL 147 11/11/2021 0353   TRIG 81 11/11/2021 0353   HDL 50 11/11/2021 0353   CHOLHDL 2.9 11/11/2021 0353   VLDL 16 11/11/2021 0353   LDLCALC 81 11/11/2021 0353    Physical Exam:    VS:  BP (!) 130/96   Pulse (!) 57   Ht '5\' 4"'$  (1.626 m)   Wt 142 lb (64.4 kg)   SpO2 97%   BMI 24.37 kg/m     Wt Readings from Last 3 Encounters:  11/28/21 142 lb (64.4 kg)  11/13/21 145 lb 4.8 oz (65.9 kg)  01/18/19 149 lb 2 oz (67.6 kg)     GEN:  Well nourished, well developed in no acute distress HEENT: Normal NECK: No JVD; No carotid bruits LYMPHATICS: No lymphadenopathy CARDIAC: RRR, no murmurs, rubs, gallops RESPIRATORY:  Clear to auscultation without rales, wheezing or rhonchi  ABDOMEN: Soft, non-tender, non-distended MUSCULOSKELETAL:  No edema; No deformity  SKIN: Warm and dry NEUROLOGIC:  Alert and oriented x 3 PSYCHIATRIC:  Normal affect   ASSESSMENT AND PLAN:    Chronic combined CHF/nonischemic cardiomyopathy No evidence of CAD by cath.  EF 30 to 35% on most recent echocardiogram.  Continue carvedilol, Jardiance, losartan and spironolactone.  Continue Lasix at current dose.  2. valvular heart disease with moderate MR, mild-moderate AI  -Follow-up with yearly echocardiogram  3.Peripheral vascular disease - aortic aneurysm repair 02/2015 with redo sternotomy with debranching of innominate and carotid arteries 09/2017, left carotid-subclavian bypass and TEVAR 09/2017, chronic celiac artery stenosis - follow with vascular  4. HTN -Blood pressure improving.  No change today.   Medication Adjustments/Labs and Tests Ordered: Current medicines are reviewed at length with the patient today.  Concerns regarding medicines are outlined above.  No orders of the defined types were placed in this encounter.  No orders of the defined types were placed in this encounter.   Patient Instructions  Medication Instructions:  Your physician recommends that you continue on your  current medications as directed. Please refer to the Current Medication list given to you today. *If you need a refill on your cardiac medications before your next appointment, please call your pharmacy*   Lab Work: None Ordered If you have labs (blood work) drawn today and your tests are completely normal, you will receive your results only by: [place] (if you have MyChart) OR A paper copy in the mail If  you have any lab test that is abnormal or we need to change your treatment, we will call you to review the results.   Testing/Procedures: None ordered   Follow-Up: At [facility], you and your health needs are our priority.  As part of our continuing mission to provide you with exceptional heart care, we have created designated Provider Care Teams.  These Care Teams include your primary Cardiologist (physician) and Advanced Practice Providers (APPs -  Physician Assistants and Nurse Practitioners) who all work together to provide you with the care you need, when you need it.  We recommend signing up for the patient portal called "MyChart".  Sign up information is provided on this After Visit Summary.  MyChart is used to connect with patients for Virtual Visits (Telemedicine).  Patients are able to view lab/test results, encounter notes, upcoming appointments, etc.  Non-urgent messages can be sent to your provider as well.   To learn more about what you can do with MyChart, go to [email or web address].    Your next appointment:   3 month(s)  The format for your next appointment:   In Person  Provider:   Werner Lean, MD  or APP   Other Instructions   Important Information About Sugar         Jarrett Soho, [place]  11/28/2021 3:38 PM    [place]

## 2021-11-28 ENCOUNTER — Encounter: Payer: Self-pay | Admitting: Physician Assistant

## 2021-11-28 ENCOUNTER — Ambulatory Visit (INDEPENDENT_AMBULATORY_CARE_PROVIDER_SITE_OTHER): Payer: Medicare Other | Admitting: Physician Assistant

## 2021-11-28 VITALS — BP 130/96 | HR 57 | Ht 64.0 in | Wt 142.0 lb

## 2021-11-28 DIAGNOSIS — I38 Endocarditis, valve unspecified: Secondary | ICD-10-CM

## 2021-11-28 DIAGNOSIS — I5042 Chronic combined systolic (congestive) and diastolic (congestive) heart failure: Secondary | ICD-10-CM | POA: Diagnosis not present

## 2021-11-28 DIAGNOSIS — Z9889 Other specified postprocedural states: Secondary | ICD-10-CM

## 2021-11-28 DIAGNOSIS — I10 Essential (primary) hypertension: Secondary | ICD-10-CM | POA: Diagnosis not present

## 2021-11-28 DIAGNOSIS — Z8679 Personal history of other diseases of the circulatory system: Secondary | ICD-10-CM

## 2021-11-28 NOTE — Patient Instructions (Signed)
Medication Instructions:  Your physician recommends that you continue on your current medications as directed. Please refer to the Current Medication list given to you today. *If you need a refill on your cardiac medications before your next appointment, please call your pharmacy*   Lab Work: None Ordered If you have labs (blood work) drawn today and your tests are completely normal, you will receive your results only by: [place] (if you have MyChart) OR A paper copy in the mail If you have any lab test that is abnormal or we need to change your treatment, we will call you to review the results.   Testing/Procedures: None ordered   Follow-Up: At [facility], you and your health needs are our priority.  As part of our continuing mission to provide you with exceptional heart care, we have created designated Provider Care Teams.  These Care Teams include your primary Cardiologist (physician) and Advanced Practice Providers (APPs -  Physician Assistants and Nurse Practitioners) who all work together to provide you with the care you need, when you need it.  We recommend signing up for the patient portal called "MyChart".  Sign up information is provided on this After Visit Summary.  MyChart is used to connect with patients for Virtual Visits (Telemedicine).  Patients are able to view lab/test results, encounter notes, upcoming appointments, etc.  Non-urgent messages can be sent to your provider as well.   To learn more about what you can do with MyChart, go to [email or web address].    Your next appointment:   3 month(s)  The format for your next appointment:   In Person  Provider:   Werner Lean, MD  or APP   Other Instructions   Important Information About Sugar

## 2021-12-02 ENCOUNTER — Other Ambulatory Visit: Payer: Self-pay | Admitting: *Deleted

## 2021-12-02 DIAGNOSIS — Z1231 Encounter for screening mammogram for malignant neoplasm of breast: Secondary | ICD-10-CM

## 2021-12-12 ENCOUNTER — Telehealth: Payer: Self-pay | Admitting: Physician Assistant

## 2021-12-12 MED ORDER — EMPAGLIFLOZIN 10 MG PO TABS: 10.0000 mg | ORAL_TABLET | Freq: Every day | ORAL | 3 refills | Status: DC

## 2021-12-12 MED ORDER — CARVEDILOL 12.5 MG PO TABS: 12.5000 mg | ORAL_TABLET | Freq: Two times a day (BID) | ORAL | 3 refills | Status: DC

## 2021-12-12 MED ORDER — SPIRONOLACTONE 25 MG PO TABS: 12.5000 mg | ORAL_TABLET | Freq: Every day | ORAL | 3 refills | Status: DC

## 2021-12-12 MED ORDER — FUROSEMIDE 20 MG PO TABS: 20.0000 mg | ORAL_TABLET | Freq: Every day | ORAL | 3 refills | Status: DC

## 2021-12-12 MED ORDER — ROSUVASTATIN CALCIUM 20 MG PO TABS: 20.0000 mg | ORAL_TABLET | Freq: Every day | ORAL | 3 refills | Status: DC

## 2021-12-12 MED ORDER — LOSARTAN POTASSIUM 100 MG PO TABS: 100.0000 mg | ORAL_TABLET | Freq: Every day | ORAL | 3 refills | Status: DC

## 2021-12-12 NOTE — Telephone Encounter (Signed)
 *  STAT* If patient is at the pharmacy, call can be transferred to refill team.   1. Which medications need to be refilled? (please list name of each medication and dose if known)  carvedilol (COREG) 12.5 MG tablet  empagliflozin (JARDIANCE) 10 MG TABS tablet  furosemide (LASIX) 20 MG tablet losartan (COZAAR) 100 MG tablet rosuvastatin (CRESTOR) 20 MG tablet spironolactone (ALDACTONE) 25 MG tablet  2. Which pharmacy/location (including street and city if local pharmacy) is medication to be sent to? [id number] [place] [place] [address]. Do they need a 30 day or 90 day supply? 90 days  Pt is out of meds need refill today

## 2022-01-13 ENCOUNTER — Ambulatory Visit
Admission: RE | Admit: 2022-01-13 | Discharge: 2022-01-13 | Disposition: A | Discharge status: Home / Self Care | Payer: Medicare Other | Source: Ambulatory Visit | Attending: *Deleted | Admitting: *Deleted

## 2022-01-13 DIAGNOSIS — Z1231 Encounter for screening mammogram for malignant neoplasm of breast: Secondary | ICD-10-CM

## 2022-02-26 ENCOUNTER — Ambulatory Visit: Payer: Medicare Other | Attending: Internal Medicine | Admitting: Internal Medicine

## 2022-02-26 ENCOUNTER — Encounter: Payer: Self-pay | Admitting: Internal Medicine

## 2022-02-26 VITALS — BP 140/56 | HR 56 | Ht 64.0 in | Wt 148.0 lb

## 2022-02-26 DIAGNOSIS — I716 Thoracoabdominal aortic aneurysm, without rupture, unspecified: Secondary | ICD-10-CM

## 2022-02-26 DIAGNOSIS — Z9889 Other specified postprocedural states: Secondary | ICD-10-CM | POA: Insufficient documentation

## 2022-02-26 DIAGNOSIS — I420 Dilated cardiomyopathy: Secondary | ICD-10-CM

## 2022-02-26 DIAGNOSIS — I5042 Chronic combined systolic (congestive) and diastolic (congestive) heart failure: Secondary | ICD-10-CM | POA: Diagnosis present

## 2022-02-26 DIAGNOSIS — I5043 Acute on chronic combined systolic (congestive) and diastolic (congestive) heart failure: Secondary | ICD-10-CM | POA: Diagnosis present

## 2022-02-26 DIAGNOSIS — Z8679 Personal history of other diseases of the circulatory system: Secondary | ICD-10-CM | POA: Diagnosis present

## 2022-02-26 MED ORDER — SACUBITRIL-VALSARTAN 49-51 MG PO TABS: 1.0000 | ORAL_TABLET | Freq: Two times a day (BID) | ORAL | 3 refills | Status: DC

## 2022-02-26 NOTE — Progress Notes (Signed)
Cardiology Office Note:    Date:  02/26/2022   ID:  Brittany Greene, DOB 1940-06-25, MRN [id number]  PCP:  Everardo Beals, NP   [place] Providers Cardiologist:  Werner Lean, MD     Referring MD: Everardo Beals, NP   CC:  Heart failure f/u  History of Present Illness:    Brittany Greene is a 81 y.o. female with a hx of Mild non obstructive CAD, HFrEF (EF 30-35%), Moderate MR, Mild to Moderate AI, PAD s/p L carotid-subclavian bypass and TEVAR [facility]), prior celia artery stenosis, HTN with DM, HLD, and CKD Stage IIIa, breast cancer with unclear chemotherapy and no radiation, who presents for evaluation after HF admission June 2023.  Started on GDMT and s/p outpatient f/u visit.  Patient notes that she is doing well.   Since last visit notes issues with arthritis . There are no interval hospital/ED visit.   Is having leg pain and has questions on celebrex.  No chest pain or pressure .  No SOB/DOE and no PND/Orthopnea.  No weight gain or leg swelling.  No palpitations or syncope.  Past Medical History:  Diagnosis Date   AA (aortic aneurysm) (HCC)    hx of aortic aneurysm repair 02/2015 with redo sternotomy with debranching of innominate and carotid arteries 09/2017, left carotid-subclavian bypass and TEVAR 09/2017   Anemia    Aortic regurgitation    Cancer of left breast ([place])    Cardiomyopathy ([place])    Celiac artery stenosis (HCC)    Chronic kidney disease, stage 3a ([place])    High cholesterol    History of blood transfusion    "related to surgery"   Hypertension    Mitral regurgitation    Personal history of chemotherapy 2004   Pneumonia    Type II diabetes mellitus ([place])    dx 2006    Past Surgical History:  Procedure Laterality Date   BACK SURGERY     BREAST BIOPSY Left 2003   BREAST CAPSULECTOMY WITH IMPLANT EXCHANGE Left 07/31/2016   Procedure: LEFT BREAST TOTAL CAPSULECTOMY REMOVE IMPLANT WITH DELAYED RECONSTRUCTION WITH PLACMENT WITH SILICONE  GEL IMPLANTS;  Surgeon: Crissie Reese, MD;  Location: [place];  Service: Plastics;  Laterality: Left;   BREAST RECONSTRUCTION Left 07/31/2016   BREAST TOTAL CAPSULECTOMY REMOVE IMPLANT WITH DELAYED RECONSTRUCTION WITH PLACMENT WITH SILICONE GEL IMPLANTS    LUMBAR MICRODISCECTOMY  ~ 1990   MASTECTOMY Left 2003   MASTOPEXY Right 07/31/2016   MASTOPEXY Right 07/31/2016   Procedure: RIGHT MASTOPEXY;  Surgeon: Crissie Reese, MD;  Location: [place];  Service: Plastics;  Laterality: Right;   NASAL SINUS SURGERY  1990s   RIGHT/LEFT HEART CATH AND CORONARY ANGIOGRAPHY N/A 11/12/2021   Procedure: RIGHT/LEFT HEART CATH AND CORONARY ANGIOGRAPHY;  Surgeon: Leonie Man, MD;  Location: [place] [facility];  Service: Cardiovascular;  Laterality: N/A;   THORACIC AORTIC ANEURYSM REPAIR  03/06/2015   TONSILLECTOMY     TUBAL LIGATION      Current Medications: Current Meds  Medication Sig   aspirin 81 MG chewable tablet Chew 81 mg by mouth daily.   carvedilol (COREG) 12.5 MG tablet Take 1 tablet (12.5 mg total) by mouth 2 (two) times daily with a meal.   empagliflozin (JARDIANCE) 10 MG TABS tablet Take 1 tablet (10 mg total) by mouth daily before breakfast.   fluticasone (FLONASE) 50 MCG/ACT nasal spray Place 2 sprays into both nostrils daily as needed for allergies or rhinitis.   furosemide (LASIX) 20  MG tablet Take 1 tablet (20 mg total) by mouth daily.   Multiple Vitamins-Minerals (CENTRUM SILVER 50+WOMEN PO) Take 1 tablet by mouth daily.   rosuvastatin (CRESTOR) 20 MG tablet Take 1 tablet (20 mg total) by mouth daily.   sacubitril-valsartan (ENTRESTO) 49-51 MG Take 1 tablet by mouth 2 (two) times daily.   spironolactone (ALDACTONE) 25 MG tablet Take 1/2 tablet (12.5 mg total) by mouth daily.   [DISCONTINUED] losartan (COZAAR) 100 MG tablet Take 1 tablet (100 mg total) by mouth daily with supper.     Allergies:   Patient has no known allergies.   Social History   Socioeconomic History   Marital status:  Widowed    Spouse name: Not on file   Number of children: Not on file   Years of education: Not on file   Highest education level: Not on file  Occupational History   Occupation: retired  Tobacco Use   Smoking status: Former    Packs/day: 0.50    Years: 45.00    Total pack years: 22.50    Types: Cigarettes    Quit date: 11/25/2002    Years since quitting: 19.2   Smokeless tobacco: Never  Vaping Use   Vaping Use: Never used  Substance and Sexual Activity   Alcohol use: Yes    Comment: 07/31/2016 "I'll have a couple drinks on holidays or special occasions"   Drug use: No   Sexual activity: Not on file  Other Topics Concern   Not on file  Social History Narrative   Not on file   Social Determinants of Health   Financial Resource Strain: Not on file  Food Insecurity: Not on file  Transportation Needs: Not on file  Physical Activity: Not on file  Stress: Not on file  Social Connections: Not on file     Family History: The patient's family history includes Heart failure in her mother.  ROS:   Please see the history of present illness.     All other systems reviewed and are negative.  EKGs/Labs/Other Studies Reviewed:    The following studies were reviewed today:   RIGHT/LEFT HEART CATH AND CORONARY ANGIOGRAPHY 11/12/2021  Narrative   Ost RCA to Prox RCA lesion is 20% stenosed.   There is no aortic valve stenosis.  Nonischemic Cardiomyopathy Angiographically tortuous but relatively normal coronary arteries with no significant disease. Very tortuous and dilated aorta from abdominal aorta through the stented thoracic aorta.  Patent right subclavian anastomosis  Essentially normal RHC pressures: RAP 8 mmHg, RVP-EDP 29/0-11 mmHg; PAP-mean 29/13-18 mmHg, PCWP 14 mmHg, LVP-EDP 158/9-16 mmHg, AOP-MAP 156/75-105 mmHg.; Ao sat 93%, PA sat 57%; Cardiac Output-Index (Fick) reduced: 4.23 - 2.49   RECOMMENDATIONS Antihypertensive management; GDMT for NICM-compensated  CHF Gentle post-cath hydration but monitor for volume overload.     ECHO COMPLETE WO IMAGING ENHANCING AGENT 11/11/2021  Narrative ECHOCARDIOGRAM REPORT    Patient Name:   Brittany Greene Date of Exam: 11/11/2021 Medical Rec #:  [id number]    Height:       64.0 in Accession #:    [id number]   Weight:       143.5 lb Date of Birth:  09-09-1940    BSA:          1.699 m Patient Age:    21 years     BP:           173/86 mmHg Patient Gender: F            HR:  63 bpm. Exam Location:  Inpatient  Procedure: 2D Echo, Cardiac Doppler and Color Doppler  Indications:    CHF  History:        Patient has no prior history of Echocardiogram examinations. Risk Factors:Diabetes and Hypertension.  Sonographer:    Jyl Heinz Referring Phys: [place]   1. Left ventricular ejection fraction, by estimation, is 30 to 35%. The left ventricle has moderately decreased function. The left ventricle demonstrates global hypokinesis. The left ventricular internal cavity size was mildly dilated. There is mild concentric left ventricular hypertrophy. Left ventricular diastolic parameters are consistent with Grade II diastolic dysfunction (pseudonormalization). 2. Right ventricular systolic function is normal. The right ventricular size is normal. 3. Left atrial size was severely dilated. 4. Right atrial size was severely dilated. 5. The mitral valve is normal in structure. Moderate mitral valve regurgitation. No evidence of mitral stenosis. 6. The aortic valve is tricuspid. There is mild calcification of the aortic valve. Aortic valve regurgitation is moderate. Aortic valve sclerosis/calcification is present, without any evidence of aortic stenosis. 7. The inferior vena cava is normal in size with greater than 50% respiratory variability, suggesting right atrial pressure of 3 mmHg.  FINDINGS Left Ventricle: Left ventricular ejection fraction, by estimation, is 30 to 35%. The  left ventricle has moderately decreased function. The left ventricle demonstrates global hypokinesis. The left ventricular internal cavity size was mildly dilated. There is mild concentric left ventricular hypertrophy. Left ventricular diastolic parameters are consistent with Grade II diastolic dysfunction (pseudonormalization).  Right Ventricle: The right ventricular size is normal. No increase in right ventricular wall thickness. Right ventricular systolic function is normal.  Left Atrium: Left atrial size was severely dilated.  Right Atrium: Right atrial size was severely dilated.  Pericardium: There is no evidence of pericardial effusion.  Mitral Valve: The mitral valve is normal in structure. Moderate mitral valve regurgitation. No evidence of mitral valve stenosis.  Tricuspid Valve: The tricuspid valve is normal in structure. Tricuspid valve regurgitation is trivial. No evidence of tricuspid stenosis.  Aortic Valve: The aortic valve is tricuspid. There is mild calcification of the aortic valve. Aortic valve regurgitation is moderate. Aortic regurgitation PHT measures 461 msec. Aortic valve sclerosis/calcification is present, without any evidence of aortic stenosis. Aortic valve peak gradient measures 7.5 mmHg.  Pulmonic Valve: The pulmonic valve was normal in structure. Pulmonic valve regurgitation is not visualized. No evidence of pulmonic stenosis.  Aorta: The aortic root is normal in size and structure.  Venous: The inferior vena cava is normal in size with greater than 50% respiratory variability, suggesting right atrial pressure of 3 mmHg.  IAS/Shunts: There is right bowing of the interatrial septum, suggestive of elevated left atrial pressure. No atrial level shunt detected by color flow Doppler.   LEFT VENTRICLE PLAX 2D LVIDd:         5.30 cm      Diastology LVIDs:         4.40 cm      LV e' medial:    3.94 cm/s LV PW:         1.20 cm      LV E/e' medial:  22.9 LV IVS:         1.20 cm      LV e' lateral:   4.46 cm/s LVOT diam:     2.20 cm      LV E/e' lateral: 20.2 LV SV:         59 LV SV Index:  34 LVOT Area:     3.80 cm  3D Volume EF: LV Volumes (MOD)            3D EF:        46 % LV vol d, MOD A2C: 151.0 ml LV EDV:       169 ml LV vol d, MOD A4C: 106.0 ml LV ESV:       91 ml LV vol s, MOD A2C: 94.0 ml  LV SV:        79 ml LV vol s, MOD A4C: 65.8 ml LV SV MOD A2C:     57.0 ml LV SV MOD A4C:     106.0 ml LV SV MOD BP:      50.7 ml  RIGHT VENTRICLE            IVC RV Basal diam:  3.60 cm    IVC diam: 1.60 cm RV Mid diam:    3.40 cm RV S prime:     8.76 cm/s TAPSE (M-mode): 1.7 cm  LEFT ATRIUM              Index        RIGHT ATRIUM           Index LA diam:        4.80 cm  2.83 cm/m   RA Area:     23.40 cm LA Vol (A2C):   83.6 ml  49.20 ml/m  RA Volume:   68.80 ml  40.49 ml/m LA Vol (A4C):   106.0 ml 62.39 ml/m LA Biplane Vol: 94.8 ml  55.80 ml/m AORTIC VALVE AV Area (Vmax): 2.66 cm AV Vmax:        137.00 cm/s AV Peak Grad:   7.5 mmHg LVOT Vmax:      96.00 cm/s LVOT Vmean:     57.300 cm/s LVOT VTI:       0.154 m AI PHT:         461 msec  AORTA Ao Root diam: 4.00 cm Ao Asc diam:  3.40 cm  MITRAL VALVE               TRICUSPID VALVE MV Area (PHT): 5.50 cm    TR Peak grad:   17.6 mmHg MV Decel Time: 138 msec    TR Vmax:        210.00 cm/s MR Peak grad: 100.3 mmHg MR Mean grad: 76.0 mmHg    SHUNTS MR Vmax:      500.67 cm/s  Systemic VTI:  0.15 m MR Vmean:     423.0 cm/s   Systemic Diam: 2.20 cm MV E velocity: 90.30 cm/s MV A velocity: 52.00 cm/s MV E/A ratio:  1.74  Glori Bickers MD Electronically signed by Glori Bickers MD Signature Date/Time: 11/11/2021/12:04:29 PM  Recent Labs: 11/10/2021: ALT 15; B Natriuretic Peptide 2,374.9 11/11/2021: Magnesium 1.8 11/13/2021: BUN 19; Creatinine, Ser 1.05; Hemoglobin 10.6; Platelets 178; Potassium 3.9; Sodium 140  Recent Lipid Panel    Component Value Date/Time   CHOL 147  11/11/2021 0353   TRIG 81 11/11/2021 0353   HDL 50 11/11/2021 0353   CHOLHDL 2.9 11/11/2021 0353   VLDL 16 11/11/2021 0353   LDLCALC 81 11/11/2021 0353     Physical Exam:    VS:  BP (!) 140/56   Pulse (!) 56   Ht '5\' 4"'$  (1.626 m)   Wt 148 lb (67.1 kg)   SpO2 97%   BMI 25.40 kg/m     Wt Readings  from Last 3 Encounters:  02/26/22 148 lb (67.1 kg)  11/28/21 142 lb (64.4 kg)  11/13/21 145 lb 4.8 oz (65.9 kg)    Gen: No distress   Neck: No JVD Cardiac: No Rubs or Gallops, systolic and diastolic murmur, regular bradycardia, +2 radial pulses Respiratory: Clear to auscultation bilaterally, normal effort, normal  respiratory rate GI: Soft, nontender, non-distended  MS: No  edema;  moves all extremities Integument: Skin feels warm Neuro:  At time of evaluation, alert and oriented to person/place/time/situation  Psych: Normal affect, patient feels well   ASSESSMENT:    1. Chronic combined systolic and diastolic CHF (congestive heart failure) ([place])   2. DCM (dilated cardiomyopathy) ([place])   3. Acute on chronic combined systolic and diastolic congestive heart failure ([place])   4. Thoracoabdominal aortic aneurysm (TAAA) without rupture, unspecified part ([place])   5. S/P ascending aortic aneurysm repair     PLAN:    Heart Failure Reduced Ejection Fraction  Breast Cancer with prior chemotherapy (unknown) Mitral Regurgitation Aortic Regurgitation HTN with DM CKD Stage IIIa - NYHA class I, Stage C, euvolemic, etiology from HTN vs chemotherapy highest on DDC - Diuretic regimen: Lasix 20 mg PO Daily  - Coreg 12.5 mg PO BID with no heart rate room to increase  - BP elevated; will transition to [facility] 49/51, repeat BMP in 1-2 weeks and may increase to highest dose - SGLT2i - aldactone 12.5 mg, may increase in one month based on labs  - will repeat echo in December, based on results will get EP eval for ICD discussion  - CMR in the future for for valve quantification  Mild non  obstructive CAD PAD s/p L carotid-subclavian bypass and TEVAR [facility]) Prior celia artery stenosis Mixed HLD - continue ASA and BB, will need to return rosuvastatin 5 mg  Send a copy of note to Drs. Sammuel Hines and [employer] - may not need Echo additional OSH echo for AI and prior TEVAR      3-4 months me or APP   Medication Adjustments/Labs and Tests Ordered: Current medicines are reviewed at length with the patient today.  Concerns regarding medicines are outlined above.  Orders Placed This Encounter  Procedures   Basic metabolic panel   Pro b natriuretic peptide (BNP)   ECHOCARDIOGRAM COMPLETE   Meds ordered this encounter  Medications   sacubitril-valsartan (ENTRESTO) 49-51 MG    Sig: Take 1 tablet by mouth 2 (two) times daily.    Dispense:  180 tablet    Refill:  3    Patient Instructions  Medication Instructions:  Your physician has recommended you make the following change in your medication:  STOP: losartan START: sacubitril- valsartan (Entresto) 49/51 mg by mouth twice daily   *If you need a refill on your cardiac medications before your next appointment, please call your pharmacy*   Lab Work: IN 1-2 WEEKS" BMP, BNP  If you have labs (blood work) drawn today and your tests are completely normal, you will receive your results only by: [place] (if you have MyChart) OR A paper copy in the mail If you have any lab test that is abnormal or we need to change your treatment, we will call you to review the results.   Testing/Procedures: DEC 2023: Your physician has requested that you have an echocardiogram. Echocardiography is a painless test that uses sound waves to create images of your heart. It provides your doctor with information about the size and shape of your heart and how  well your heart's chambers and valves are working. This procedure takes approximately one hour. There are no restrictions for this procedure.    Follow-Up: At [facility],  you and your health needs are our priority.  As part of our continuing mission to provide you with exceptional heart care, we have created designated Provider Care Teams.  These Care Teams include your primary Cardiologist (physician) and Advanced Practice Providers (APPs -  Physician Assistants and Nurse Practitioners) who all work together to provide you with the care you need, when you need it.  We recommend signing up for the patient portal called "MyChart".  Sign up information is provided on this After Visit Summary.  MyChart is used to connect with patients for Virtual Visits (Telemedicine).  Patients are able to view lab/test results, encounter notes, upcoming appointments, etc.  Non-urgent messages can be sent to your provider as well.   To learn more about what you can do with MyChart, go to [email or web address].    Your next appointment:   3-4 month(s)  The format for your next appointment:   In Person  Provider:   Werner Lean, MD      Important Information About Sugar         Signed, Werner Lean, MD  02/26/2022 12:53 PM    [place]

## 2022-02-26 NOTE — Patient Instructions (Signed)
Medication Instructions:  Your physician has recommended you make the following change in your medication:  STOP: losartan START: sacubitril- valsartan (Entresto) 49/51 mg by mouth twice daily   *If you need a refill on your cardiac medications before your next appointment, please call your pharmacy*   Lab Work: IN 1-2 WEEKS" BMP, BNP  If you have labs (blood work) drawn today and your tests are completely normal, you will receive your results only by: [place] (if you have MyChart) OR A paper copy in the mail If you have any lab test that is abnormal or we need to change your treatment, we will call you to review the results.   Testing/Procedures: DEC 2023: Your physician has requested that you have an echocardiogram. Echocardiography is a painless test that uses sound waves to create images of your heart. It provides your doctor with information about the size and shape of your heart and how well your heart's chambers and valves are working. This procedure takes approximately one hour. There are no restrictions for this procedure.    Follow-Up: At [facility], you and your health needs are our priority.  As part of our continuing mission to provide you with exceptional heart care, we have created designated Provider Care Teams.  These Care Teams include your primary Cardiologist (physician) and Advanced Practice Providers (APPs -  Physician Assistants and Nurse Practitioners) who all work together to provide you with the care you need, when you need it.  We recommend signing up for the patient portal called "MyChart".  Sign up information is provided on this After Visit Summary.  MyChart is used to connect with patients for Virtual Visits (Telemedicine).  Patients are able to view lab/test results, encounter notes, upcoming appointments, etc.  Non-urgent messages can be sent to your provider as well.   To learn more about what you can do with MyChart, go to  [email or web address].    Your next appointment:   3-4 month(s)  The format for your next appointment:   In Person  Provider:   Werner Lean, MD      Important Information About Sugar

## 2022-03-12 ENCOUNTER — Ambulatory Visit: Payer: Medicare Other | Attending: Internal Medicine

## 2022-03-13 LAB — BASIC METABOLIC PANEL
BUN/Creatinine Ratio: 31 — ABNORMAL HIGH (ref 12–28)
BUN: 49 mg/dL — ABNORMAL HIGH (ref 8–27)
CO2: 20 mmol/L (ref 20–29)
Calcium: 9.2 mg/dL (ref 8.7–10.3)
Chloride: 101 mmol/L (ref 96–106)
Creatinine, Ser: 1.56 mg/dL — ABNORMAL HIGH (ref 0.57–1.00)
Glucose: 143 mg/dL — ABNORMAL HIGH (ref 70–99)
Potassium: 5.1 mmol/L (ref 3.5–5.2)
Sodium: 138 mmol/L (ref 134–144)
eGFR: 33 mL/min/{1.73_m2} — ABNORMAL LOW (ref >59)

## 2022-03-13 LAB — PRO B NATRIURETIC PEPTIDE: NT-Pro BNP: 1647 pg/mL — ABNORMAL HIGH (ref 0–738)

## 2022-03-17 ENCOUNTER — Telehealth: Payer: Self-pay

## 2022-03-17 DIAGNOSIS — I5042 Chronic combined systolic (congestive) and diastolic (congestive) heart failure: Secondary | ICD-10-CM

## 2022-03-17 NOTE — Telephone Encounter (Signed)
The patient has been notified of the result and verbalized understanding.  All questions (if any) were answered. Precious Gilding, RN 03/17/2022 10:10 AM   Will come in for follow up labs on 03/31/22.

## 2022-03-17 NOTE — Telephone Encounter (Signed)
-----   Message from Werner Lean, MD sent at 03/14/2022  2:14 PM EDT ----- Increase in Creatinine on current meds Stop MRA; BMP in two weeks - May increase Entresto dose if able

## 2022-03-31 ENCOUNTER — Ambulatory Visit: Payer: Medicare Other | Attending: Internal Medicine

## 2022-03-31 DIAGNOSIS — I5042 Chronic combined systolic (congestive) and diastolic (congestive) heart failure: Secondary | ICD-10-CM

## 2022-03-31 LAB — BASIC METABOLIC PANEL
BUN/Creatinine Ratio: 24 (ref 12–28)
BUN: 39 mg/dL — ABNORMAL HIGH (ref 8–27)
CO2: 24 mmol/L (ref 20–29)
Calcium: 9.2 mg/dL (ref 8.7–10.3)
Chloride: 103 mmol/L (ref 96–106)
Creatinine, Ser: 1.65 mg/dL — ABNORMAL HIGH (ref 0.57–1.00)
Glucose: 145 mg/dL — ABNORMAL HIGH (ref 70–99)
Potassium: 4.2 mmol/L (ref 3.5–5.2)
Sodium: 140 mmol/L (ref 134–144)
eGFR: 31 mL/min/{1.73_m2} — ABNORMAL LOW (ref >59)

## 2022-04-02 ENCOUNTER — Telehealth: Payer: Self-pay

## 2022-04-02 DIAGNOSIS — I5042 Chronic combined systolic (congestive) and diastolic (congestive) heart failure: Secondary | ICD-10-CM

## 2022-04-02 MED ORDER — FUROSEMIDE 20 MG PO TABS: 20.0000 mg | ORAL_TABLET | Freq: Every day | ORAL | 11 refills | Status: DC | PRN

## 2022-04-02 NOTE — Telephone Encounter (Signed)
The patient has been notified of the result and verbalized understanding.  All questions (if any) were answered. Precious Gilding, RN 04/02/2022 2:20 PM   Pt is not agreeable to nephrology referral.  Does not want to add on another Dr at this time.  Advised that MD wants to ensure kidney function does not continue to decrease.  Pt continued to refuse. Compromised with pt will take furosemide 20 mg PO PRN QD.  Will increase fluid intake and have repeat labs on 04/23/22.

## 2022-04-02 NOTE — Telephone Encounter (Signed)
-----   Message from Werner Lean, MD sent at 04/02/2022  8:10 AM EST ----- Results: Creatinine increase despite stopping MRA Plan: Lasix to 20 mg PO PRN SOB/LE swelling/weight gain Nephrology referral  Werner Lean, MD

## 2022-04-23 ENCOUNTER — Ambulatory Visit: Payer: Medicare Other | Attending: Internal Medicine

## 2022-04-23 DIAGNOSIS — I5042 Chronic combined systolic (congestive) and diastolic (congestive) heart failure: Secondary | ICD-10-CM

## 2022-04-23 LAB — BASIC METABOLIC PANEL
BUN/Creatinine Ratio: 19 (ref 12–28)
BUN: 24 mg/dL (ref 8–27)
CO2: 23 mmol/L (ref 20–29)
Calcium: 9.2 mg/dL (ref 8.7–10.3)
Chloride: 106 mmol/L (ref 96–106)
Creatinine, Ser: 1.27 mg/dL — ABNORMAL HIGH (ref 0.57–1.00)
Glucose: 143 mg/dL — ABNORMAL HIGH (ref 70–99)
Potassium: 4.2 mmol/L (ref 3.5–5.2)
Sodium: 141 mmol/L (ref 134–144)
eGFR: 42 mL/min/{1.73_m2} — ABNORMAL LOW (ref >59)

## 2022-04-29 ENCOUNTER — Ambulatory Visit (HOSPITAL_COMMUNITY): Payer: Medicare Other | Attending: Internal Medicine

## 2022-04-29 DIAGNOSIS — I5042 Chronic combined systolic (congestive) and diastolic (congestive) heart failure: Secondary | ICD-10-CM | POA: Diagnosis present

## 2022-04-29 LAB — ECHOCARDIOGRAM COMPLETE
Area-P 1/2: 2.46 cm2
MV M vel: 5.92 m/s
MV Peak grad: 140.2 mmHg
P 1/2 time: 577 msec
S' Lateral: 3.5 cm

## 2022-06-11 ENCOUNTER — Telehealth: Payer: Self-pay

## 2022-06-11 NOTE — Telephone Encounter (Signed)
**Note De-Identified Brittany Greene Obfuscation** I called the pt to provide NPAF/Entresto assistance [phone]) and [employer]/Jardiance [phone]) assistance phone numbers as she will need to call them to ask if she is eligible for approval into their assistance programs and that if advised that she is to either request that each foundation mail her an application to her home address or to offer to print them and leave in the front office for her to pick up.  I got no answer so I left a message on her VM asking her to call Jeani Hawking at Dr Oralia Rud office at [facility] at [phone].

## 2022-06-11 NOTE — Telephone Encounter (Signed)
**Note De-Identified Shatoria [place] Obfuscation** ----- **Note De-Identified Xxavier Noon Obfuscation** Message from Precious Gilding, RN sent at 06/05/2022  9:41 AM EST ----- Regarding: RE: Pt assistance Hi, just wondering if you were able to follow up with this pt.  Elza Rafter, RN  ----- Message ----- From: Precious Gilding, RN Sent: 05/05/2022  11:19 AM EST To: Deliah Boston Corryn Madewell, LPN; Precious Gilding, RN Subject: Pt assistance                                  Hi, I spoke with this pt who expresses the cost of Jardiance and Delene Loll is very high.  I advised pt to apply for pt assistance.  Pt is hesitant does not think will qualify.   I advised pt I will send information to pt assistance coordinator to follow up to see if may qualify for some type of assistance for these medications.   Elza Rafter, RN

## 2022-06-12 NOTE — Telephone Encounter (Signed)
**Note De-Identified Brittany Greene Obfuscation** THe pt states that she has purchased a 90 day supply of both Entresto and Jardiance at the first of this year and that she is ok with the price.  We did discuss pt assistance for both Entresto through [employer] and Jardiance through [employer]. I gave her the phone numbers to reach both foundations and she states that she will call them to get details of each program and will ask if she is now or will be eligible for pt assistance late this year.  She is aware that if she is advised by either or both Pt Asst Foundations that she is or will be eligible for approval in their programs, to request they they both mail her an application to her home address so she will have it on hand if needed.  She verbalized understanding to all information given and thanked me for my call yesterday and for taking her call this morning.

## 2022-07-05 ENCOUNTER — Encounter (HOSPITAL_COMMUNITY): Payer: Self-pay

## 2022-07-05 ENCOUNTER — Inpatient Hospital Stay (HOSPITAL_COMMUNITY)
Admission: EM | Admit: 2022-07-05 | Discharge: 2022-07-10 | DRG: 291 | Disposition: A | Discharge status: Home / Self Care | Payer: Medicare Other | Attending: Internal Medicine | Admitting: Internal Medicine

## 2022-07-05 ENCOUNTER — Emergency Department (HOSPITAL_COMMUNITY): Payer: Medicare Other

## 2022-07-05 DIAGNOSIS — N1831 Chronic kidney disease, stage 3a: Secondary | ICD-10-CM | POA: Diagnosis present

## 2022-07-05 DIAGNOSIS — N179 Acute kidney failure, unspecified: Secondary | ICD-10-CM | POA: Diagnosis present

## 2022-07-05 DIAGNOSIS — Z9221 Personal history of antineoplastic chemotherapy: Secondary | ICD-10-CM

## 2022-07-05 DIAGNOSIS — I6522 Occlusion and stenosis of left carotid artery: Secondary | ICD-10-CM | POA: Diagnosis present

## 2022-07-05 DIAGNOSIS — Z1152 Encounter for screening for COVID-19: Secondary | ICD-10-CM

## 2022-07-05 DIAGNOSIS — Z8679 Personal history of other diseases of the circulatory system: Secondary | ICD-10-CM

## 2022-07-05 DIAGNOSIS — I5023 Acute on chronic systolic (congestive) heart failure: Secondary | ICD-10-CM | POA: Diagnosis present

## 2022-07-05 DIAGNOSIS — I77810 Thoracic aortic ectasia: Secondary | ICD-10-CM | POA: Diagnosis present

## 2022-07-05 DIAGNOSIS — I509 Heart failure, unspecified: Secondary | ICD-10-CM | POA: Diagnosis not present

## 2022-07-05 DIAGNOSIS — I959 Hypotension, unspecified: Secondary | ICD-10-CM | POA: Diagnosis not present

## 2022-07-05 DIAGNOSIS — I130 Hypertensive heart and chronic kidney disease with heart failure and stage 1 through stage 4 chronic kidney disease, or unspecified chronic kidney disease: Principal | ICD-10-CM | POA: Diagnosis present

## 2022-07-05 DIAGNOSIS — I10 Essential (primary) hypertension: Secondary | ICD-10-CM | POA: Diagnosis present

## 2022-07-05 DIAGNOSIS — R1110 Vomiting, unspecified: Secondary | ICD-10-CM | POA: Diagnosis present

## 2022-07-05 DIAGNOSIS — Z87891 Personal history of nicotine dependence: Secondary | ICD-10-CM

## 2022-07-05 DIAGNOSIS — E876 Hypokalemia: Secondary | ICD-10-CM | POA: Diagnosis not present

## 2022-07-05 DIAGNOSIS — Z853 Personal history of malignant neoplasm of breast: Secondary | ICD-10-CM

## 2022-07-05 DIAGNOSIS — I080 Rheumatic disorders of both mitral and aortic valves: Secondary | ICD-10-CM | POA: Diagnosis present

## 2022-07-05 DIAGNOSIS — E7800 Pure hypercholesterolemia, unspecified: Secondary | ICD-10-CM | POA: Diagnosis present

## 2022-07-05 DIAGNOSIS — E1122 Type 2 diabetes mellitus with diabetic chronic kidney disease: Secondary | ICD-10-CM | POA: Diagnosis present

## 2022-07-05 DIAGNOSIS — Z7982 Long term (current) use of aspirin: Secondary | ICD-10-CM

## 2022-07-05 DIAGNOSIS — I161 Hypertensive emergency: Secondary | ICD-10-CM | POA: Diagnosis present

## 2022-07-05 DIAGNOSIS — Z95820 Peripheral vascular angioplasty status with implants and grafts: Secondary | ICD-10-CM

## 2022-07-05 DIAGNOSIS — I2489 Other forms of acute ischemic heart disease: Secondary | ICD-10-CM | POA: Diagnosis present

## 2022-07-05 DIAGNOSIS — E1169 Type 2 diabetes mellitus with other specified complication: Secondary | ICD-10-CM | POA: Diagnosis present

## 2022-07-05 DIAGNOSIS — I2510 Atherosclerotic heart disease of native coronary artery without angina pectoris: Secondary | ICD-10-CM | POA: Diagnosis present

## 2022-07-05 DIAGNOSIS — Z8249 Family history of ischemic heart disease and other diseases of the circulatory system: Secondary | ICD-10-CM

## 2022-07-05 DIAGNOSIS — Z79899 Other long term (current) drug therapy: Secondary | ICD-10-CM

## 2022-07-05 DIAGNOSIS — I429 Cardiomyopathy, unspecified: Secondary | ICD-10-CM | POA: Diagnosis present

## 2022-07-05 DIAGNOSIS — Z7984 Long term (current) use of oral hypoglycemic drugs: Secondary | ICD-10-CM

## 2022-07-05 DIAGNOSIS — I5043 Acute on chronic combined systolic (congestive) and diastolic (congestive) heart failure: Secondary | ICD-10-CM | POA: Diagnosis present

## 2022-07-05 LAB — CBC WITH DIFFERENTIAL/PLATELET
Abs Immature Granulocytes: 0.04 10*3/uL (ref 0.00–0.07)
Basophils Absolute: 0 10*3/uL (ref 0.0–0.1)
Basophils Relative: 0 %
Eosinophils Absolute: 0.1 10*3/uL (ref 0.0–0.5)
Eosinophils Relative: 1 %
HCT: 30.6 % — ABNORMAL LOW (ref 36.0–46.0)
Hemoglobin: 9.5 g/dL — ABNORMAL LOW (ref 12.0–15.0)
Immature Granulocytes: 0 %
Lymphocytes Relative: 11 %
Lymphs Abs: 1.1 10*3/uL (ref 0.7–4.0)
MCH: 25.6 pg — ABNORMAL LOW (ref 26.0–34.0)
MCHC: 31 g/dL (ref 30.0–36.0)
MCV: 82.5 fL (ref 80.0–100.0)
Monocytes Absolute: 0.7 10*3/uL (ref 0.1–1.0)
Monocytes Relative: 7 %
Neutro Abs: 7.7 10*3/uL (ref 1.7–7.7)
Neutrophils Relative %: 81 %
Platelets: 148 10*3/uL — ABNORMAL LOW (ref 150–400)
RBC: 3.71 MIL/uL — ABNORMAL LOW (ref 3.87–5.11)
RDW: 15.7 % — ABNORMAL HIGH (ref 11.5–15.5)
WBC: 9.6 10*3/uL (ref 4.0–10.5)
nRBC: 0 % (ref 0.0–0.2)

## 2022-07-05 LAB — RESP PANEL BY RT-PCR (RSV, FLU A&B, COVID)  RVPGX2
Influenza A by PCR: NEGATIVE
Influenza B by PCR: NEGATIVE
Resp Syncytial Virus by PCR: NEGATIVE
SARS Coronavirus 2 by RT PCR: NEGATIVE

## 2022-07-05 LAB — BRAIN NATRIURETIC PEPTIDE: B Natriuretic Peptide: >4500 pg/mL — ABNORMAL HIGH (ref 0.0–100.0)

## 2022-07-05 NOTE — ED Provider Notes (Signed)
ED ECG REPORT   Date: 07/05/2022  Rate: 64  Rhythm: normal sinus rhythm  QRS Axis: normal  Intervals: PR prolonged  ST/T Wave abnormalities: nonspecific T wave changes  Conduction Disutrbances:none  Narrative Interpretation: Old anteroseptal MI, T wave inversions in inferior and anterolateral leads concerning for ischemia, when compared with ECG of 11/10/2021, no significant changes are seen.  Old EKG Reviewed: unchanged  I have personally reviewed the EKG tracing and agree with the computerized printout as noted.    Delora Fuel, MD [id number] [phone]

## 2022-07-05 NOTE — ED Notes (Signed)
2 attempts made for IV/ blood work without success

## 2022-07-05 NOTE — ED Triage Notes (Signed)
Pt became SOB a couple of hours ago - she was going to PCP tmr but got worse - not able to lay down - had Orthopnea.

## 2022-07-05 NOTE — ED Triage Notes (Signed)
Pt states she has been "vomiting phlegm" as well.

## 2022-07-05 NOTE — ED Provider Notes (Signed)
[facility] Emergency Department Provider Note MRN:  [id number]  Arrival date & time: 07/06/22     Chief Complaint   Shortness of Breath (km)   History of Present Illness   Brittany Greene is a 82 y.o. year-old female presents to the ED with chief complaint of SOB that started today.  She has hx of CHF.  States that she has been compliant with her medications.  Denies any cough or fever. SOB worsens when she lies down flat.  New O2 requirement.  History provided by patient.   Review of Systems  Pertinent positive and negative review of systems noted in HPI.    Physical Exam   Vitals:   07/05/22 2153  BP: (!) 178/89  Pulse: 66  Resp: (!) 22  Temp: 97.9 F (36.6 C)  SpO2: 95%    CONSTITUTIONAL:  well-appearing, NAD NEURO:  Alert and oriented x 3, CN 3-12 grossly intact EYES:  eyes equal and reactive ENT/NECK:  Supple, no stridor  CARDIO:  normal, regular rhythm, appears well-perfused  PULM:  Mildly increased work of breathing, new O2 requirement, crackles bilaterally, diminished in the left lung base GI/GU:  non-distended,  MSK/SPINE:  No gross deformities, no edema, moves all extremities  SKIN:  no rash, atraumatic   *Additional and/or pertinent findings included in MDM below  Diagnostic and Interventional Summary    EKG Interpretation  Date/Time:    Ventricular Rate:    PR Interval:    QRS Duration:   QT Interval:    QTC Calculation:   R Axis:     Text Interpretation:         Labs Reviewed  CBC WITH DIFFERENTIAL/PLATELET - Abnormal; Notable for the following components:      Result Value   RBC 3.71 (*)    Hemoglobin 9.5 (*)    HCT 30.6 (*)    MCH 25.6 (*)    RDW 15.7 (*)    Platelets 148 (*)    All other components within normal limits  BASIC METABOLIC PANEL - Abnormal; Notable for the following components:   Glucose, Bld 141 (*)    BUN 24 (*)    Creatinine, Ser 1.39 (*)    GFR, Estimated 38 (*)    All other components  within normal limits  BRAIN NATRIURETIC PEPTIDE - Abnormal; Notable for the following components:   B Natriuretic Peptide >4,500.0 (*)    All other components within normal limits  TROPONIN I (HIGH SENSITIVITY) - Abnormal; Notable for the following components:   Troponin I (High Sensitivity) 121 (*)    All other components within normal limits  RESP PANEL BY RT-PCR (RSV, FLU A&B, COVID)  [id number]  TROPONIN I (HIGH SENSITIVITY)    DG Chest Port 1 View  Final Result      Medications  furosemide (LASIX) injection 40 mg (40 mg Intravenous Given 07/06/22 0036)     Procedures  /  Critical Care .Critical Care  Performed by: Montine Circle, PA-C Authorized by: Montine Circle, PA-C   Critical care provider statement:    Critical care time (minutes):  45   Critical care was necessary to treat or prevent imminent or life-threatening deterioration of the following conditions:  Respiratory failure   Critical care was time spent personally by me on the following activities:  Development of treatment plan with patient or surrogate, discussions with consultants, evaluation of patient's response to treatment, examination of patient, ordering and review of laboratory studies, ordering and review of radiographic  studies, ordering and performing treatments and interventions, pulse oximetry, re-evaluation of patient's condition and review of old charts   ED Course and Medical Decision Making  I have reviewed the triage vital signs, the nursing notes, and pertinent available records from the EMR.  Social Determinants Affecting Complexity of Care: Patient has no clinically significant social determinants affecting this chief complaint..   ED Course:    Medical Decision Making Patient here with SOB.  Hx of CHF.  Worsening SOB today.  Will check CXR and labs.  Will like need admission.    Amount and/or Complexity of Data Reviewed Labs: ordered.    Details: Trop 121, thought to be demand 2/2 CHF  exacerbation BNP >4500  Radiology: ordered and independent interpretation performed.    Details: Vascular congestion ECG/medicine tests: ordered.  Risk Prescription drug management. Decision regarding hospitalization.     Consultants: I discussed the case with Hospitalist, Dr. Flossie Buffy, who is appreciated for admitting.   Treatment and Plan: Patient's exam and diagnostic results are concerning for CHF exacerbation.  Feel that patient will need admission to the hospital for further treatment and evaluation.    Final Clinical Impressions(s) / ED Diagnoses     ICD-10-CM   1. Acute on chronic congestive heart failure, unspecified heart failure type [facility])  I50.9       ED Discharge Orders     None         Discharge Instructions Discussed with and Provided to Patient:   Discharge Instructions   None      Montine Circle, PA-C 07/06/22 0045    Sherwood Gambler, MD 07/06/22 [phone]

## 2022-07-06 ENCOUNTER — Encounter (HOSPITAL_COMMUNITY): Payer: Self-pay | Admitting: Family Medicine

## 2022-07-06 DIAGNOSIS — I161 Hypertensive emergency: Secondary | ICD-10-CM | POA: Diagnosis present

## 2022-07-06 DIAGNOSIS — Z9221 Personal history of antineoplastic chemotherapy: Secondary | ICD-10-CM | POA: Diagnosis not present

## 2022-07-06 DIAGNOSIS — E1169 Type 2 diabetes mellitus with other specified complication: Secondary | ICD-10-CM | POA: Diagnosis present

## 2022-07-06 DIAGNOSIS — I429 Cardiomyopathy, unspecified: Secondary | ICD-10-CM | POA: Diagnosis present

## 2022-07-06 DIAGNOSIS — I509 Heart failure, unspecified: Secondary | ICD-10-CM | POA: Diagnosis present

## 2022-07-06 DIAGNOSIS — I130 Hypertensive heart and chronic kidney disease with heart failure and stage 1 through stage 4 chronic kidney disease, or unspecified chronic kidney disease: Secondary | ICD-10-CM | POA: Diagnosis present

## 2022-07-06 DIAGNOSIS — R1110 Vomiting, unspecified: Secondary | ICD-10-CM | POA: Diagnosis present

## 2022-07-06 DIAGNOSIS — I080 Rheumatic disorders of both mitral and aortic valves: Secondary | ICD-10-CM | POA: Diagnosis present

## 2022-07-06 DIAGNOSIS — I5043 Acute on chronic combined systolic (congestive) and diastolic (congestive) heart failure: Secondary | ICD-10-CM | POA: Diagnosis present

## 2022-07-06 DIAGNOSIS — I2510 Atherosclerotic heart disease of native coronary artery without angina pectoris: Secondary | ICD-10-CM | POA: Diagnosis present

## 2022-07-06 DIAGNOSIS — I959 Hypotension, unspecified: Secondary | ICD-10-CM | POA: Diagnosis not present

## 2022-07-06 DIAGNOSIS — I6522 Occlusion and stenosis of left carotid artery: Secondary | ICD-10-CM | POA: Diagnosis present

## 2022-07-06 DIAGNOSIS — Z1152 Encounter for screening for COVID-19: Secondary | ICD-10-CM | POA: Diagnosis not present

## 2022-07-06 DIAGNOSIS — I5023 Acute on chronic systolic (congestive) heart failure: Secondary | ICD-10-CM | POA: Diagnosis not present

## 2022-07-06 DIAGNOSIS — N179 Acute kidney failure, unspecified: Secondary | ICD-10-CM | POA: Diagnosis present

## 2022-07-06 DIAGNOSIS — E785 Hyperlipidemia, unspecified: Secondary | ICD-10-CM

## 2022-07-06 DIAGNOSIS — I10 Essential (primary) hypertension: Secondary | ICD-10-CM

## 2022-07-06 DIAGNOSIS — Z95820 Peripheral vascular angioplasty status with implants and grafts: Secondary | ICD-10-CM | POA: Diagnosis not present

## 2022-07-06 DIAGNOSIS — R7989 Other specified abnormal findings of blood chemistry: Secondary | ICD-10-CM | POA: Insufficient documentation

## 2022-07-06 DIAGNOSIS — Z853 Personal history of malignant neoplasm of breast: Secondary | ICD-10-CM | POA: Diagnosis not present

## 2022-07-06 DIAGNOSIS — Z87891 Personal history of nicotine dependence: Secondary | ICD-10-CM | POA: Diagnosis not present

## 2022-07-06 DIAGNOSIS — Z8249 Family history of ischemic heart disease and other diseases of the circulatory system: Secondary | ICD-10-CM | POA: Diagnosis not present

## 2022-07-06 DIAGNOSIS — Z8679 Personal history of other diseases of the circulatory system: Secondary | ICD-10-CM | POA: Diagnosis not present

## 2022-07-06 DIAGNOSIS — E7800 Pure hypercholesterolemia, unspecified: Secondary | ICD-10-CM | POA: Diagnosis present

## 2022-07-06 DIAGNOSIS — E876 Hypokalemia: Secondary | ICD-10-CM | POA: Diagnosis not present

## 2022-07-06 DIAGNOSIS — I77810 Thoracic aortic ectasia: Secondary | ICD-10-CM | POA: Diagnosis present

## 2022-07-06 DIAGNOSIS — N1831 Chronic kidney disease, stage 3a: Secondary | ICD-10-CM | POA: Diagnosis present

## 2022-07-06 DIAGNOSIS — I2489 Other forms of acute ischemic heart disease: Secondary | ICD-10-CM | POA: Diagnosis present

## 2022-07-06 DIAGNOSIS — E1122 Type 2 diabetes mellitus with diabetic chronic kidney disease: Secondary | ICD-10-CM | POA: Diagnosis present

## 2022-07-06 LAB — CBC
HCT: 32.1 % — ABNORMAL LOW (ref 36.0–46.0)
Hemoglobin: 9.9 g/dL — ABNORMAL LOW (ref 12.0–15.0)
MCH: 25.3 pg — ABNORMAL LOW (ref 26.0–34.0)
MCHC: 30.8 g/dL (ref 30.0–36.0)
MCV: 81.9 fL (ref 80.0–100.0)
Platelets: 142 10*3/uL — ABNORMAL LOW (ref 150–400)
RBC: 3.92 MIL/uL (ref 3.87–5.11)
RDW: 15.6 % — ABNORMAL HIGH (ref 11.5–15.5)
WBC: 9.4 10*3/uL (ref 4.0–10.5)
nRBC: 0 % (ref 0.0–0.2)

## 2022-07-06 LAB — BASIC METABOLIC PANEL
Anion gap: 11 (ref 5–15)
Anion gap: 16 — ABNORMAL HIGH (ref 5–15)
BUN: 24 mg/dL — ABNORMAL HIGH (ref 8–23)
BUN: 24 mg/dL — ABNORMAL HIGH (ref 8–23)
CO2: 26 mmol/L (ref 22–32)
CO2: 24 mmol/L (ref 22–32)
Calcium: 9 mg/dL (ref 8.9–10.3)
Calcium: 9.2 mg/dL (ref 8.9–10.3)
Chloride: 102 mmol/L (ref 98–111)
Chloride: 101 mmol/L (ref 98–111)
Creatinine, Ser: 1.39 mg/dL — ABNORMAL HIGH (ref 0.44–1.00)
Creatinine, Ser: 1.41 mg/dL — ABNORMAL HIGH (ref 0.44–1.00)
GFR, Estimated: 38 mL/min — ABNORMAL LOW (ref >60)
GFR, Estimated: 37 mL/min — ABNORMAL LOW (ref >60)
Glucose, Bld: 141 mg/dL — ABNORMAL HIGH (ref 70–99)
Glucose, Bld: 118 mg/dL — ABNORMAL HIGH (ref 70–99)
Potassium: 3.6 mmol/L (ref 3.5–5.1)
Potassium: 3.8 mmol/L (ref 3.5–5.1)
Sodium: 139 mmol/L (ref 135–145)
Sodium: 141 mmol/L (ref 135–145)

## 2022-07-06 LAB — TROPONIN I (HIGH SENSITIVITY)
Troponin I (High Sensitivity): 121 ng/L (ref <18)
Troponin I (High Sensitivity): 131 ng/L (ref <18)

## 2022-07-06 LAB — MRSA NEXT GEN BY PCR, NASAL: MRSA by PCR Next Gen: NOT DETECTED

## 2022-07-06 MED ORDER — SACUBITRIL-VALSARTAN 49-51 MG PO TABS
1.0000 | ORAL_TABLET | Freq: Two times a day (BID) | ORAL | Status: DC
  Administered 2022-07-06 – 2022-07-07 (×3): 1 via ORAL
  Filled 2022-07-06 (×4): qty 1

## 2022-07-06 MED ORDER — MELATONIN 3 MG PO TABS
3.0000 mg | ORAL_TABLET | Freq: Every evening | ORAL | Status: DC | PRN
  Administered 2022-07-07 – 2022-07-09 (×4): 3 mg via ORAL
  Filled 2022-07-06 (×4): qty 1

## 2022-07-06 MED ORDER — HYDRALAZINE HCL 20 MG/ML IJ SOLN
5.0000 mg | INTRAMUSCULAR | Status: DC | PRN
  Administered 2022-07-06 (×2): 5 mg via INTRAVENOUS
  Filled 2022-07-06 (×2): qty 1

## 2022-07-06 MED ORDER — ONDANSETRON HCL 4 MG/2ML IJ SOLN
4.0000 mg | Freq: Four times a day (QID) | INTRAMUSCULAR | Status: DC | PRN
  Administered 2022-07-06: 4 mg via INTRAVENOUS
  Filled 2022-07-06: qty 2

## 2022-07-06 MED ORDER — FUROSEMIDE 10 MG/ML IJ SOLN
40.0000 mg | INTRAMUSCULAR | Status: AC
  Administered 2022-07-06: 40 mg via INTRAVENOUS
  Filled 2022-07-06: qty 4

## 2022-07-06 MED ORDER — HYDROXYZINE HCL 25 MG PO TABS
25.0000 mg | ORAL_TABLET | Freq: Four times a day (QID) | ORAL | Status: AC | PRN
  Administered 2022-07-07 (×2): 25 mg via ORAL
  Filled 2022-07-06 (×2): qty 1

## 2022-07-06 MED ORDER — FUROSEMIDE 10 MG/ML IJ SOLN
60.0000 mg | Freq: Two times a day (BID) | INTRAMUSCULAR | Status: DC
  Administered 2022-07-06 (×2): 60 mg via INTRAVENOUS
  Filled 2022-07-06 (×2): qty 6

## 2022-07-06 MED ORDER — ENOXAPARIN SODIUM 30 MG/0.3ML IJ SOSY
30.0000 mg | PREFILLED_SYRINGE | Freq: Every day | INTRAMUSCULAR | Status: DC
  Administered 2022-07-06 – 2022-07-10 (×5): 30 mg via SUBCUTANEOUS
  Filled 2022-07-06 (×5): qty 0.3

## 2022-07-06 MED ORDER — ISOSORB DINITRATE-HYDRALAZINE 20-37.5 MG PO TABS
1.0000 | ORAL_TABLET | Freq: Three times a day (TID) | ORAL | Status: DC
  Administered 2022-07-06 – 2022-07-07 (×4): 1 via ORAL
  Filled 2022-07-06 (×4): qty 1

## 2022-07-06 MED ORDER — EMPAGLIFLOZIN 10 MG PO TABS
10.0000 mg | ORAL_TABLET | Freq: Every day | ORAL | Status: DC
  Administered 2022-07-06 – 2022-07-10 (×5): 10 mg via ORAL
  Filled 2022-07-06 (×5): qty 1

## 2022-07-06 MED ORDER — CARVEDILOL 12.5 MG PO TABS
12.5000 mg | ORAL_TABLET | Freq: Two times a day (BID) | ORAL | Status: DC
  Administered 2022-07-06 – 2022-07-10 (×8): 12.5 mg via ORAL
  Filled 2022-07-06 (×8): qty 1

## 2022-07-06 MED ORDER — ROSUVASTATIN CALCIUM 20 MG PO TABS
20.0000 mg | ORAL_TABLET | Freq: Every day | ORAL | Status: DC
  Administered 2022-07-06 – 2022-07-10 (×5): 20 mg via ORAL
  Filled 2022-07-06 (×6): qty 1

## 2022-07-06 MED ORDER — SACUBITRIL-VALSARTAN 49-51 MG PO TABS
1.0000 | ORAL_TABLET | Freq: Two times a day (BID) | ORAL | Status: DC
  Administered 2022-07-06: 1 via ORAL
  Filled 2022-07-06 (×2): qty 1

## 2022-07-06 MED ORDER — CARVEDILOL 12.5 MG PO TABS: 12.5000 mg | ORAL_TABLET | Freq: Two times a day (BID) | ORAL | Status: DC

## 2022-07-06 MED ORDER — ASPIRIN 81 MG PO CHEW
81.0000 mg | CHEWABLE_TABLET | Freq: Every day | ORAL | Status: DC
  Administered 2022-07-06 – 2022-07-10 (×5): 81 mg via ORAL
  Filled 2022-07-06 (×6): qty 1

## 2022-07-06 MED ORDER — FUROSEMIDE 10 MG/ML IJ SOLN
40.0000 mg | Freq: Every day | INTRAMUSCULAR | Status: DC
  Filled 2022-07-06: qty 4

## 2022-07-06 NOTE — ED Notes (Signed)
ED TO INPATIENT HANDOFF REPORT  ED Nurse Name and Phone #: Kathie Rhodes [phone]  S Name/Age/Gender Brittany Greene 82 y.o. female Room/Bed: 006C/006C  Code Status   Code Status: Full Code  Home/SNF/Other Home Patient oriented to: Is this baseline? Yes   Triage Complete: Triage complete  Chief Complaint Acute on chronic congestive heart failure ([place]) [I50.9]  Triage Note Pt became SOB a couple of hours ago - she was going to PCP tmr but got worse - not able to lay down - had Orthopnea.   Pt states she has been "vomiting phlegm" as well.    Allergies No Known Allergies  Level of Care/Admitting Diagnosis ED Disposition     ED Disposition  Admit   Condition  --   Comment  Hospital Area: [facility] [[id number]]  Level of Care: Telemetry Cardiac [103]  May admit patient to [facility] or [facility] if equivalent level of care is available:: No  Covid Evaluation: Asymptomatic - no recent exposure (last 10 days) testing not required  Diagnosis: Acute on chronic congestive heart failure [id number]  Admitting Physician: Orene Desanctis [id number]  Attending Physician: Orene Desanctis [id number]  Certification:: I certify this patient will need inpatient services for at least 2 midnights  Estimated Length of Stay: 2          B Medical/Surgery History Past Medical History:  Diagnosis Date   AA (aortic aneurysm) ([place])    hx of aortic aneurysm repair 02/2015 with redo sternotomy with debranching of innominate and carotid arteries 09/2017, left carotid-subclavian bypass and TEVAR 09/2017   Anemia    Aortic regurgitation    Cancer of left breast ([place])    Cardiomyopathy ([place])    Celiac artery stenosis ([place])    Chronic kidney disease, stage 3a ([place])    High cholesterol    History of blood transfusion    "related to surgery"   Hypertension    Mitral regurgitation    Personal history of chemotherapy 2004   Pneumonia    Type II diabetes mellitus ([place])    dx 2006    Past Surgical History:  Procedure Laterality Date   BACK SURGERY     BREAST BIOPSY Left 2003   BREAST CAPSULECTOMY WITH IMPLANT EXCHANGE Left 07/31/2016   Procedure: LEFT BREAST TOTAL CAPSULECTOMY REMOVE IMPLANT WITH DELAYED RECONSTRUCTION WITH PLACMENT WITH SILICONE GEL IMPLANTS;  Surgeon: Crissie Reese, MD;  Location: [facility];  Service: Plastics;  Laterality: Left;   BREAST RECONSTRUCTION Left 07/31/2016   BREAST TOTAL CAPSULECTOMY REMOVE IMPLANT WITH DELAYED RECONSTRUCTION WITH PLACMENT WITH SILICONE GEL IMPLANTS    LUMBAR MICRODISCECTOMY  ~ 1990   MASTECTOMY Left 2003   MASTOPEXY Right 07/31/2016   MASTOPEXY Right 07/31/2016   Procedure: RIGHT MASTOPEXY;  Surgeon: Crissie Reese, MD;  Location: [facility];  Service: Plastics;  Laterality: Right;   NASAL SINUS SURGERY  1990s   RIGHT/LEFT HEART CATH AND CORONARY ANGIOGRAPHY N/A 11/12/2021   Procedure: RIGHT/LEFT HEART CATH AND CORONARY ANGIOGRAPHY;  Surgeon: Leonie Man, MD;  Location: [facility];  Service: Cardiovascular;  Laterality: N/A;   THORACIC AORTIC ANEURYSM REPAIR  03/06/2015   TONSILLECTOMY     TUBAL LIGATION       A IV Location/Drains/Wounds Patient Lines/Drains/Airways Status     Active Line/Drains/Airways     Name Placement date Placement time Site Days   Peripheral IV 07/05/22 20 G 1.88" Right Antecubital 07/05/22  2308  Antecubital  1  Intake/Output Last 24 hours No intake or output data in the 24 hours ending 07/06/22 0319  Labs/Imaging Results for orders placed or performed during the hospital encounter of 07/05/22 (from the past 48 hour(s))  Resp panel by RT-PCR (RSV, Flu A&B, Covid) Anterior Nasal Swab     Status: None   Collection Time: 07/05/22 10:12 PM   Specimen: Anterior Nasal Swab  Result Value Ref Range   SARS Coronavirus 2 by RT PCR NEGATIVE NEGATIVE   Influenza A by PCR NEGATIVE NEGATIVE   Influenza B by PCR NEGATIVE NEGATIVE    Comment: (NOTE) The Xpert Xpress  SARS-CoV-2/FLU/RSV plus assay is intended as an aid in the diagnosis of influenza from Nasopharyngeal swab specimens and should not be used as a sole basis for treatment. Nasal washings and aspirates are unacceptable for Xpert Xpress SARS-CoV-2/FLU/RSV testing.  Fact Sheet for Patients: EntrepreneurPulse.com.au  Fact Sheet for Healthcare Providers: IncredibleEmployment.be  This test is not yet approved or cleared by the [place] FDA and has been authorized for detection and/or diagnosis of SARS-CoV-2 by FDA under an Emergency Use Authorization (EUA). This EUA will remain in effect (meaning this test can be used) for the duration of the COVID-19 declaration under Section 564(b)(1) of the Act, 21 U.S.C. section 360bbb-3(b)(1), unless the authorization is terminated or revoked.     Resp Syncytial Virus by PCR NEGATIVE NEGATIVE    Comment: (NOTE) Fact Sheet for Patients: EntrepreneurPulse.com.au  Fact Sheet for Healthcare Providers: IncredibleEmployment.be  This test is not yet approved or cleared by the [place] FDA and has been authorized for detection and/or diagnosis of SARS-CoV-2 by FDA under an Emergency Use Authorization (EUA). This EUA will remain in effect (meaning this test can be used) for the duration of the COVID-19 declaration under Section 564(b)(1) of the Act, 21 U.S.C. section 360bbb-3(b)(1), unless the authorization is terminated or revoked.  Performed at [facility], [place] [address], [place], [place] [address]   CBC with Differential     Status: Abnormal   Collection Time: 07/05/22 11:11 PM  Result Value Ref Range   WBC 9.6 4.0 - 10.5 K/uL   RBC 3.71 (L) 3.87 - 5.11 MIL/uL   Hemoglobin 9.5 (L) 12.0 - 15.0 g/dL   HCT 30.6 (L) 36.0 - 46.0 %   MCV 82.5 80.0 - 100.0 fL   MCH 25.6 (L) 26.0 - 34.0 pg   MCHC 31.0 30.0 - 36.0 g/dL   RDW 15.7 (H) 11.5 - 15.5 %   Platelets 148  (L) 150 - 400 K/uL   nRBC 0.0 0.0 - 0.2 %   Neutrophils Relative % 81 %   Neutro Abs 7.7 1.7 - 7.7 K/uL   Lymphocytes Relative 11 %   Lymphs Abs 1.1 0.7 - 4.0 K/uL   Monocytes Relative 7 %   Monocytes Absolute 0.7 0.1 - 1.0 K/uL   Eosinophils Relative 1 %   Eosinophils Absolute 0.1 0.0 - 0.5 K/uL   Basophils Relative 0 %   Basophils Absolute 0.0 0.0 - 0.1 K/uL   Immature Granulocytes 0 %   Abs Immature Granulocytes 0.04 0.00 - 0.07 K/uL    Comment: Performed at [facility], [place] [address], [place], [place] [id number]  Basic metabolic panel     Status: Abnormal   Collection Time: 07/05/22 11:11 PM  Result Value Ref Range   Sodium 139 135 - 145 mmol/L   Potassium 3.6 3.5 - 5.1 mmol/L   Chloride 102 98 - 111  mmol/L   CO2 26 22 - 32 mmol/L   Glucose, Bld 141 (H) 70 - 99 mg/dL    Comment: Glucose reference range applies only to samples taken after fasting for at least 8 hours.   BUN 24 (H) 8 - 23 mg/dL   Creatinine, Ser 1.39 (H) 0.44 - 1.00 mg/dL   Calcium 9.0 8.9 - 10.3 mg/dL   GFR, Estimated 38 (L) >60 mL/min    Comment: (NOTE) Calculated using the CKD-EPI Creatinine Equation (2021)    Anion gap 11 5 - 15    Comment: Performed at [facility] [address] [place], [place] [address]  Troponin I (High Sensitivity)     Status: Abnormal   Collection Time: 07/05/22 11:11 PM  Result Value Ref Range   Troponin I (High Sensitivity) 121 (HH) <18 ng/L    Comment: CRITICAL RESULT CALLED TO, READ BACK BY AND VERIFIED WITH Malachy Chamber RN  07/06/22 0006 AMIREHSANI F (NOTE) Elevated high sensitivity troponin I (hsTnI) values and significant  changes across serial measurements may suggest ACS but many other  chronic and acute conditions are known to elevate hsTnI results.  Refer to the "Links" section for chest pain algorithms and additional  guidance. Performed at [facility], [place] [address] [place], [place] [address]   Brain natriuretic peptide     Status: Abnormal    Collection Time: 07/05/22 11:11 PM  Result Value Ref Range   B Natriuretic Peptide >4,500.0 (H) 0.0 - 100.0 pg/mL    Comment: Performed at [facility] [address], [place], [place] [address]  Troponin I (High Sensitivity)     Status: Abnormal   Collection Time: 07/06/22 12:11 AM  Result Value Ref Range   Troponin I (High Sensitivity) 131 (HH) <18 ng/L    Comment: CRITICAL VALUE NOTED. VALUE IS CONSISTENT WITH PREVIOUSLY REPORTED/CALLED VALUE (NOTE) Elevated high sensitivity troponin I (hsTnI) values and significant  changes across serial measurements may suggest ACS but many other  chronic and acute conditions are known to elevate hsTnI results.  Refer to the "Links" section for chest pain algorithms and additional  guidance. Performed at [facility], [place] [address], [place], [place] [address]    DG Chest Port 1 View  Result Date: 07/05/2022 CLINICAL DATA:  Shortness of breath. EXAM: PORTABLE CHEST 1 VIEW COMPARISON:  Chest radiograph dated 11/10/2021. FINDINGS: There is cardiomegaly with vascular congestion and edema. Superimposed pneumonia is not excluded. Clinical correlation is recommended. No large pleural effusion. No pneumothorax. Similar appearance of dilated thoracic aorta and endovascular stent graft repair. No acute osseous pathology. IMPRESSION: Cardiomegaly with vascular congestion and edema. Superimposed pneumonia is not excluded. Electronically Signed   By: Anner Crete M.D.   On: 07/05/2022 22:54    Pending Labs Unresulted Labs (From admission, onward)     Start     Ordered   07/06/22 0500  CBC  Tomorrow morning,   R        07/06/22 0128   07/06/22 [id number]  Basic metabolic panel  Tomorrow morning,   R        07/06/22 0128            Vitals/Pain Today's Vitals   07/06/22 0300 07/06/22 0315 07/06/22 0317 07/06/22 0317  BP: (!) 184/86     Pulse: 66 65    Resp: (!) 23 19    Temp:   97.9 F (36.6 C)   TempSrc:   Oral  SpO2: 94% 95%     Weight:      Height:      PainSc:    0-No pain    Isolation Precautions No active isolations  Medications Medications  aspirin chewable tablet 81 mg (has no administration in time range)  carvedilol (COREG) tablet 12.5 mg (has no administration in time range)  rosuvastatin (CRESTOR) tablet 20 mg (has no administration in time range)  sacubitril-valsartan (ENTRESTO) 49-51 mg per tablet (1 tablet Oral Given 07/06/22 0207)  enoxaparin (LOVENOX) injection 30 mg (has no administration in time range)  furosemide (LASIX) injection 40 mg (has no administration in time range)  furosemide (LASIX) injection 40 mg (40 mg Intravenous Given 07/06/22 0036)    Mobility walks with device     Focused Assessments Cardiac Assessment Handoff:    Lab Results  Component Value Date   TROPONINI <0.03 11/25/2014   No results found for: "DDIMER" Does the Patient currently have chest pain? No    R Recommendations: See Admitting Provider Note  Report given to:   Additional Notes: Pt is alert and oriented, no needs at this time.  She reports she feels "much better already"

## 2022-07-06 NOTE — Progress Notes (Addendum)
Progress Note   PatientGreisy Greene [id number] DOB: 1941-02-18 DOA: 07/05/2022     0 DOS: the patient was seen and examined on 07/06/2022   Brief hospital course: Brittany Greene was admitted to the hospital with the working diagnosis of heart failure exacerbation.   82 yo female with the past medical history of coronary artery disease, heart failure, PAD sp L carotid subclavian bypass and TEVAR, AAA repair, hypertension, CKD and Type 2DM who presented with dyspnea. Patient reported being off diuretics for one month due to reduced GFR. She developed progressive dyspnea and orthopnea, that prompted her to come to the Ed. On her initial physical examination her blood pressure was 178/89, HR 66, RR 22 and 02 saturation 95%, lungs with right rales with no wheezing, heart with S1 and S2  present and rhythmic with positive systolic murmur, abdomen with no distention, no lower extremity edema.   Na 139, K 3,6 Cl 102 bicarbonate 26 glcose 141 bun 24 cr 1,39  BNP >4,500 High sensitive troponin 121 and 131  Wbc 9,6 hgb 9,5 plt 148  Sars covid 19 negative   Chest radiograph with cardiomegaly, with bilateral hilar vascular congestion, bilateral interstitial infiltrates, with cephalization of the vasculature. Fluid in the right fissure.  Sternotomy wires in place, and aortic endovascular stent present.       Assessment and Plan: * Acute on chronic systolic CHF (congestive heart failure) ([place]) 04/2022 Echocardiogram with reduced LV systolic function EF 40 to 45%, with global hypokinesis, RV systolic function preserved, LA and RA with severe dilatation, moderate TR   Documented urine output 0000000 ml Systolic blood pressure [id number] and 191 mmHg.  Plan to continue diuresis with IV furosemide, increase dose to 60 mg IV q12 hrs. Continue with entresto and carvedilol.  Add Bidil for after load reduction and resume empagliflozin.   Hold on spironolactone until renal function more stable.     Hypertension Uncontrolled hypertension with hypertensive emergency. Plan to continue carvedilol and entresto. Add Bidil for after load reduction.   AKI (acute kidney injury) (HCC) CKD stage 3a (base serum cr 1,2).  Renal function with serum cr at 1.41 with K at 3,8 and serum bicarbonate at 24 Na 142 BUN 24.  Plan to continue diuresis with furosemide Resume SGLT2 inh Hold on spironolactone for now.  Follow up renal function and electrolytes in am.  Type 2 diabetes mellitus with hyperlipidemia (HCC) Fasting glucose is 118, 121 Continue glucose monitoring as needed.  Hold on insulin for now.    Continue with statin therapy.        Subjective: patient is feeling better but not yet back to baseline, continue to have elevated blood pressure   Physical Exam: Vitals:   07/06/22 0400 07/06/22 0450 07/06/22 0533 07/06/22 0758  BP: (!) 191/88 (!) 185/89 (!) 200/93 (!) 191/81  Pulse: 63 66  65  Resp: (!) 21 20  (!) 22  Temp:  97.9 F (36.6 C)  98.2 F (36.8 C)  TempSrc:  Oral  Oral  SpO2: 95% 94%  100%  Weight:      Height:  5' 4"$  (1.626 m)     Neurology awake and alert ENT with mild pallor Cardiovascular with S1 and S2 present and rhythmic, mild systolic murmur at the right sternal border Moderate JVD No lower extremity edema Respiratory with mild rales bilaterally with no wheezing or rhonchi Abdomen with no distention  Data Reviewed:    Family Communication: no family at the bedside   Disposition:  Status is: Inpatient Remains inpatient appropriate because: IV diuresis and blood pressure control   Planned Discharge Destination: Home      Author: Tawni Millers, MD 07/06/2022 10:26 AM  For on call review [email or web address].

## 2022-07-06 NOTE — Assessment & Plan Note (Addendum)
04/2022 Echocardiogram with reduced LV systolic function EF 40 to 45%, with global hypokinesis, RV systolic function preserved, LA and RA with severe dilatation, moderate TR   Documented urine output 0000000 ml Systolic blood pressure [id number] and 191 mmHg.  Plan to continue diuresis with IV furosemide, increase dose to 60 mg IV q12 hrs. Continue with entresto and carvedilol.  Add Bidil for after load reduction and resume empagliflozin.   Hold on spironolactone until renal function more stable.

## 2022-07-06 NOTE — Progress Notes (Signed)
Pt c/o being uncomfortable and unable to sleep.  Pt repositioned, pt states she is nauseated and is dry heaving.  Within a few minutes pt c/o epigastric pain.  VSS.  Dr. Sabino Gasser paged.  Orders received, zofran 4 mg IV given, EKG completed.  EKG with critical result.  Dr. Sabino Gasser notified.  Rapid Response Waunita Schooner called and asked to see patient.  Waiting further orders.

## 2022-07-06 NOTE — Assessment & Plan Note (Addendum)
CKD stage 3a (base serum cr 1,2). Hypokalemia   Volume status has improved, renal function today with serum cr at 1,5 with K at 3,2 and serum bicarbonate at 26.   Add 20 Kcl po x1  Hold on spironolactone for now.  Follow up renal function and electrolytes in am.

## 2022-07-06 NOTE — Progress Notes (Signed)
PT Cancellation Note  Patient Details Name: Brittany Greene MRN: [id number] DOB: 1940/11/11   Cancelled Treatment:    Reason Eval/Treat Not Completed: Patient declined stating she was tired. Amb earlier with mobility.   Shary Decamp [facility] 07/06/2022, 12:58 PM Suanne Marker PT Acute [employer] [phone]

## 2022-07-06 NOTE — ED Notes (Signed)
Assumed care of pt, she is alert and oriented.  No issues at this time.  She is very pleased that she has a bed assigned.

## 2022-07-06 NOTE — Plan of Care (Signed)

## 2022-07-06 NOTE — Assessment & Plan Note (Addendum)
Fasting glucose is 170 Continue glucose monitoring as needed.  Hold on insulin for now.    Continue with statin therapy.

## 2022-07-06 NOTE — Assessment & Plan Note (Deleted)
-  Last echo 04/2022 with EF 40-45% with grade II diastolic dysfunction. No valvular abnormalities. Aortic root dilatation up to 57m.  Not on diuretic at home due to labile renal function. Although may need PRN going forward.  -will continue IV '40mg'$  Lasix here and follow intake and output, daily weights

## 2022-07-06 NOTE — Hospital Course (Signed)
Mrs. Bueche was admitted to the hospital with the working diagnosis of heart failure exacerbation.   82 yo female with the past medical history of coronary artery disease, heart failure, PAD sp L carotid subclavian bypass and TEVAR, AAA repair, hypertension, CKD and Type 2DM who presented with dyspnea. Patient reported being off diuretics for one month due to reduced GFR. She developed progressive dyspnea and orthopnea, that prompted her to come to the Ed. On her initial physical examination her blood pressure was 178/89, HR 66, RR 22 and 02 saturation 95%, lungs with right rales with no wheezing, heart with S1 and S2  present and rhythmic with positive systolic murmur, abdomen with no distention, no lower extremity edema.   Na 139, K 3,6 Cl 102 bicarbonate 26 glcose 141 bun 24 cr 1,39  BNP >4,500 High sensitive troponin 121 and 131  Wbc 9,6 hgb 9,5 plt 148  Sars covid 19 negative   Chest radiograph with cardiomegaly, with bilateral hilar vascular congestion, bilateral interstitial infiltrates, with cephalization of the vasculature. Fluid in the right fissure.  Sternotomy wires in place, and aortic endovascular stent present.   02/12 volume status is improving, plan to follow up renal function prior to discharge.  02/13 volume status has improved, pending renal function to stabilize in order to discharge patient home.

## 2022-07-06 NOTE — H&P (Signed)
History and Physical    PatientPurvi Greene [id number] DOB: 1940/10/02 DOA: 07/05/2022 DOS: the patient was seen and examined on 07/06/2022 PCP: Everardo Beals, NP  Patient coming from: Home  Chief Complaint:  Chief Complaint  Patient presents with   Shortness of Breath    km   HPI: Brittany Greene is a 82 y.o. female with medical history significant of CAD, HFrEF (EF 40-45%),  PAD s/p L carotid-subclavian bypass and TEVAR ([facility]), prior celia artery stenosis, AAA s/p repair, HTN with DM, HLD, and CKD Stage IIIa, breast cancer s/p chemotherapy who presents with increasing shortness of breath and orthopnea.   Reports symptoms of dyspnea worse when laying down started today. She was taken off diuretics about a month ago for labile renal function. Denies LE edema but says she never gets edema with her CHF exacerbations. Had an episode of vomiting today. Has been constipation but took a fleet edema with a bowel movement earlier today. No abdominal pain.   In the ED, she was afebrile, BP up to 170/89, not hypoxic but tachypneia  placed on 2L for comfort.   No leukocytosis. Hgb of 9.5/Hct of 30.6 appears diluted from prior.   Mildly elevated creatinine of 1.39 from prior of 1.27. No other electrolyte abnormalities.   BNP >4500. Troponin of 121.  CXR on my review with cardiomegaly, pulmonary edema and endovascular stenting of aorta.   Negative Flu/COVID/RSV.   She was given IV Lasix 73m in ED and hospitalist was called for admission.  Review of Systems: As mentioned in the history of present illness. All other systems reviewed and are negative. 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 [place]    Celiac artery  stenosis (HCC)    Chronic kidney disease, stage 3a ([place]    High cholesterol    History of blood transfusion    "related to surgery"   Hypertension    Mitral regurgitation    Personal history of chemotherapy 2004   Pneumonia    Type II diabetes mellitus ([place]    dx 2006   Past Surgical History:  Procedure Laterality Date   BACK SURGERY     BREAST BIOPSY Left 2003   BREAST CAPSULECTOMY WITH IMPLANT EXCHANGE Left 07/31/2016   Procedure: LEFT BREAST TOTAL CAPSULECTOMY REMOVE IMPLANT WITH DELAYED RECONSTRUCTION WITH PLACMENT WITH SILICONE GEL IMPLANTS;  Surgeon: DCrissie Reese MD;  [facility]  Service: Plastics;  Laterality: Left;   BREAST RECONSTRUCTION Left 07/31/2016   BREAST TOTAL CAPSULECTOMY REMOVE IMPLANT WITH DELAYED RECONSTRUCTION WITH PLACMENT WITH SILICONE GEL IMPLANTS    LUMBAR MICRODISCECTOMY  ~ 1990   MASTECTOMY Left 2003   MASTOPEXY Right 07/31/2016   MASTOPEXY Right 07/31/2016   Procedure: RIGHT MASTOPEXY;  Surgeon: DCrissie Reese MD;  [facility]  Service: Plastics;  Laterality: Right;   NASAL SINUS SURGERY  1990s   RIGHT/LEFT HEART CATH AND CORONARY ANGIOGRAPHY N/A 11/12/2021   Procedure: RIGHT/LEFT HEART CATH AND CORONARY ANGIOGRAPHY;  Surgeon: HLeonie Man MD;  [facility];  Service: Cardiovascular;  Laterality: N/A;   THORACIC AORTIC ANEURYSM REPAIR  03/06/2015   TONSILLECTOMY     TUBAL LIGATION     Social History:  reports that she quit smoking about 19 years ago. Her smoking use included cigarettes. She has a 22.50 pack-year smoking history. She has never used smokeless tobacco. She reports current alcohol use. She reports that she does not use drugs.  No Known Allergies  Family History  Problem Relation Age of Onset   Heart failure Mother     Prior to Admission medications   Medication Sig Start Date End Date Taking? Authorizing Provider  aspirin 81 MG chewable tablet Chew 81 mg by mouth daily.    [provider]   carvedilol (COREG) 12.5 MG tablet Take 1 tablet (12.5 mg total) by mouth 2 (two) times daily with a meal. 12/12/21   Bhagat, Bhavinkumar, PA  empagliflozin (JARDIANCE) 10 MG TABS tablet Take 1 tablet (10 mg total) by mouth daily before breakfast. 12/12/21   Bhagat, Bhavinkumar, PA  fluticasone (FLONASE) 50 MCG/ACT nasal spray Place 2 sprays into both nostrils daily as needed for allergies or rhinitis.    [provider]  furosemide (LASIX) 20 MG tablet Take 1 tablet (20 mg total) by mouth daily as needed for fluid or edema (shortness of breath). 04/02/22   Chandrasekhar, Terisa Starr, MD  Multiple Vitamins-Minerals (CENTRUM SILVER 50+WOMEN PO) Take 1 tablet by mouth daily.    [provider]  rosuvastatin (CRESTOR) 20 MG tablet Take 1 tablet (20 mg total) by mouth daily. 12/12/21   Bhagat, Bhavinkumar, PA  sacubitril-valsartan (ENTRESTO) 49-51 MG Take 1 tablet by mouth 2 (two) times daily. 02/26/22   Werner Lean, MD    Physical Exam: Vitals:   07/05/22 2149 07/05/22 2153 07/06/22 0015 07/06/22 0045  BP:  (!) 178/89 (!) 175/92 (!) 183/95  Pulse:  66 63 65  Resp:  (!) 22 (!) 22 (!) 22  Temp:  97.9 F (36.6 C)    TempSrc:  Oral    SpO2:  95% 94% 96%  Weight: 64.4 kg     Height: 5' 4"$  (1.626 m)      Constitutional: NAD, calm, comfortable, nontoxic appearing elderly female appearing younger than stated age sitting upright in bed Eyes: lids and conjunctivae normal ENMT: Mucous membranes are moist.  Neck: normal, supple Respiratory:right basilar crackle but no wheezing. Normal respiratory effort at rest but has increase labored respiration when speaking. No accessory muscle use.  Cardiovascular: Regular rate and 123456 systolic heart murmur. No extremity edema.  Abdomen: no tenderness,soft. Bowel sounds positive.  Musculoskeletal: no clubbing / cyanosis. No joint deformity upper and lower extremities.  Normal muscle tone.  Skin: no rashes, lesions, ulcers.   Neurologic: CN 2-12 grossly intact. Psychiatric: Normal judgment and insight. Alert and oriented x 3. Normal mood. Data Reviewed:  See HPI   Assessment and Plan: * Acute on chronic combined systolic and diastolic CHF (congestive heart failure) ([place]) -Presented with dyspnea/orthopnea with CXR findings of pulmonary edema and BNP >4500 -Last echo 04/2022 with EF 40-45% with grade II diastolic dysfunction. No valvular abnormalities. Aortic root dilatation up to 42m.  Not on diuretic at home due to labile renal function. Although may need PRN going forward.  -will continue IV 476mLasix here and follow intake and output, daily weights  AKI (acute kidney injury) ([place]-Possible AKI. Creatinine elevated at 1.39 from prior of 1.27 but has labile renal function in the past without clear baseline -will follow repeat in the morning with diuretics. Likely elevated from vascular congestion.   Elevated troponin -elevated at 121 but no chest pain. Will follow trend with a  second troponin but most likely demand ischemia from CHF exacerbation.   Hypertension -Mildly elevated. continue home Coreg and Entresto.   Diabetes mellitus ([place]) -Last A1C of 5.7 (10/2021) -no hyperglycemia today. Continue to follow with morning labs.      Advance Care Planning: Full  Consults: none  Family Communication: daughter at bedside  Severity of Illness: The appropriate patient status for this patient is INPATIENT. Inpatient status is judged to be reasonable and necessary in order to provide the required intensity of service to ensure the patient's safety. The patient's presenting symptoms, physical exam findings, and initial radiographic and laboratory data in the context of their chronic comorbidities is felt to place them at high risk for further clinical deterioration. Furthermore, it is not anticipated that the patient will be medically stable for discharge from the hospital within 2 midnights of admission.    * I certify that at the point of admission it is my clinical judgment that the patient will require inpatient hospital care spanning beyond 2 midnights from the point of admission due to high intensity of service, high risk for further deterioration and high frequency of surveillance required.*  Author: Orene Desanctis, DO 07/06/2022 1:36 AM  For on call review [email or web address].

## 2022-07-06 NOTE — Assessment & Plan Note (Addendum)
Uncontrolled hypertension with hypertensive emergency. Plan to continue carvedilol and entresto. Add Bidil for after load reduction.

## 2022-07-06 NOTE — Progress Notes (Signed)
Mobility Specialist Progress Note:   07/06/22 0845  Mobility  Activity Ambulated with assistance in hallway  Level of Assistance Standby assist, set-up cues, supervision of patient - no hands on  Assistive Device None  Distance Ambulated (ft) 190 ft  Activity Response Tolerated well  $Mobility charge 1 Mobility   Pre- Mobility: 97% SpO2(RA) During Mobility: 95% SpO2 (RA) Post Mobility:  96% SpO2 (RA)  Pt in bed willing to participate in mobility. No complaints of pain. Left in bed with call bell in reach and all needs met.   Gareth Eagle Mykeal Carrick Mobility Specialist Please contact via [employer] or  Rehab Office at [phone]

## 2022-07-07 DIAGNOSIS — I5023 Acute on chronic systolic (congestive) heart failure: Secondary | ICD-10-CM

## 2022-07-07 DIAGNOSIS — E1169 Type 2 diabetes mellitus with other specified complication: Secondary | ICD-10-CM | POA: Diagnosis not present

## 2022-07-07 DIAGNOSIS — N179 Acute kidney failure, unspecified: Secondary | ICD-10-CM | POA: Diagnosis not present

## 2022-07-07 DIAGNOSIS — I10 Essential (primary) hypertension: Secondary | ICD-10-CM | POA: Diagnosis not present

## 2022-07-07 LAB — BASIC METABOLIC PANEL
Anion gap: 16 — ABNORMAL HIGH (ref 5–15)
BUN: 33 mg/dL — ABNORMAL HIGH (ref 8–23)
CO2: 26 mmol/L (ref 22–32)
Calcium: 9.3 mg/dL (ref 8.9–10.3)
Chloride: 95 mmol/L — ABNORMAL LOW (ref 98–111)
Creatinine, Ser: 1.55 mg/dL — ABNORMAL HIGH (ref 0.44–1.00)
GFR, Estimated: 33 mL/min — ABNORMAL LOW (ref >60)
Glucose, Bld: 170 mg/dL — ABNORMAL HIGH (ref 70–99)
Potassium: 3.2 mmol/L — ABNORMAL LOW (ref 3.5–5.1)
Sodium: 137 mmol/L (ref 135–145)

## 2022-07-07 LAB — TROPONIN I (HIGH SENSITIVITY): Troponin I (High Sensitivity): 63 ng/L — ABNORMAL HIGH (ref <18)

## 2022-07-07 LAB — MAGNESIUM: Magnesium: 2.1 mg/dL (ref 1.7–2.4)

## 2022-07-07 MED ORDER — POTASSIUM CHLORIDE CRYS ER 20 MEQ PO TBCR
40.0000 meq | EXTENDED_RELEASE_TABLET | Freq: Once | ORAL | Status: AC
  Administered 2022-07-07: 40 meq via ORAL
  Filled 2022-07-07: qty 2

## 2022-07-07 MED ORDER — SODIUM CHLORIDE 0.9 % IV SOLN
12.5000 mg | Freq: Four times a day (QID) | INTRAVENOUS | Status: DC | PRN
  Filled 2022-07-07: qty 0.5

## 2022-07-07 NOTE — Progress Notes (Addendum)
Patient admitted from home with CHF. Orders placed for heart failure consult placed. Patient ambulating independently..  TOC following.

## 2022-07-07 NOTE — Evaluation (Signed)
Occupational Therapy Evaluation Patient Details Name: Brittany Greene MRN: [id number] DOB: 07/26/1940 Today's Date: 07/07/2022   History of Present Illness Pt is 82 year old [facility] to [facility] on  07/05/22 with SOB due to acute on chronic systolic heart failure. 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   Clinical Impression   Pt reports independent at baseline with ADLs and functional mobility, lives with daughter who works during the day. Pt currently needing supervision-min A for ADLs, mod I for bed mobility, and supervision for transfers without AD. VSS throughout session. Pt presenting with impairments listed below, will follow acutely. Recommend HHOT at d/c.      Recommendations for follow up therapy are one component of a multi-disciplinary discharge planning process, led by the attending physician.  Recommendations may be updated based on patient status, additional functional criteria and insurance authorization.   Follow Up Recommendations  Home health OT     Assistance Recommended at Discharge Intermittent Supervision/Assistance  Patient can return home with the following A little help with bathing/dressing/bathroom;Assistance with cooking/housework;Assist for transportation;Help with stairs or ramp for entrance    Functional Status Assessment  Patient has had a recent decline in their functional status and demonstrates the ability to make significant improvements in function in a reasonable and predictable amount of time.  Equipment Recommendations  None recommended by OT    Recommendations for Other Services PT consult     Precautions / Restrictions Precautions Precautions: Fall Restrictions Weight Bearing Restrictions: No      Mobility Bed Mobility Overal bed mobility: Modified Independent                  Transfers Overall transfer level: Needs  assistance Equipment used: None Transfers: Sit to/from Stand Sit to Stand: Supervision                  Balance Overall balance assessment: Needs assistance Sitting-balance support: Feet supported, No upper extremity supported Sitting balance-Leahy Scale: Good     Standing balance support: During functional activity Standing balance-Leahy Scale: Fair                             ADL either performed or assessed with clinical judgement   ADL Overall ADL's : Needs assistance/impaired Eating/Feeding: Modified independent   Grooming: Wash/dry hands;Standing;Supervision/safety   Upper Body Bathing: Minimal assistance   Lower Body Bathing: Minimal assistance   Upper Body Dressing : Supervision/safety   Lower Body Dressing: Supervision/safety   Toilet Transfer: Supervision/safety;Ambulation;Regular Museum/gallery exhibitions officer and Hygiene: Supervision/safety       Functional mobility during ADLs: Supervision/safety       Vision   Vision Assessment?: No apparent visual deficits     Perception Perception Perception Tested?: No   Praxis Praxis Praxis tested?: Not tested    Pertinent Vitals/Pain Pain Assessment Pain Assessment: No/denies pain     Hand Dominance Right   Extremity/Trunk Assessment Upper Extremity Assessment Upper Extremity Assessment: Generalized weakness   Lower Extremity Assessment Lower Extremity Assessment: Defer to PT evaluation LLE Deficits / Details: recent injection into hip for arthritis.   Cervical / Trunk Assessment Cervical / Trunk Assessment: Normal   Communication Communication Communication: No difficulties   Cognition Arousal/Alertness: Awake/alert Behavior During Therapy: WFL for tasks assessed/performed Overall Cognitive Status: Within Functional Limits for tasks assessed  General Comments  VSS on RA    Exercises     Shoulder  Instructions      Home Living Family/patient expects to be discharged to:: Private residence Living Arrangements: Children (daughter) Available Help at Discharge: Available PRN/intermittently;Family Type of Home: House Home Access: Stairs to enter [employer] of Steps: 1 Entrance Stairs-Rails: None Home Layout: Two level;Able to live on main level with bedroom/bathroom     Bathroom Shower/Tub: Occupational psychologist: Standard Bathroom Accessibility: Yes   Home Equipment: Cane - single point;Rollator (4 wheels)          Prior Functioning/Environment Prior Level of Function : Independent/Modified Independent;Driving             Mobility Comments: likes to shop and go on cruises, uses SPC as needed when arthritis acts up ADLs Comments: independent        OT Problem List: Decreased strength;Decreased range of motion;Decreased activity tolerance      OT Treatment/Interventions: Self-care/ADL training;Therapeutic exercise;Energy conservation;DME and/or AE instruction;Therapeutic activities;Patient/family education;Balance training    OT Goals(Current goals can be found in the care plan section) Acute Rehab OT Goals Patient Stated Goal: none stated OT Goal Formulation: With patient Time For Goal Achievement: 07/21/22 Potential to Achieve Goals: Good ADL Goals Pt Will Perform Lower Body Dressing: Independently;sitting/lateral leans;sit to/from stand Pt Will Perform Tub/Shower Transfer: Shower transfer;Independently;ambulating Additional ADL Goal #1: pt will be able to stand x10 min for functional task in order to improve activity tolerance for ADLs  OT Frequency: Min 2X/week    Co-evaluation              AM-PAC OT "6 Clicks" Daily Activity     Outcome Measure Help from another person eating meals?: None Help from another person taking care of personal grooming?: None Help from another person toileting, which includes using toliet, bedpan,  or urinal?: None Help from another person bathing (including washing, rinsing, drying)?: A Little Help from another person to put on and taking off regular upper body clothing?: None Help from another person to put on and taking off regular lower body clothing?: A Little 6 Click Score: 22   End of Session Nurse Communication: Mobility status  Activity Tolerance: Patient tolerated treatment well Patient left: in bed;with call bell/phone within reach;with bed alarm set;with family/visitor present  OT Visit Diagnosis: Unsteadiness on feet (R26.81);Other abnormalities of gait and mobility (R26.89);Muscle weakness (generalized) (M62.81)                Time: [id number] OT Time Calculation (min): 16 min Charges:  OT General Charges $OT Visit: 1 Visit OT Evaluation $OT Eval Moderate Complexity: 1 Mod  Jaysion Ramseyer K, OTD, OTR/L SecureChat Preferred Acute Rehab [phone]  Renaye Rakers Koonce 07/07/2022, 2:20 PM

## 2022-07-07 NOTE — Progress Notes (Signed)
Heart Failure Navigator Progress Note  Assessed for Heart & Vascular TOC clinic readiness.  Patient EF 40-45%, Has [facility] appointment scheduled for 07/22/2022.   Navigator available for reassessment of patient.   Brittany Greene, BSN, Clinical cytogeneticist Only

## 2022-07-07 NOTE — Plan of Care (Signed)

## 2022-07-07 NOTE — Progress Notes (Signed)
Progress Note   PatientTaviana Okolo [id number] DOB: 05/14/1941 DOA: 07/05/2022     1 DOS: the patient was seen and examined on 07/07/2022   Brief hospital course: Mrs. Sayre was admitted to the hospital with the working diagnosis of heart failure exacerbation.   82 yo female with the past medical history of coronary artery disease, heart failure, PAD sp L carotid subclavian bypass and TEVAR, AAA repair, hypertension, CKD and Type 2DM who presented with dyspnea. Patient reported being off diuretics for one month due to reduced GFR. She developed progressive dyspnea and orthopnea, that prompted her to come to the Ed. On her initial physical examination her blood pressure was 178/89, HR 66, RR 22 and 02 saturation 95%, lungs with right rales with no wheezing, heart with S1 and S2  present and rhythmic with positive systolic murmur, abdomen with no distention, no lower extremity edema.   Na 139, K 3,6 Cl 102 bicarbonate 26 glcose 141 bun 24 cr 1,39  BNP >4,500 High sensitive troponin 121 and 131  Wbc 9,6 hgb 9,5 plt 148  Sars covid 19 negative   Chest radiograph with cardiomegaly, with bilateral hilar vascular congestion, bilateral interstitial infiltrates, with cephalization of the vasculature. Fluid in the right fissure.  Sternotomy wires in place, and aortic endovascular stent present.   02/12 volume status is improving, plan to follow up renal function prior to discharge.     Assessment and Plan: * Acute on chronic systolic CHF (congestive heart failure) ([place]) 04/2022 Echocardiogram with reduced LV systolic function EF 40 to 45%, with global hypokinesis, RV systolic function preserved, LA and RA with severe dilatation, moderate TR   Documented urine output  123456 ml Systolic blood pressure 123456 to 107 mmHg.   Continue with entresto, empagliflozin and carvedilol.   Bidil for after load reduction Hold on spironolactone until renal function more stable.     Hypertension Uncontrolled hypertension with hypertensive emergency. Blood pressure has improved plan to contineu wit carvedilol, entresto and Bidil.   AKI (acute kidney injury) (HCC) CKD stage 3a (base serum cr 1,2). Hypokalemia   Volume status has improved, renal function today with serum cr at 1,5 with K at 3,2 and serum bicarbonate at 26.   Add 20 Kcl po x1  Hold on spironolactone for now.  Follow up renal function and electrolytes in am.  Type 2 diabetes mellitus with hyperlipidemia (HCC) Fasting glucose is 170 Continue glucose monitoring as needed.  Hold on insulin for now.    Continue with statin therapy.        Subjective: Patient is feeling better, dyspnea has resolved, no chest pain, no edema   Physical Exam: Vitals:   07/07/22 0300 07/07/22 0331 07/07/22 0500 07/07/22 0719  BP:  107/62  104/61  Pulse: 68 74    Resp: 19 20  16  $ Temp:  98.1 F (36.7 C)  97.9 F (36.6 C)  TempSrc:  Oral  Oral  SpO2: 92% 92%    Weight:   60.8 kg   Height:       Neurology awake and alert ENT with mild pallor Cardiovascular with S1 and S2 present, regular with no gallops, rubs or murmurs Respiratory with no rales or wheezing, no rhonchi Abdomen with no distention No lower extremity edema  Data Reviewed:    Family Communication: no family at the bedside   Disposition: Status is: Inpatient Remains inpatient appropriate because: pending renal function to be more stable, possible discharge tomorrow   Planned Discharge Destination:  Home    Author: Tawni Millers, MD 07/07/2022 9:24 AM  For on call review [email or web address].

## 2022-07-07 NOTE — Progress Notes (Incomplete)
   Heart Failure Stewardship Pharmacist Progress Note   PCP: Everardo Beals, NP PCP-Cardiologist: Werner Lean, MD    HPI:  ***  Current HF Medications: {HF MEDS DEYCXKG:81856}  Prior to admission HF Medications: {HF MEDS PTA:26664}  Pertinent Lab Values: Serum creatinine ***, BUN ***, Potassium ***, Sodium ***, BNP ***, Magnesium ***, A1c ***, Digoxin ***   Vital Signs: Weight: *** lbs (admission weight: *** lbs) Blood pressure: ***  Heart rate: ***  I/O: -***L yesterday; net -***L  Medication Assistance / Insurance Benefits Check: Does the patient have prescription insurance?  {Yes/No/Pending:24180} Type of insurance plan: ***  Does the patient qualify for medication assistance through manufacturers or grants?   {Yes/No/Pending:24180} Eligible grants and/or patient assistance programs: *** Medication assistance applications in progress: ***  Medication assistance applications approved: *** Approved medication assistance renewals will be completed by: ***  Outpatient Pharmacy:  Prior to admission outpatient pharmacy: *** Is the patient willing to use [facility] at discharge? {Yes/No/Pending:24180} Is the patient willing to transition their outpatient pharmacy to utilize a [facility] outpatient pharmacy?   {Yes/No/Pending:24180}    Assessment: 1. Acute on ***chronic ***systolic CHF (LVEF ***%), due to ***. NYHA class *** symptoms. -    Plan: 1) Medication changes recommended at this time: -  2) Patient assistance: -  3)  Education  - To be completed prior to discharge  Kerby Nora, PharmD, BCPS Heart Failure Stewardship Pharmacist Phone [phone]

## 2022-07-07 NOTE — Evaluation (Addendum)
Physical Therapy Evaluation Patient Details Name: Brittany Greene MRN: [id number] DOB: 1940/08/21 Today's Date: 07/07/2022  History of Present Illness  Pt is 82 year old presented to [facility] on  07/05/22 with SOB due to acute on chronic systolic heart failure. 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  Clinical Impression  Patient presents with generalized weakness and mild balance deficits s/p above. Pt lives at home with her daughter and reports being independent for ADLs/IADLS at baseline, using DME when arthritis acts up. Today, pt tolerated bed mobility, transfers and gait training with Mod I-Min guard assist for safety. Noted to have mild drifting and balance deficits with mobility but no overt LOB. Reports she has family support at home as needed. Encouraged use of rollator initially to decrease fall risk until strength/balance improve and pt agreeable. Encouraged walking in the room and as much as tolerated to improve function. Will follow acutely to maximize independence and mobility prior to return home.    Addendum: Per OT, after discussion with family, daughter is interested in HHPT for patient at d/c. Will update recommendations to HHPT.     Recommendations for follow up therapy are one component of a multi-disciplinary discharge planning process, led by the attending physician.  Recommendations may be updated based on patient status, additional functional criteria and insurance authorization.  Follow Up Recommendations Home health PT      Assistance Recommended at Discharge PRN  Patient can return home with the following  A little help with walking and/or transfers;A little help with bathing/dressing/bathroom;Assist for transportation;Assistance with cooking/housework    Equipment Recommendations None recommended by PT  Recommendations for Other Services       Functional  Status Assessment Patient has had a recent decline in their functional status and demonstrates the ability to make significant improvements in function in a reasonable and predictable amount of time.     Precautions / Restrictions Precautions Precautions: Fall Restrictions Weight Bearing Restrictions: No      Mobility  Bed Mobility Overal bed mobility: Modified Independent             General bed mobility comments: No assist needed, no use of rails.    Transfers Overall transfer level: Needs assistance Equipment used: None Transfers: Sit to/from Stand Sit to Stand: Supervision           General transfer comment: Supervision for safety. Stood from [employer], from toilet x1    Ambulation/Gait Ambulation/Gait assistance: Counsellor (Feet): 150 Feet Assistive device: None Gait Pattern/deviations: Step-through pattern, Decreased stride length, Drifts right/left Gait velocity: decreased Gait velocity interpretation: <1.31 ft/sec, indicative of household ambulator   General Gait Details: Slow, milldly unsteady gait with mild drifting noted esp with head turns but no overt LOB. Would benefit from using rollator. VSS.  Stairs            Wheelchair Mobility    Modified Rankin (Stroke Patients Only)       Balance Overall balance assessment: Needs assistance Sitting-balance support: Feet supported, No upper extremity supported Sitting balance-Leahy Scale: Good Sitting balance - Comments: Able to perform pericare without assist   Standing balance support: During functional activity Standing balance-Leahy Scale: Fair                               Pertinent Vitals/Pain Pain Assessment  Pain Assessment: No/denies pain    Home Living Family/patient expects to be discharged to:: Private residence Living Arrangements: Children (daughter) Available Help at Discharge: Available PRN/intermittently;Family Type of Home: House Home Access:  Stairs to enter Entrance Stairs-Rails: None Entrance Stairs-Number of Steps: 1   Home Layout: Two level;Able to live on main level with bedroom/bathroom Home Equipment: Cane - single point;Rollator (4 wheels)      Prior Function Prior Level of Function : Independent/Modified Independent;Driving             Mobility Comments: likes to shop and go on cruises, uses SPC as needed when arthritis acts up ADLs Comments: independent     Hand Dominance   Dominant Hand: Right    Extremity/Trunk Assessment   Upper Extremity Assessment Upper Extremity Assessment: Generalized weakness    Lower Extremity Assessment Lower Extremity Assessment: Defer to PT evaluation LLE Deficits / Details: recent injection into hip for arthritis.    Cervical / Trunk Assessment Cervical / Trunk Assessment: Normal  Communication   Communication: No difficulties  Cognition Arousal/Alertness: Awake/alert Behavior During Therapy: WFL for tasks assessed/performed Overall Cognitive Status: Within Functional Limits for tasks assessed                                          General Comments General comments (skin integrity, edema, etc.): VSS on RA    Exercises     Assessment/Plan    PT Assessment Patient needs continued PT services  PT Problem List Decreased strength;Decreased mobility;Decreased balance       PT Treatment Interventions Therapeutic activities;Gait training;Therapeutic exercise;Balance training;Patient/family education;Functional mobility training    PT Goals (Current goals can be found in the Care Plan section)  Acute Rehab PT Goals Patient Stated Goal: to go home, and go on a cruise with her family PT Goal Formulation: With patient Time For Goal Achievement: 07/21/22 Potential to Achieve Goals: Good    Frequency Min 3X/week     Co-evaluation               AM-PAC PT "6 Clicks" Mobility  Outcome Measure Help needed turning from your back to your  side while in a flat bed without using bedrails?: None Help needed moving from lying on your back to sitting on the side of a flat bed without using bedrails?: None Help needed moving to and from a bed to a chair (including a wheelchair)?: A Little Help needed standing up from a chair using your arms (e.g., wheelchair or bedside chair)?: A Little Help needed to walk in hospital room?: A Little Help needed climbing 3-5 steps with a railing? : A Little 6 Click Score: 20    End of Session Equipment Utilized During Treatment: Gait belt Activity Tolerance: Patient tolerated treatment well Patient left: in bed;with call bell/phone within reach (sitting EOB) Nurse Communication: Mobility status;Other (comment) (wants to wash up - tech made aware) PT Visit Diagnosis: Unsteadiness on feet (R26.81);Muscle weakness (generalized) (M62.81)    Time: [id number] PT Time Calculation (min) (ACUTE ONLY): 17 min   Charges:   PT Evaluation $PT Eval Moderate Complexity: 1 Mod          Marisa Severin, PT, DPT Acute Rehabilitation Services Secure chat preferred Office [phone]     Marguarite Arbour A Sabra Heck 07/07/2022, 2:44 PM

## 2022-07-08 DIAGNOSIS — I5023 Acute on chronic systolic (congestive) heart failure: Secondary | ICD-10-CM | POA: Diagnosis not present

## 2022-07-08 DIAGNOSIS — N179 Acute kidney failure, unspecified: Secondary | ICD-10-CM | POA: Diagnosis not present

## 2022-07-08 DIAGNOSIS — I10 Essential (primary) hypertension: Secondary | ICD-10-CM | POA: Diagnosis not present

## 2022-07-08 LAB — BASIC METABOLIC PANEL
Anion gap: 13 (ref 5–15)
BUN: 41 mg/dL — ABNORMAL HIGH (ref 8–23)
CO2: 27 mmol/L (ref 22–32)
Calcium: 8.7 mg/dL — ABNORMAL LOW (ref 8.9–10.3)
Chloride: 95 mmol/L — ABNORMAL LOW (ref 98–111)
Creatinine, Ser: 1.89 mg/dL — ABNORMAL HIGH (ref 0.44–1.00)
GFR, Estimated: 26 mL/min — ABNORMAL LOW (ref >60)
Glucose, Bld: 147 mg/dL — ABNORMAL HIGH (ref 70–99)
Potassium: 3.9 mmol/L (ref 3.5–5.1)
Sodium: 135 mmol/L (ref 135–145)

## 2022-07-08 LAB — MAGNESIUM: Magnesium: 2.2 mg/dL (ref 1.7–2.4)

## 2022-07-08 NOTE — Progress Notes (Signed)
Mobility Specialist - Progress Note   07/08/22 1458  Mobility  Activity Ambulated independently in hallway  Level of Assistance Modified independent, requires aide device or extra time  Assistive Device Front wheel walker  Distance Ambulated (ft) 500 ft  Activity Response Tolerated well  Mobility Referral Yes  $Mobility charge 1 Mobility    Pt received in bed agreeable to mobility. Used BR prior to hallway ambulation. No complaints throughout, tolerated distance well without any rest breaks. Left sitting EOB w/ call bell within reach and all needs met.   [place] Specialist Please contact via SecureChat or Rehab office at [phone]

## 2022-07-08 NOTE — Progress Notes (Signed)
Progress Note   PatientGlee Greene [id number] DOB: Sep 08, 1940 DOA: 07/05/2022     2 DOS: the patient was seen and examined on 07/08/2022   Brief hospital course: Mrs. Brittany Greene was admitted to the hospital with the working diagnosis of heart failure exacerbation.   82 yo female with the past medical history of coronary artery disease, heart failure, PAD sp L carotid subclavian bypass and TEVAR, AAA repair, hypertension, CKD and Type 2DM who presented with dyspnea. Patient reported being off diuretics for one month due to reduced GFR. She developed progressive dyspnea and orthopnea, that prompted her to come to the Ed. On her initial physical examination her blood pressure was 178/89, HR 66, RR 22 and 02 saturation 95%, lungs with right rales with no wheezing, heart with S1 and S2  present and rhythmic with positive systolic murmur, abdomen with no distention, no lower extremity edema.   Na 139, K 3,6 Cl 102 bicarbonate 26 glcose 141 bun 24 cr 1,39  BNP >4,500 High sensitive troponin 121 and 131  Wbc 9,6 hgb 9,5 plt 148  Sars covid 19 negative   Chest radiograph with cardiomegaly, with bilateral hilar vascular congestion, bilateral interstitial infiltrates, with cephalization of the vasculature. Fluid in the right fissure.  Sternotomy wires in place, and aortic endovascular stent present.   02/12 volume status is improving, plan to follow up renal function prior to discharge.  02/13 volume status has improved, pending renal function to stabilize in order to discharge patient home.    Assessment and Plan: * Acute on chronic systolic CHF (congestive heart failure) ([place]) 04/2022 Echocardiogram with reduced LV systolic function EF 40 to 45%, with global hypokinesis, RV systolic function preserved, LA and RA with severe dilatation, moderate TR   Documented urine output  documented [id number] ml Systolic blood pressure [id number] to 101 mmHg, has been as low as 88 yesterday.   Continue to hold  antihypertensive medications.  Continue with carvedilol and empagliflozin.    Hypertension Positive hypotension and worsening renal function per serum cr.  Continue coreg, hold other blood pressure agents for now.   AKI (acute kidney injury) (HCC) CKD stage 3a (base serum cr 1,2). Hypokalemia   Renal function with serum cr at 1,89 with K ata 3,9 and serum bicarbonate at 27. Mg 2,2 and Na 135.   Plan to continue close monitoring of renal function, avoid hypotension and nephrotoxic medications.   Type 2 diabetes mellitus with hyperlipidemia (HCC) Fasting glucose is 147 Continue glucose monitoring as needed.  Hold on insulin for now.    Continue with statin therapy.        Subjective: Patient with no chest pain or dyspnea, edema continue to improve.   Physical Exam: Vitals:   07/08/22 0306 07/08/22 0722 07/08/22 0854 07/08/22 1507  BP: 112/69 138/76 114/60 101/63  Pulse: 77 74 76   Resp: 15 10  20  $ Temp: 98 F (36.7 C) 98.3 F (36.8 C)  97.8 F (36.6 C)  TempSrc: Oral Oral  Oral  SpO2: 95% 93%    Weight: 61.7 kg     Height:       Neurology awake and alert ENT with no pallor Cardiovascular with S1 and S2 present and rhythmic with no gallops, rubs or murmurs No JVD No lower extremity edema Respiratory with no rales or wheezing Abdomen with no distention  Data Reviewed:    Family Communication: no family at the bedside   Disposition: Status is: Inpatient Remains inpatient appropriate because: pending renal  function to improve   Planned Discharge Destination: Home      Author: Tawni Millers, MD 07/08/2022 3:48 PM  For on call review [email or web address].

## 2022-07-08 NOTE — Progress Notes (Signed)
Physical Therapy Treatment Patient Details Name: Brittany Greene MRN: [id number] DOB: 1940-08-10 Today's Date: 07/08/2022   History of Present Illness Pt is 82 year old presented to [facility] on  07/05/22 with SOB due to acute on chronic systolic heart failure. 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    PT Comments    Patient progressing well towards PT goals. Session focused on progressive ambulation and safe mobility. Utilized rollator for ambulation this session and this improved distance as well as balance with activity. No rest breaks needed. VSS on RA. Able to walk short distances without UE support but recommend use of DME for community ambulation/longer distances for safety. Safe to d/c from a mobility stand point as pt functioning at [place] I level. All education completed. Goals met. Discharge from therapy.    Recommendations for follow up therapy are one component of a multi-disciplinary discharge planning process, led by the attending physician.  Recommendations may be updated based on patient status, additional functional criteria and insurance authorization.  Follow Up Recommendations  Home health PT     Assistance Recommended at Discharge PRN  Patient can return home with the following A little help with walking and/or transfers;A little help with bathing/dressing/bathroom;Assist for transportation;Assistance with cooking/housework   Equipment Recommendations  None recommended by PT    Recommendations for Other Services       Precautions / Restrictions Precautions Precautions: Fall Restrictions Weight Bearing Restrictions: No     Mobility  Bed Mobility Overal bed mobility: Modified Independent             General bed mobility comments: No assist needed, no use of rails.    Transfers Overall transfer level: Needs assistance Equipment used:  Rollator (4 wheels), None Transfers: Sit to/from Stand Sit to Stand: Supervision           General transfer comment: Supervision for safety. Stood from [employer], from toilet x1    Ambulation/Gait Ambulation/Gait assistance: Supervision, Modified independent (Device/Increase time) Gait Distance (Feet): 400 Feet Assistive device: Rollator (4 wheels), None Gait Pattern/deviations: Step-through pattern, Decreased stride length   Gait velocity interpretation: 1.31 - 2.62 ft/sec, indicative of limited community ambulator   General Gait Details: Slow, steady gait with use of rollator for support, 2/4 DOE. No rest breaks needed. Able to walk short distances in room wihtout UE support. VSS.   Stairs             Wheelchair Mobility    Modified Rankin (Stroke Patients Only)       Balance Overall balance assessment: Needs assistance Sitting-balance support: Feet supported, No upper extremity supported Sitting balance-Leahy Scale: Good Sitting balance - Comments: Able to perform pericare without assist   Standing balance support: During functional activity Standing balance-Leahy Scale: Fair                              Cognition Arousal/Alertness: Awake/alert Behavior During Therapy: WFL for tasks assessed/performed Overall Cognitive Status: Within Functional Limits for tasks assessed                                          Exercises      General Comments General comments (skin integrity, edema, etc.): VSS on RA.  Pertinent Vitals/Pain Pain Assessment Pain Assessment: No/denies pain    Home Living                          Prior Function            PT Goals (current goals can now be found in the care plan section) Progress towards PT goals: Progressing toward goals    Frequency    Min 3X/week      PT Plan Current plan remains appropriate    Co-evaluation              AM-PAC PT "6 Clicks" Mobility    Outcome Measure  Help needed turning from your back to your side while in a flat bed without using bedrails?: None Help needed moving from lying on your back to sitting on the side of a flat bed without using bedrails?: None Help needed moving to and from a bed to a chair (including a wheelchair)?: A Little Help needed standing up from a chair using your arms (e.g., wheelchair or bedside chair)?: A Little Help needed to walk in hospital room?: A Little Help needed climbing 3-5 steps with a railing? : A Little 6 Click Score: 20    End of Session Equipment Utilized During Treatment: Gait belt Activity Tolerance: Patient tolerated treatment well Patient left: in bed;with call bell/phone within reach (sitting EOB) Nurse Communication: Mobility status PT Visit Diagnosis: Unsteadiness on feet (R26.81);Muscle weakness (generalized) (M62.81)     Time: [id number] PT Time Calculation (min) (ACUTE ONLY): 20 min  Charges:  $Gait Training: 8-22 mins                     Marisa Severin, PT, DPT Acute Rehabilitation Services Secure chat preferred Office [place] 07/08/2022, 11:03 AM

## 2022-07-08 NOTE — Progress Notes (Signed)
PROGRESS NOTE    Brittany Greene  [id number] DOB: April 04, 1941 DOA: 07/05/2022 PCP: Everardo Beals, NP  81/F w/ CAD, chronic systolic CHF, EF [id number], PAD sp L carotid subclavian bypass and TEVAR, AAA repair, hypertension, CKD and Type 2DM who presented with dyspnea. Patient reported being off diuretics for one month due to reduced GFR. She developed progressive dyspnea and orthopnea. In ED, cr 1,39, BNP >4,500, troponin 121 and 131, CXR w/cardiomegaly, with bilateral hilar vascular congestion, bilateral interstitial infiltrates, Sternotomy wires in place, and aortic endovascular stent present.    02/12 volume status improving, creat trending up 02/13 volume status has improved, pending stabilization of kidney fxn prior to discharge  -2/14 creat improving  Subjective: Feels better, no dizziness  Assessment and Plan:  Acute on chronic systolic CHF (congestive heart failure) ([place]) 04/2022 Echo EF 40 to 45%, with global hypokinesis, RV systolic function preserved, LA and RA with severe dilatation, moderate TR  -diuresed w/ IV lasix, 3.8L negative, wt down 6lbs -then complicated by AKI, diuretics held -Continue carvedilol and empagliflozin.  -restart po lasix today  Hypertension Then developed hypotension and worsening renal function per serum cr.  Continue coreg, hold other blood pressure agents for now.  -restarting lasix today  AKI on CKD3a Hypokalemia -baseline creat 1.2 -creat trending up to 1.89, held diuretics, avoid hypotension, holding BP meds -creat better, restarting po lasix today  Type 2 diabetes mellitus with hyperlipidemia (HCC) -stable, holding insulin  DVT prophylaxis: lovenox Code Status: Full code Family Communication: no fam at bedside Disposition Plan: Home tomorrow if stable  Consultants:    Procedures:   Antimicrobials:    Objective: Vitals:   07/08/22 0854 07/08/22 1507 07/08/22 1802 07/08/22 1947  BP: 114/60 101/63 104/61 117/69  Pulse: 76   70 66  Resp:  20  20  Temp:  97.8 F (36.6 C)  98.6 F (37 C)  TempSrc:  Oral  Oral  SpO2:    98%  Weight:      Height:        Intake/Output Summary (Last 24 hours) at 07/08/2022 2019 Last data filed at 07/08/2022 1327 Gross per 24 hour  Intake 598 ml  Output 350 ml  Net 248 ml   Filed Weights   07/05/22 2149 07/07/22 0500 07/08/22 0306  Weight: 64.4 kg 60.8 kg 61.7 kg    Examination:  General exam: Appears calm and comfortable  Respiratory system: Clear to auscultation Cardiovascular system: S1 & S2 heard, RRR.  Abd: nondistended, soft and nontender.Normal bowel sounds heard. Central nervous system: Alert and oriented. No focal neurological deficits. Extremities: no edema Skin: No rashes Psychiatry:  Mood & affect appropriate.     Data Reviewed:   CBC: Recent Labs  Lab 07/05/22 2311 07/06/22 0547  WBC 9.6 9.4  NEUTROABS 7.7  --   HGB 9.5* 9.9*  HCT 30.6* 32.1*  MCV 82.5 81.9  PLT 148* [id number]*   Basic Metabolic Panel: Recent Labs  Lab 07/05/22 2311 07/06/22 0547 07/07/22 0025 07/08/22 0015  NA 139 141 137 135  K 3.6 3.8 3.2* 3.9  CL 102 101 95* 95*  CO2 26 24 26 27  $ GLUCOSE 141* 118* 170* 147*  BUN 24* 24* 33* 41*  CREATININE 1.39* 1.41* 1.55* 1.89*  CALCIUM 9.0 9.2 9.3 8.7*  MG  --   --  2.1 2.2   GFR: Estimated Creatinine Clearance: 19.8 mL/min (A) (by C-G formula based on SCr of 1.89 mg/dL (H)). Liver Function Tests: No results for input(s): "AST", "  ALT", "ALKPHOS", "BILITOT", "PROT", "ALBUMIN" in the last 168 hours. No results for input(s): "LIPASE", "AMYLASE" in the last 168 hours. No results for input(s): "AMMONIA" in the last 168 hours. Coagulation Profile: No results for input(s): "INR", "PROTIME" in the last 168 hours. Cardiac Enzymes: No results for input(s): "CKTOTAL", "CKMB", "CKMBINDEX", "TROPONINI" in the last 168 hours. BNP (last 3 results) Recent Labs    03/12/22 1319  PROBNP 1,647*   HbA1C: No results for input(s):  "HGBA1C" in the last 72 hours. CBG: No results for input(s): "GLUCAP" in the last 168 hours. Lipid Profile: No results for input(s): "CHOL", "HDL", "LDLCALC", "TRIG", "CHOLHDL", "LDLDIRECT" in the last 72 hours. Thyroid Function Tests: No results for input(s): "TSH", "T4TOTAL", "FREET4", "T3FREE", "THYROIDAB" in the last 72 hours. Anemia Panel: No results for input(s): "VITAMINB12", "FOLATE", "FERRITIN", "TIBC", "IRON", "RETICCTPCT" in the last 72 hours. Urine analysis:    Component Value Date/Time   COLORURINE YELLOW 12/01/2014 0210   APPEARANCEUR CLEAR 12/01/2014 0210   LABSPEC 1.041 (H) 12/01/2014 0210   PHURINE 7.0 12/01/2014 0210   GLUCOSEU NEGATIVE 12/01/2014 0210   HGBUR NEGATIVE 12/01/2014 0210   BILIRUBINUR NEGATIVE 12/01/2014 0210   KETONESUR NEGATIVE 12/01/2014 0210   PROTEINUR NEGATIVE 12/01/2014 0210   UROBILINOGEN 1.0 12/01/2014 0210   NITRITE NEGATIVE 12/01/2014 0210   LEUKOCYTESUR NEGATIVE 12/01/2014 0210   Sepsis Labs: @LABRCNTIP$ (procalcitonin:4,lacticidven:4)  ) Recent Results (from the past 240 hour(s))  Resp panel by RT-PCR (RSV, Flu A&B, Covid) Anterior Nasal Swab     Status: None   Collection Time: 07/05/22 10:12 PM   Specimen: Anterior Nasal Swab  Result Value Ref Range Status   SARS Coronavirus 2 by RT PCR NEGATIVE NEGATIVE Final   Influenza A by PCR NEGATIVE NEGATIVE Final   Influenza B by PCR NEGATIVE NEGATIVE Final    Comment: (NOTE) The Xpert Xpress SARS-CoV-2/FLU/RSV plus assay is intended as an aid in the diagnosis of influenza from Nasopharyngeal swab specimens and should not be used as a sole basis for treatment. Nasal washings and aspirates are unacceptable for Xpert Xpress SARS-CoV-2/FLU/RSV testing.  Fact Sheet for Patients: EntrepreneurPulse.com.au  Fact Sheet for Healthcare Providers: IncredibleEmployment.be  This test is not yet approved or cleared by the [place] FDA and has been  authorized for detection and/or diagnosis of SARS-CoV-2 by FDA under an Emergency Use Authorization (EUA). This EUA will remain in effect (meaning this test can be used) for the duration of the COVID-19 declaration under Section 564(b)(1) of the Act, 21 U.S.C. section 360bbb-3(b)(1), unless the authorization is terminated or revoked.     Resp Syncytial Virus by PCR NEGATIVE NEGATIVE Final    Comment: (NOTE) Fact Sheet for Patients: EntrepreneurPulse.com.au  Fact Sheet for Healthcare Providers: IncredibleEmployment.be  This test is not yet approved or cleared by the [place] FDA and has been authorized for detection and/or diagnosis of SARS-CoV-2 by FDA under an Emergency Use Authorization (EUA). This EUA will remain in effect (meaning this test can be used) for the duration of the COVID-19 declaration under Section 564(b)(1) of the Act, 21 U.S.C. section 360bbb-3(b)(1), unless the authorization is terminated or revoked.  Performed at [facility], [place] [address], [place], [place] [address]   MRSA Next Gen by PCR, Nasal     Status: None   Collection Time: 07/06/22  5:00 AM   Specimen: Nasal Mucosa; Nasal Swab  Result Value Ref Range Status   MRSA by PCR Next Gen NOT DETECTED NOT DETECTED Final  Comment: (NOTE) The GeneXpert MRSA Assay (FDA approved for NASAL specimens only), is one component of a comprehensive MRSA colonization surveillance program. It is not intended to diagnose MRSA infection nor to guide or monitor treatment for MRSA infections. Test performance is not FDA approved in patients less than 37 years old. Performed at [facility], [place] [address] [place], [place] [address]      Radiology Studies: No results found.   Scheduled Meds:  aspirin  81 mg Oral Daily   carvedilol  12.5 mg Oral BID WC   empagliflozin  10 mg Oral Daily   enoxaparin (LOVENOX) injection  30 mg Subcutaneous Daily   rosuvastatin   20 mg Oral Daily   Continuous Infusions:  promethazine (PHENERGAN) injection (IM or IVPB)       LOS: 2 days    Time spent: 29mn    PDomenic Polite MD [facility]   07/08/2022, 8:19 PM

## 2022-07-08 NOTE — TOC Initial Note (Addendum)
Transition of Care [facility]) - Initial/Assessment Note    Patient Details  Name: Brittany Greene MRN: [id number] Date of Birth: 01/13/1941  Transition of Care [facility]) CM/SW Contact:    Cyndi Bender, RN Phone Number: 07/08/2022, 1:12 PM  Clinical Narrative:                 Spoke to patient regarding transition needs.  Patient states she lives with her daughter.  Patient has used [facility] in the past and would like to use them again. Patient has a walker and cane. Patient's daughter and  pick her up after work when discharged.  Address, Phone number and PCP verified.  No other TOC needs at this time.   Needs home health PT/OT orders.   Expected Discharge Plan: [place] Barriers to Discharge: Continued Medical Work up   Patient Goals and CMS Choice Patient states their goals for this hospitalization and ongoing recovery are:: return home CMS Medicare.gov Compare Post Acute Care list provided to:: Patient Choice offered to / list presented to : Patient      Expected Discharge Plan and Services     Post Acute Care Choice: [place] arrangements for the past 2 months: Single Family Home                           HH Arranged: PT, OT HH Agency: [facility] Date [place]: 07/08/22 Time [place]: 80 Representative spoke with at [place]: Tommi Rumps  Prior Living Arrangements/Services Living arrangements for the past 2 months: [place] Lives with:: Adult Children Patient language and need for interpreter reviewed:: Yes Do you feel safe going back to the place where you live?: Yes      Need for Family Participation in Patient Care: Yes (Comment) Care giver support system in place?: Yes (comment) Current home services: DME (walker, cane) Criminal Activity/Legal Involvement Pertinent to Current Situation/Hospitalization: No - Comment as needed  Activities of Daily Living   ADL Screening (condition at time  of admission) Patient's cognitive ability adequate to safely complete daily activities?: Yes Is the patient deaf or have difficulty hearing?: No Does the patient have difficulty seeing, even when wearing glasses/contacts?: No Does the patient have difficulty concentrating, remembering, or making decisions?: No Patient able to express need for assistance with ADLs?: Yes Does the patient have difficulty dressing or bathing?: No Independently performs ADLs?: Yes (appropriate for developmental age) Does the patient have difficulty walking or climbing stairs?: Yes Weakness of Legs: Both Weakness of Arms/Hands: Both  Permission Sought/Granted Permission sought to share information with : Investment banker, corporate granted to share info w AGENCY: HH        Emotional Assessment Appearance:: Appears stated age Attitude/Demeanor/Rapport: Gracious Affect (typically observed): Accepting Orientation: : Oriented to Self, Oriented to Place, Oriented to  Time, Oriented to Situation Alcohol / Substance Use: Not Applicable Psych Involvement: No (comment)  Admission diagnosis:  Acute on chronic congestive heart failure (HCC) [I50.9] Acute on chronic congestive heart failure, unspecified heart failure type [facility]) [I50.9] Patient Active Problem List   Diagnosis Date Noted   Acute on chronic systolic CHF (congestive heart failure) ([place]) 07/06/2022   Elevated troponin 07/06/2022   Acute on chronic congestive heart failure ([place]) 07/06/2022   AKI (acute kidney injury) ([place]) 07/06/2022   Chronic combined systolic and diastolic CHF (congestive heart failure) ([place]) 02/26/2022  Hypertensive urgency    Acute combined systolic and diastolic heart failure (HCC)    Hyperlipidemia LDL goal <70    DCM (dilated cardiomyopathy) ([place])    CHF exacerbation ([place]) 11/10/2021   Aortic dissection distal to left subclavian ([place]) 11/10/2021   Type 2 diabetes mellitus with hyperlipidemia ([place])  11/10/2021   Hyperlipidemia 11/10/2021   Hypertension 11/10/2021   Constipation 01/18/2019   Personal history of breast cancer 07/31/2016   Thoracoabdominal aortic aneurysm (TAAA) without rupture ([place]) 07/09/2016   S/P ascending aortic aneurysm repair 03/06/2015   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Phone: [phone]: [phone]    Social Determinants of Health (SDOH) Social History: SDOH Screenings   Tobacco Use: Medium Risk (07/06/2022)   SDOH Interventions:     Readmission Risk Interventions     No data to display

## 2022-07-09 LAB — RENAL FUNCTION PANEL
Albumin: 3 g/dL — ABNORMAL LOW (ref 3.5–5.0)
Anion gap: 11 (ref 5–15)
BUN: 44 mg/dL — ABNORMAL HIGH (ref 8–23)
CO2: 27 mmol/L (ref 22–32)
Calcium: 8.9 mg/dL (ref 8.9–10.3)
Chloride: 95 mmol/L — ABNORMAL LOW (ref 98–111)
Creatinine, Ser: 1.62 mg/dL — ABNORMAL HIGH (ref 0.44–1.00)
GFR, Estimated: 32 mL/min — ABNORMAL LOW (ref >60)
Glucose, Bld: 157 mg/dL — ABNORMAL HIGH (ref 70–99)
Phosphorus: 4 mg/dL (ref 2.5–4.6)
Potassium: 4 mmol/L (ref 3.5–5.1)
Sodium: 133 mmol/L — ABNORMAL LOW (ref 135–145)

## 2022-07-09 MED ORDER — ENSURE ENLIVE PO LIQD: 237.0000 mL | Freq: Two times a day (BID) | ORAL | Status: DC

## 2022-07-09 MED ORDER — RENA-VITE PO TABS
1.0000 | ORAL_TABLET | Freq: Every day | ORAL | Status: DC
  Administered 2022-07-09: 1 via ORAL
  Filled 2022-07-09: qty 1

## 2022-07-09 MED ORDER — FUROSEMIDE 20 MG PO TABS
20.0000 mg | ORAL_TABLET | Freq: Every day | ORAL | Status: DC
  Administered 2022-07-09: 20 mg via ORAL
  Filled 2022-07-09 (×2): qty 1

## 2022-07-09 NOTE — Discharge Instructions (Signed)

## 2022-07-09 NOTE — Plan of Care (Signed)
  Problem: Education: Goal: Ability to demonstrate management of disease process will improve Outcome: Progressing Goal: Ability to verbalize understanding of medication therapies will improve Outcome: Progressing   Problem: Activity: Goal: Capacity to carry out activities will improve Outcome: Progressing   Problem: Education: Goal: Knowledge of General Education information will improve Description: Including pain rating scale, medication(s)/side effects and non-pharmacologic comfort measures Outcome: Progressing   Problem: Health Behavior/Discharge Planning: Goal: Ability to manage health-related needs will improve Outcome: Progressing   Problem: Clinical Measurements: Goal: Will remain free from infection Outcome: Progressing Goal: Diagnostic test results will improve Outcome: Progressing Goal: Respiratory complications will improve Outcome: Progressing Goal: Cardiovascular complication will be avoided Outcome: Progressing   Problem: Coping: Goal: Level of anxiety will decrease Outcome: Progressing   Problem: Elimination: Goal: Will not experience complications related to bowel motility Outcome: Progressing Goal: Will not experience complications related to urinary retention Outcome: Progressing   Problem: Pain Managment: Goal: General experience of comfort will improve Outcome: Progressing   Problem: Safety: Goal: Ability to remain free from injury will improve Outcome: Progressing   Problem: Skin Integrity: Goal: Risk for impaired skin integrity will decrease Outcome: Progressing

## 2022-07-09 NOTE — Care Management Important Message (Signed)
Important Message  Patient Details  Name: Brittany Greene MRN: [id number] Date of Birth: 08/29/1940   Medicare Important Message Given:  Yes     [name] 07/09/2022, 2:22 PM

## 2022-07-09 NOTE — Progress Notes (Signed)
Mobility Specialist Progress Note:   07/09/22 1006  Mobility  Activity Ambulated with assistance in hallway  Level of Assistance Modified independent, requires aide device or extra time  Assistive Device None  Distance Ambulated (ft) 400 ft  Activity Response Tolerated well  $Mobility charge 1 Mobility   Pt in bed willing to participate in mobility. No complaints of pain. Left in chair with call bell in reach and all needs met.   Brittany Greene Mobility Specialist Please contact via [employer] or  Rehab Office at [phone]

## 2022-07-09 NOTE — Progress Notes (Signed)
Initial Nutrition Assessment  DOCUMENTATION CODES:   Not applicable  INTERVENTION:   - Ensure Enlive po BID, each supplement provides 350 kcal and 20 grams of protein  - Renal MVI daily  NUTRITION DIAGNOSIS:   Increased nutrient needs related to acute illness as evidenced by estimated needs.  GOAL:   Patient will meet greater than or equal to 90% of their needs  MONITOR:   PO intake, Supplement acceptance, Labs, Weight trends  REASON FOR ASSESSMENT:   Consult Diet education  ASSESSMENT:   82 year old female who presented to the ED on 2/10 with SOB. PMH of CAD, HFrEF, PAD s/p L carotid-subclavian bypass and TEVAR, prior celia artery stenosis, AAA s/p repair, HTN, T2DM, HLD, CKD stage IIIa, breast cancer s/p chemotherapy. Pt admitted with acute on chronic CHF, AKI.  RD working remotely.  RD consulted for diet education regarding heart failure. Unable to reach pt via phone call to room. Low Sodium Nutrition Therapy, Sodium Free Flavoring Tips, and Fluid Restricted Nutrition Therapy handouts attached to pt's AVS/Discharge Instructions.  Unable to obtain diet and weight history at this time. Reviewed weight history in chart. Pt with a 5.9 kg weight loss since 02/26/22. This is an 8.8% weight loss in 4 months which is significant for timeframe. Suspect some of weight loss may be attributed to volume status given CHF diagnosis. Pt currently with non-pitting edema to BLE per nursing assessment. Unsure of pt's dry weight.  Documented PO intake is variable. RD to order oral nutrition supplement to aid pt in meeting kcal and protein needs. Will also order daily MVI.  Admit weight: 64.4 kg Current weight: 61.2 kg  Per nursing documentation, pt with non-pitting edema to BLE.  Meal Completion: 40-100%  Medications reviewed and include: jardiance, lasix 20 mg daily  Labs reviewed: sodium 133, BUN 44, creatinine 1.62  UOP not well documented. I/O's: -3.8 L since admit  NUTRITION  - FOCUSED PHYSICAL EXAM:  Unable to complete at this time. RD working remotely.  Diet Order:   Diet Order             Diet Heart Room service appropriate? Yes; Fluid consistency: Thin  Diet effective now                   EDUCATION NEEDS:   Education needs have been addressed  Skin:  Skin Assessment: Reviewed RN Assessment  Last BM:  07/05/22 per pt  Height:   Ht Readings from Last 1 Encounters:  07/06/22 5' 4"$  (1.626 m)    Weight:   Wt Readings from Last 1 Encounters:  07/09/22 61.2 kg   BMI:  Body mass index is 23.16 kg/m.  Estimated Nutritional Needs:   Kcal:  1600-1800  Protein:  80-90 grams  Fluid:  1.6-1.8 L    Gustavus Bryant, MS, RD, LDN Inpatient Clinical Dietitian Please see [facility] for contact information.

## 2022-07-09 NOTE — Progress Notes (Signed)
   Heart Failure Stewardship Pharmacist Progress Note   PCP: Everardo Beals, NP PCP-Cardiologist: Werner Lean, MD    HPI:  82 yo F with PMH of CAD, CHF, PAD, HTN, T2DM, HLD, breast cancer with prior chemo, and CKD IIIa.  Presented to the ED on 2/10 with shortness of breath and orthopnea. She reports she was recently taken off diuretics for labile renal function. CXR with cardiomegaly, vascular congestion, and edema. Last ECHO done 04/29/22 showed LVEF 40-45%, G2DD, global hypokinesis, RV normal.   Current HF Medications: Diuretic: furosemide 20 mg PO daily Beta Blocker: carvedilol 12.5 mg BID SGLT2i: Jardiance 10 mg daily  Prior to admission HF Medications: Beta blocker: carvedilol 12.5 mg BID ACE/ARB/ARNI: Entresto 49/51 mg BID SGLT2i: Jardiance 10 mg daily  Pertinent Lab Values: Serum creatinine 1.62, BUN 44, Potassium 4.0, Sodium 133, BNP >4500, Magnesium 2.2   Vital Signs: Weight: 134 lbs (admission weight: 142 lbs) Blood pressure: 110-140/70s  Heart rate: 60-70s  I/O: incomplete  Medication Assistance / Insurance Benefits Check: Does the patient have prescription insurance?  Yes Type of insurance plan: [place]:  [facility] Is the patient willing to use [facility] at discharge? Yes Is the patient willing to transition their outpatient pharmacy to utilize a [facility] outpatient pharmacy?   Pending    Assessment: 1. Acute on chronic systolic and diastolic CHF (LVEF 65-99%), due to NICM. NYHA class II symptoms. - Agree with restarting furosemide 20 mg PO daily. Strict I/Os and daily weights. Keep K>4 and Mg>2. - Continue carvedilol 12.5 mg BID - BP starting to improve and renal function improved from yesterday - consider restarting low dose Entresto 24/26 mg BID vs spironolactone tomorrow - Continue Jardiance 10 mg daily   Plan: 1) Medication changes recommended at this time: - Start  Entresto 24/26 mg BID or spironolactone tomorrow if able  2) Patient assistance: - None pending  3)  Education  - To be completed prior to discharge  Kerby Nora, PharmD, BCPS Heart Failure Cytogeneticist Phone [phone]

## 2022-07-09 NOTE — Progress Notes (Signed)
Occupational Therapy Treatment and Discharge Patient Details Name: Brittany Greene MRN: [id number] DOB: [birth year] Today's Date: 07/09/2022   History of present illness Pt is 82 year old [facility] on  07/05/22 with SOB due to acute on chronic systolic heart failure. 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   OT comments  Pt progressing towards goals, overall mod I for ADLs and hallway distance ambulation without AD. Pt provided with CHF and energy conservation handouts, pt verbalized understanding and able to state strategies for home. Pt presenting with impairments listed below, however has no acute OT needs at this time, will s/o. Please reconsult if there is a change in pt status.    Recommendations for follow up therapy are one component of a multi-disciplinary discharge planning process, led by the attending physician.  Recommendations may be updated based on patient status, additional functional criteria and insurance authorization.    Follow Up Recommendations  Home health OT     Assistance Recommended at Discharge Intermittent Supervision/Assistance  Patient can return home with the following  A little help with bathing/dressing/bathroom;Assistance with cooking/housework;Assist for transportation;Help with stairs or ramp for entrance   Equipment Recommendations  None recommended by OT    Recommendations for Other Services PT consult    Precautions / Restrictions Precautions Precautions: Fall Restrictions Weight Bearing Restrictions: No       Mobility Bed Mobility               General bed mobility comments: OOB in chair, left seated EOB upon departure    Transfers Overall transfer level: Needs assistance Equipment used: None Transfers: Sit to/from Stand Sit to Stand: Modified independent (Device/Increase time)                 Balance  Overall balance assessment: Needs assistance Sitting-balance support: Feet supported, No upper extremity supported Sitting balance-Leahy Scale: Good     Standing balance support: During functional activity Standing balance-Leahy Scale: Fair                             ADL either performed or assessed with clinical judgement   ADL Overall ADL's : Needs assistance/impaired     Grooming: Wash/dry face;Standing                   Toilet Transfer: Modified Independent;Ambulation;Regular Museum/gallery exhibitions officer and Hygiene: Modified independent       Functional mobility during ADLs: Modified independent      Extremity/Trunk Assessment Upper Extremity Assessment Upper Extremity Assessment: Generalized weakness   Lower Extremity Assessment Lower Extremity Assessment: Defer to PT evaluation        Vision   Vision Assessment?: No apparent visual deficits   Perception Perception Perception: Not tested   Praxis Praxis Praxis: Not tested    Cognition Arousal/Alertness: Awake/alert Behavior During Therapy: WFL for tasks assessed/performed Overall Cognitive Status: Within Functional Limits for tasks assessed                                          Exercises      Shoulder Instructions       General Comments VSS on RA    Pertinent Vitals/ Pain  Pain Assessment Pain Assessment: No/denies pain  Home Living                                          Prior Functioning/Environment              Frequency  Min 2X/week        Progress Toward Goals  OT Goals(current goals can now be found in the care plan section)  Progress towards OT goals: Progressing toward goals  Acute Rehab OT Goals Patient Stated Goal: none stated OT Goal Formulation: With patient Time For Goal Achievement: 07/21/22 Potential to Achieve Goals: Good ADL Goals Pt Will Perform Lower Body Dressing:  Independently;sitting/lateral leans;sit to/from stand Pt Will Perform Tub/Shower Transfer: Shower transfer;Independently;ambulating Additional ADL Goal #1: pt will be able to stand x10 min for functional task in order to improve activity tolerance for ADLs  Plan Discharge plan remains appropriate;Frequency remains appropriate    Co-evaluation                 AM-PAC OT "6 Clicks" Daily Activity     Outcome Measure   Help from another person eating meals?: None Help from another person taking care of personal grooming?: None Help from another person toileting, which includes using toliet, bedpan, or urinal?: None Help from another person bathing (including washing, rinsing, drying)?: A Little Help from another person to put on and taking off regular upper body clothing?: None Help from another person to put on and taking off regular lower body clothing?: None 6 Click Score: 23    End of Session    OT Visit Diagnosis: Unsteadiness on feet (R26.81);Other abnormalities of gait and mobility (R26.89);Muscle weakness (generalized) (M62.81)   Activity Tolerance Patient tolerated treatment well   Patient Left in bed;with call bell/phone within reach;with bed alarm set   Nurse Communication Mobility status        Time: [id number] OT Time Calculation (min): 23 min  Charges: OT General Charges $OT Visit: 1 Visit OT Treatments $Self Care/Home Management : 8-22 mins $Therapeutic Activity: 8-22 mins  Renaye Rakers, OTD, OTR/L SecureChat Preferred Acute Rehab [phone] - [place] 07/09/2022, 2:11 PM

## 2022-07-10 ENCOUNTER — Other Ambulatory Visit (HOSPITAL_COMMUNITY): Payer: Self-pay

## 2022-07-10 LAB — BASIC METABOLIC PANEL
Anion gap: 10 (ref 5–15)
BUN: 43 mg/dL — ABNORMAL HIGH (ref 8–23)
CO2: 27 mmol/L (ref 22–32)
Calcium: 8.7 mg/dL — ABNORMAL LOW (ref 8.9–10.3)
Chloride: 97 mmol/L — ABNORMAL LOW (ref 98–111)
Creatinine, Ser: 1.54 mg/dL — ABNORMAL HIGH (ref 0.44–1.00)
GFR, Estimated: 34 mL/min — ABNORMAL LOW (ref >60)
Glucose, Bld: 140 mg/dL — ABNORMAL HIGH (ref 70–99)
Potassium: 3.9 mmol/L (ref 3.5–5.1)
Sodium: 134 mmol/L — ABNORMAL LOW (ref 135–145)

## 2022-07-10 MED ORDER — SPIRONOLACTONE 25 MG PO TABS
12.5000 mg | ORAL_TABLET | Freq: Every day | ORAL | 0 refills | Status: DC
  Filled 2022-07-10: qty 30, 60d supply, fill #0

## 2022-07-10 MED ORDER — FUROSEMIDE 20 MG PO TABS
20.0000 mg | ORAL_TABLET | Freq: Every day | ORAL | 0 refills | Status: DC
  Filled 2022-07-10: qty 30, 30d supply, fill #0

## 2022-07-10 MED ORDER — SPIRONOLACTONE 12.5 MG HALF TABLET
12.5000 mg | ORAL_TABLET | Freq: Every day | ORAL | Status: DC
  Filled 2022-07-10: qty 1

## 2022-07-10 NOTE — Progress Notes (Signed)
   Heart Failure Stewardship Pharmacist Progress Note   PCP: Everardo Beals, NP PCP-Cardiologist: Werner Lean, MD    HPI:  82 yo F with PMH of CAD, CHF, PAD, HTN, T2DM, HLD, breast cancer with prior chemo, and CKD IIIa.  Presented to the ED on 2/10 with shortness of breath and orthopnea. She reports she was recently taken off diuretics for labile renal function. CXR with cardiomegaly, vascular congestion, and edema. Last ECHO done 04/29/22 showed LVEF 40-45%, G2DD, global hypokinesis, RV normal.   Discharge HF Medications: Diuretic: furosemide 20 mg PO daily Beta Blocker: carvedilol 12.5 mg BID MRA: spironolactone 12.5 mg daily SGLT2i: Jardiance 10 mg daily  Prior to admission HF Medications: Beta blocker: carvedilol 12.5 mg BID ACE/ARB/ARNI: Entresto 49/51 mg BID SGLT2i: Jardiance 10 mg daily  Pertinent Lab Values: Serum creatinine 1.54, BUN 43, Potassium 3.9, Sodium 134, BNP >4500, Magnesium 2.2   Vital Signs: Weight: 135 lbs (admission weight: 142 lbs) Blood pressure: 110/60s  Heart rate: 60-70s  I/O: incomplete  Medication Assistance / Insurance Benefits Check: Does the patient have prescription insurance?  Yes Type of insurance plan: [place]:  [facility] Is the patient willing to use [place] at discharge? Yes Is the patient willing to transition their outpatient pharmacy to utilize a [facility] outpatient pharmacy?   Pending    Assessment: 1. Acute on chronic systolic and diastolic CHF (LVEF 98-33%), due to NICM. NYHA class II symptoms. - Continue furosemide 20 mg PO daily. Strict I/Os and daily weights. Keep K>4 and Mg>2. - Continue carvedilol 12.5 mg BID - Agree with starting spironolactone 12.5 mg daily - Holding PTA Entresto until BP improves - Continue Jardiance 10 mg daily   Plan: 1) Medication changes recommended at this time: - Agree with changes; discharge today  2)  Patient assistance: - None pending  3)  Education  - Patient has been educated on current HF medications and potential additions to HF medication regimen - Patient verbalizes understanding that over the next few months, these medication doses may change and more medications may be added to optimize HF regimen - Patient has been educated on basic disease state pathophysiology and goals of therapy   Kerby Nora, PharmD, BCPS Heart Failure Stewardship Pharmacist Phone [phone]

## 2022-07-10 NOTE — Progress Notes (Signed)
Discharge instructions reviewed with pt.   Copy of instructions given to pt. 2 scripts filled by [facility] and delivered to pt's room. Reviewed CHF/daily weights and when to call MD, pt also has handouts on low sodium diet.   Pt able to reach another daughter that was able to get her clothes and come to take pt home now instead of another daughter later today.  Pt getting dressed and will be d/c'd via wheelchair with belongings, with her daughter.            To be escorted by staff/hospital volunteer.

## 2022-07-14 ENCOUNTER — Telehealth: Payer: Self-pay | Admitting: Internal Medicine

## 2022-07-14 NOTE — Telephone Encounter (Signed)
Pt c/o medication issue:  1. Name of Medication:  aspirin 81 MG chewable tablet  2. How are you currently taking this medication (dosage and times per day)?   3. Are you having a reaction (difficulty breathing--STAT)?   4. What is your medication issue?   Kenney Houseman of [facility] is following up. She would like to clarify whether or not the patient needs to be taking the chewable Aspirin. Please advise.   Call may be returned to [phone]. She mentions that the voicemail is confidential.

## 2022-07-14 NOTE — Discharge Summary (Signed)
Physician Discharge Summary  Brittany Greene [id number] DOB: 05/05/1941 DOA: 07/05/2022  PCP: Everardo Beals, NP  Admit date: 07/05/2022 Discharge date: 07/10/2022  Time spent: 35 minutes  Recommendations for Outpatient Follow-up:  CHF TOC clinic in 1 week PCP in 1 week  Discharge Diagnoses:  Principal Problem:   Acute on chronic systolic CHF (congestive heart failure) ([place]) Active Problems:   Hypertension   AKI (acute kidney injury) ([place])   Type 2 diabetes mellitus with hyperlipidemia ([place]) AAA repair  Discharge Condition: Improved  Diet recommendation: Low-sodium  Filed Weights   07/08/22 0306 07/09/22 0345 07/10/22 0413  Weight: 61.7 kg 61.2 kg 61.4 kg    History of present illness:  82/F w/ CAD, chronic systolic CHF, EF [id number], PAD sp L carotid subclavian bypass and TEVAR, AAA repair, hypertension, CKD and Type 2DM who presented with dyspnea. Patient reported being off diuretics for one month due to reduced GFR. She developed progressive dyspnea and orthopnea. In ED, cr 1,39, BNP >4,500, troponin 121 and 131, CXR w/cardiomegaly, with bilateral hilar vascular congestion, bilateral interstitial infiltrates, Sternotomy wires in place, and aortic endovascular stent present.   Hospital Course:   Acute on chronic systolic CHF (congestive heart failure) ([place]) 04/2022 Echo EF 40 to 45%, with global hypokinesis, RV systolic function preserved, LA and RA with severe dilatation, moderate TR  -diuresed w/ IV lasix, 3.8L negative, wt down 6lbs -then complicated by AKI, diuretics held -Continue carvedilol and empagliflozin.  -Restarted oral Lasix yesterday, creatinine stable, discharge home and follow-up with CHF TOC clinic, add further GDMT as kidney function tolerates   Hypertension Then developed hypotension and worsening renal function per serum cr.  Continue coreg, held diuretics and Entresto -Oral Lasix resumed with improvement in kidney function   AKI on  CKD3a Hypokalemia -baseline creat 1.2 -creat trending up to 1.89, held diuretics briefly -creat better, restarted po lasix today, Entresto discontinued   Type 2 diabetes mellitus with hyperlipidemia ([place]) -stable, started on chart DM's  Discharge Exam: Vitals:   07/10/22 0356 07/10/22 0800  BP: 109/76 117/66  Pulse: 69 67  Resp: 19 20  Temp: 97.8 F (36.6 C) 97.9 F (36.6 C)  SpO2: 99% 97%   General exam: Appears calm and comfortable  Respiratory system: Clear to auscultation Cardiovascular system: S1 & S2 heard, RRR.  Abd: nondistended, soft and nontender.Normal bowel sounds heard. Central nervous system: Alert and oriented. No focal neurological deficits. Extremities: no edema Skin: No rashes Psychiatry:  Mood & affect appropriate.   Discharge Instructions   Discharge Instructions     Diet - low sodium heart healthy   Complete by: As directed    Increase activity slowly   Complete by: As directed       Allergies as of 07/10/2022   No Known Allergies      Medication List     STOP taking these medications    sacubitril-valsartan 49-51 MG Commonly known as: ENTRESTO       TAKE these medications    acetaminophen 650 MG CR tablet Commonly known as: TYLENOL Take 1,300 mg by mouth 2 (two) times daily as needed for pain.   aspirin 81 MG chewable tablet Chew 81 mg by mouth daily.   carvedilol 12.5 MG tablet Commonly known as: COREG Take 1 tablet (12.5 mg total) by mouth 2 (two) times daily with a meal.   CENTRUM SILVER 50+WOMEN PO Take 1 tablet by mouth daily.   empagliflozin 10 MG Tabs tablet Commonly known as: Jardiance Take 1  tablet (10 mg total) by mouth daily before breakfast.   furosemide 20 MG tablet Commonly known as: LASIX Take 1 tablet (20 mg total) by mouth daily. What changed:  when to take this reasons to take this   rosuvastatin 20 MG tablet Commonly known as: CRESTOR Take 1 tablet (20 mg total) by mouth daily.    spironolactone 25 MG tablet Commonly known as: Aldactone Take 0.5 tablets (12.5 mg total) by mouth daily.       No Known Allergies  Follow-up Information     [facility] Follow up.   Specialty: Home Health Services Contact information: [place] [address] [phone]                  The results of significant diagnostics from this hospitalization (including imaging, microbiology, ancillary and laboratory) are listed below for reference.    Significant Diagnostic Studies: DG Chest Port 1 View  Result Date: 07/05/2022 CLINICAL DATA:  Shortness of breath. EXAM: PORTABLE CHEST 1 VIEW COMPARISON:  Chest radiograph dated 11/10/2021. FINDINGS: There is cardiomegaly with vascular congestion and edema. Superimposed pneumonia is not excluded. Clinical correlation is recommended. No large pleural effusion. No pneumothorax. Similar appearance of dilated thoracic aorta and endovascular stent graft repair. No acute osseous pathology. IMPRESSION: Cardiomegaly with vascular congestion and edema. Superimposed pneumonia is not excluded. Electronically Signed   By: Anner Crete M.D.   On: 07/05/2022 22:54    Microbiology: Recent Results (from the past 240 hour(s))  Resp panel by RT-PCR (RSV, Flu A&B, Covid) Anterior Nasal Swab     Status: None   Collection Time: 07/05/22 10:12 PM   Specimen: Anterior Nasal Swab  Result Value Ref Range Status   SARS Coronavirus 2 by RT PCR NEGATIVE NEGATIVE Final   Influenza A by PCR NEGATIVE NEGATIVE Final   Influenza B by PCR NEGATIVE NEGATIVE Final    Comment: (NOTE) The Xpert Xpress SARS-CoV-2/FLU/RSV plus assay is intended as an aid in the diagnosis of influenza from Nasopharyngeal swab specimens and should not be used as a sole basis for treatment. Nasal washings and aspirates are unacceptable for Xpert Xpress SARS-CoV-2/FLU/RSV testing.  Fact Sheet for  Patients: EntrepreneurPulse.com.au  Fact Sheet for Healthcare Providers: IncredibleEmployment.be  This test is not yet approved or cleared by the [place] FDA and has been authorized for detection and/or diagnosis of SARS-CoV-2 by FDA under an Emergency Use Authorization (EUA). This EUA will remain in effect (meaning this test can be used) for the duration of the COVID-19 declaration under Section 564(b)(1) of the Act, 21 U.S.C. section 360bbb-3(b)(1), unless the authorization is terminated or revoked.     Resp Syncytial Virus by PCR NEGATIVE NEGATIVE Final    Comment: (NOTE) Fact Sheet for Patients: EntrepreneurPulse.com.au  Fact Sheet for Healthcare Providers: IncredibleEmployment.be  This test is not yet approved or cleared by the [place] FDA and has been authorized for detection and/or diagnosis of SARS-CoV-2 by FDA under an Emergency Use Authorization (EUA). This EUA will remain in effect (meaning this test can be used) for the duration of the COVID-19 declaration under Section 564(b)(1) of the Act, 21 U.S.C. section 360bbb-3(b)(1), unless the authorization is terminated or revoked.  Performed at [facility], [place] [address], [place], [place] [address]   MRSA Next Gen by PCR, Nasal     Status: None   Collection Time: 07/06/22  5:00 AM   Specimen: Nasal Mucosa; Nasal Swab  Result Value Ref  Range Status   MRSA by PCR Next Gen NOT DETECTED NOT DETECTED Final    Comment: (NOTE) The GeneXpert MRSA Assay (FDA approved for NASAL specimens only), is one component of a comprehensive MRSA colonization surveillance program. It is not intended to diagnose MRSA infection nor to guide or monitor treatment for MRSA infections. Test performance is not FDA approved in patients less than 5 years old. Performed at [facility], [place] [address], [place], [place] [address]      Labs: Basic  Metabolic Panel: Recent Labs  Lab 07/08/22 0015 07/09/22 0019 07/10/22 0009  NA 135 133* 134*  K 3.9 4.0 3.9  CL 95* 95* 97*  CO2 27 27 27  $ GLUCOSE 147* 157* 140*  BUN 41* 44* 43*  CREATININE 1.89* 1.62* 1.54*  CALCIUM 8.7* 8.9 8.7*  MG 2.2  --   --   PHOS  --  4.0  --    Liver Function Tests: Recent Labs  Lab 07/09/22 0019  ALBUMIN 3.0*   No results for input(s): "LIPASE", "AMYLASE" in the last 168 hours. No results for input(s): "AMMONIA" in the last 168 hours. CBC: No results for input(s): "WBC", "NEUTROABS", "HGB", "HCT", "MCV", "PLT" in the last 168 hours. Cardiac Enzymes: No results for input(s): "CKTOTAL", "CKMB", "CKMBINDEX", "TROPONINI" in the last 168 hours. BNP: BNP (last 3 results) Recent Labs    11/10/21 1021 07/05/22 2311  BNP 2,374.9* >4,500.0*    ProBNP (last 3 results) Recent Labs    03/12/22 1319  PROBNP 1,647*    CBG: No results for input(s): "GLUCAP" in the last 168 hours.     Signed:  Domenic Polite MD.  [facility] 07/14/2022, 1:49 PM

## 2022-07-14 NOTE — Telephone Encounter (Signed)
According to the patient recent discharge instructions on 2.10.24. Left message on the confidential mailbox for [facility]. Will forward to MD and nurse.

## 2022-07-14 NOTE — Telephone Encounter (Signed)
Just want to clarify If pt needs to be on chewable Asa or just coated Asa 81 mg

## 2022-07-14 NOTE — Telephone Encounter (Signed)
Left a message to call back

## 2022-07-14 NOTE — Telephone Encounter (Signed)
Kenney Houseman is returning call.

## 2022-07-15 NOTE — Telephone Encounter (Signed)
Called Kenney Houseman nurse with [facility] advised of MD response: Aspirin indication is for non obstructive CAD and her PAD seen at [facility]. She has no hx of GI bleed due to bleeding ulcers.  She could be on aspirin 81 mg (Enteric coated) or the chewable.  Either would be fine.   Kenney Houseman reports pt takes ASA 81 mg EC.  I advised Kenney Houseman this is fine with MD.  EC is probably better for the stomach.  No further questions or concerns voiced.

## 2022-07-22 ENCOUNTER — Encounter: Payer: Self-pay | Admitting: Internal Medicine

## 2022-07-22 ENCOUNTER — Ambulatory Visit: Payer: Medicare Other | Attending: Internal Medicine | Admitting: Internal Medicine

## 2022-07-22 VITALS — BP 118/58 | HR 67 | Ht 63.0 in | Wt 137.0 lb

## 2022-07-22 DIAGNOSIS — I160 Hypertensive urgency: Secondary | ICD-10-CM | POA: Diagnosis present

## 2022-07-22 DIAGNOSIS — I5042 Chronic combined systolic (congestive) and diastolic (congestive) heart failure: Secondary | ICD-10-CM | POA: Insufficient documentation

## 2022-07-22 DIAGNOSIS — I5041 Acute combined systolic (congestive) and diastolic (congestive) heart failure: Secondary | ICD-10-CM | POA: Insufficient documentation

## 2022-07-22 DIAGNOSIS — I420 Dilated cardiomyopathy: Secondary | ICD-10-CM

## 2022-07-22 MED ORDER — EMPAGLIFLOZIN 10 MG PO TABS: 10.0000 mg | ORAL_TABLET | Freq: Every day | ORAL | 3 refills | Status: DC

## 2022-07-22 MED ORDER — SPIRONOLACTONE 25 MG PO TABS: 12.5000 mg | ORAL_TABLET | Freq: Every day | ORAL | 1 refills | Status: DC

## 2022-07-22 MED ORDER — ROSUVASTATIN CALCIUM 20 MG PO TABS: 20.0000 mg | ORAL_TABLET | Freq: Every day | ORAL | 3 refills | Status: DC

## 2022-07-22 MED ORDER — CARVEDILOL 12.5 MG PO TABS: 12.5000 mg | ORAL_TABLET | Freq: Two times a day (BID) | ORAL | 3 refills | Status: DC

## 2022-07-22 MED ORDER — FUROSEMIDE 20 MG PO TABS: 20.0000 mg | ORAL_TABLET | Freq: Every day | ORAL | 1 refills | Status: DC

## 2022-07-22 NOTE — Patient Instructions (Addendum)
Medication Instructions:  Your physician recommends that you continue on your current medications as directed. Please refer to the Current Medication list given to you today.  *If you need a refill on your cardiac medications before your next appointment, please call your pharmacy*  Lab Work: None  Testing/Procedures: None  Follow-Up: At [facility], you and your health needs are our priority.  As part of our continuing mission to provide you with exceptional heart care, we have created designated Provider Care Teams.  These Care Teams include your primary Cardiologist (physician) and Advanced Practice Providers (APPs -  Physician Assistants and Nurse Practitioners) who all work together to provide you with the care you need, when you need it.  Your next appointment:   Next available with Pharmacist for Heart Failure 4-5 month(s)  Provider:   Werner Lean, MD   Your physician has referred you to meet with our Pharmacist to discuss treatment options for your diagnosis of heart failure with reduced ejection fraction. A scheduler will contact you to setup this appointment.

## 2022-07-22 NOTE — Progress Notes (Signed)
Cardiology Office Note:    Date:  07/22/2022   ID:  Brittany Greene, DOB 11-28-1940, MRN [id number]  PCP:  Everardo Beals, NP   [place] Providers Cardiologist:  Werner Lean, MD     Referring MD: Everardo Beals, NP   CC:  Heart failure f/u  History of Present Illness:    Brittany Greene is a 82 y.o. female with a hx of Mild non obstructive CAD, HFrEF (EF 30-35%), Moderate MR, Mild to Moderate AI, PAD s/p L carotid-subclavian bypass and TEVAR [facility]), prior celia artery stenosis, HTN with DM, HLD, and CKD Stage IIIa, breast cancer with unclear chemotherapy and no radiation, who presents for evaluation after HF admission June 2023.  Started on GDMT and s/p outpatient f/u visit. 2023: lasix to PRN and MRA stopped because of AKI  Refused the kidney function. 2024: HF admission on the PRN lasix (never took the PRN lasix)  Patient notes that she is doing poorly.   Her interpretation of her results (see phone note) was that she just stop taking the lasix.  She has never had leg swelling and her intravascular volume on exam can be tricky.  Because her heart function had improved, she did not wish to see nephrology. Had admission for heart failure.  Was not able to take [facility] for hypotension.   No chest pain or pressure .  No SOB/DOE and no PND/Orthopnea.  No weight gain or leg swelling and has never had leg swelling.  No palpitations or syncope . She is having some weakness but getting her strength back.  She and her daughter have questions about how to prevent heart failure hospitalizations.   Past Medical History:  Diagnosis Date   AA (aortic aneurysm) (HCC)    hx of aortic aneurysm repair 02/2015 with redo sternotomy with debranching of innominate and carotid arteries 09/2017, left carotid-subclavian bypass and TEVAR 09/2017   Anemia    Aortic regurgitation    Cancer of left breast ([place])    Cardiomyopathy ([place])    Celiac artery stenosis (HCC)    Chronic  kidney disease, stage 3a ([place])    High cholesterol    History of blood transfusion    "related to surgery"   Hypertension    Mitral regurgitation    Personal history of chemotherapy 2004   Pneumonia    Type II diabetes mellitus ([place])    dx 2006    Past Surgical History:  Procedure Laterality Date   BACK SURGERY     BREAST BIOPSY Left 2003   BREAST CAPSULECTOMY WITH IMPLANT EXCHANGE Left 07/31/2016   Procedure: LEFT BREAST TOTAL CAPSULECTOMY REMOVE IMPLANT WITH DELAYED RECONSTRUCTION WITH PLACMENT WITH SILICONE GEL IMPLANTS;  Surgeon: Crissie Reese, MD;  Location: [place];  Service: Plastics;  Laterality: Left;   BREAST RECONSTRUCTION Left 07/31/2016   BREAST TOTAL CAPSULECTOMY REMOVE IMPLANT WITH DELAYED RECONSTRUCTION WITH PLACMENT WITH SILICONE GEL IMPLANTS    LUMBAR MICRODISCECTOMY  ~ 1990   MASTECTOMY Left 2003   MASTOPEXY Right 07/31/2016   MASTOPEXY Right 07/31/2016   Procedure: RIGHT MASTOPEXY;  Surgeon: Crissie Reese, MD;  Location: [place];  Service: Plastics;  Laterality: Right;   NASAL SINUS SURGERY  1990s   RIGHT/LEFT HEART CATH AND CORONARY ANGIOGRAPHY N/A 11/12/2021   Procedure: RIGHT/LEFT HEART CATH AND CORONARY ANGIOGRAPHY;  Surgeon: Leonie Man, MD;  Location: [place] [facility];  Service: Cardiovascular;  Laterality: N/A;   THORACIC AORTIC ANEURYSM REPAIR  03/06/2015   TONSILLECTOMY  TUBAL LIGATION      Current Medications: Current Meds  Medication Sig   acetaminophen (TYLENOL) 650 MG CR tablet Take 1,300 mg by mouth 2 (two) times daily as needed for pain.   aspirin 81 MG chewable tablet Chew 81 mg by mouth daily.   Multiple Vitamins-Minerals (CENTRUM SILVER 50+WOMEN PO) Take 1 tablet by mouth daily.   polyethylene glycol powder (GLYCOLAX/MIRALAX) 17 GM/SCOOP powder as needed for moderate constipation.   traMADol-acetaminophen (ULTRACET) 37.5-325 MG tablet as needed for severe pain.   [DISCONTINUED] carvedilol (COREG) 12.5 MG tablet Take 1 tablet (12.5 mg  total) by mouth 2 (two) times daily with a meal.   [DISCONTINUED] empagliflozin (JARDIANCE) 10 MG TABS tablet Take 1 tablet (10 mg total) by mouth daily before breakfast.   [DISCONTINUED] furosemide (LASIX) 20 MG tablet Take 1 tablet (20 mg total) by mouth daily.   [DISCONTINUED] rosuvastatin (CRESTOR) 20 MG tablet Take 1 tablet (20 mg total) by mouth daily.   [DISCONTINUED] spironolactone (ALDACTONE) 25 MG tablet Take 0.5 tablets (12.5 mg total) by mouth daily.     Allergies:   Patient has no known allergies.   Social History   Socioeconomic History   Marital status: Widowed    Spouse name: Not on file   Number of children: Not on file   Years of education: Not on file   Highest education level: Not on file  Occupational History   Occupation: retired  Tobacco Use   Smoking status: Former    Packs/day: 0.50    Years: 45.00    Total pack years: 22.50    Types: Cigarettes    Quit date: 11/25/2002    Years since quitting: 19.6   Smokeless tobacco: Never  Vaping Use   Vaping Use: Never used  Substance and Sexual Activity   Alcohol use: Yes    Comment: 07/31/2016 "I'll have a couple drinks on holidays or special occasions"   Drug use: No   Sexual activity: Not on file  Other Topics Concern   Not on file  Social History Narrative   Not on file   Social Determinants of Health   Financial Resource Strain: Not on file  Food Insecurity: Not on file  Transportation Needs: Not on file  Physical Activity: Not on file  Stress: Not on file  Social Connections: Not on file     Family History: The patient's family history includes Heart failure in her mother.  ROS:   Please see the history of present illness.     All other systems reviewed and are negative.  EKGs/Labs/Other Studies Reviewed:    The following studies were reviewed today:  Cardiac Studies & Procedures   CARDIAC CATHETERIZATION  CARDIAC CATHETERIZATION 11/12/2021  Narrative   Ost RCA to Prox RCA lesion is  20% stenosed.   There is no aortic valve stenosis.  Nonischemic Cardiomyopathy Angiographically tortuous but relatively normal coronary arteries with no significant disease. Very tortuous and dilated aorta from abdominal aorta through the stented thoracic aorta.  Patent right subclavian anastomosis  Essentially normal RHC pressures: RAP 8 mmHg, RVP-EDP 29/0-11 mmHg; PAP-mean 29/13-18 mmHg, PCWP 14 mmHg, LVP-EDP 158/9-16 mmHg, AOP-MAP 156/75-105 mmHg.; Ao sat 93%, PA sat 57%; Cardiac Output-Index (Fick) reduced: 4.23 - 2.49   RECOMMENDATIONS Antihypertensive management; GDMT for NICM-compensated CHF Gentle post-cath hydration but monitor for volume overload.    [name], MD  Findings Coronary Findings Diagnostic  Dominance: Right  Left Main Vessel was injected. Difficult to engage Vessel is large. Vessel  is angiographically normal. Initial engagement was limited AL 2 catheter, but did not fully engage, therefore after placement of a long 6 French sheath, and AL 3 catheter was not able to engage, but an XB LAD 4.0 guide catheter was directed into the left main to complete angiography.  Left Anterior Descending Vessel was injected. Vessel is large. The vessel exhibits minimal luminal irregularities. The vessel is moderately tortuous.  First Diagonal Branch Vessel is small in size.  Second Diagonal Branch Vessel is small in size.  Left Circumflex Vessel was injected. Vessel is normal in caliber. The vessel exhibits minimal luminal irregularities. The vessel is moderately tortuous.  First Obtuse Marginal Branch Vessel is moderate in size.  Right Coronary Artery Vessel was injected. Vessel is normal in caliber. Vessel is angiographically normal. There is mild focal disease in the vessel. The vessel is tortuous. Ost RCA to Prox RCA lesion is 20% stenosed.  Right Ventricular Branch Vessel is small in size.  Intervention  No interventions have been documented.      ECHOCARDIOGRAM  ECHOCARDIOGRAM COMPLETE 04/29/2022  Narrative ECHOCARDIOGRAM REPORT    Patient Name:   Ellese Pilling  Date of Exam: 04/29/2022 Medical Rec #:  [id number]     Height:       64.0 in Accession #:    [id number]    Weight:       148.0 lb Date of Birth:  06-11-1940     BSA:          1.721 m Patient Age:    39 years      BP:           140/56 mmHg Patient Gender: F             HR:           55 bpm. Exam Location:  [place]  Procedure: 2D Echo, 3D Echo, Cardiac Doppler and Color Doppler  Indications:    I50.42 CHF  History:        Patient has prior history of Echocardiogram examinations, most recent 11/11/2021. CHF, CAD, PAD, Aortic Valve Disease and Mitral Valve Disease; Risk Factors:Family History of Coronary Artery Disease, Former Smoker, Dyslipidemia, Diabetes and Hypertension. Aortic Anuerysm status post Repair (2016), Left Breast Cancer with Mastectomy (2003), Chemotherapy (2004) and Left Delayed Reconstruction with Silicone Implant ([id number]), Heart Failure with Reduced EF (30-35%).  Sonographer:    Deliah Boston RDCS Referring Phys: [facility] A Kaiyah Eber  IMPRESSIONS   1. Left ventricular ejection fraction, by estimation, is 40 to 45%. The left ventricle has mildly decreased function. The left ventricle demonstrates global hypokinesis. Left ventricular diastolic parameters are consistent with Grade II diastolic dysfunction (pseudonormalization). Elevated left atrial pressure. 2. Right ventricular systolic function is normal. The right ventricular size is not well visualized. Tricuspid regurgitation signal is inadequate for assessing PA pressure. 3. Left atrial size was severely dilated. 4. Right atrial size was severely dilated. 5. The mitral valve is normal in structure. Mild mitral valve regurgitation. 6. The aortic valve is tricuspid. Aortic valve regurgitation is moderate. No aortic stenosis is present. 7. Aortic dilatation noted. There is mild  dilatation of the aortic root, measuring 41 mm. There is mild dilatation of the abdominal aorta, measuring 33 mm.  Comparison(s): Prior images reviewed side by side. The left ventricular function has improved. The left ventricle is no onger dilated. Mitral insufficiency is less significant.  FINDINGS Left Ventricle: Left ventricular ejection fraction, by estimation, is 40 to 45%. The left ventricle has mildly  decreased function. The left ventricle demonstrates global hypokinesis. 3D left ventricular ejection fraction analysis performed but not reported based on interpreter judgement due to suboptimal tracking. The left ventricular internal cavity size was normal in size. There is no left ventricular hypertrophy. Left ventricular diastolic parameters are consistent with Grade II diastolic dysfunction (pseudonormalization). Elevated left atrial pressure.  Right Ventricle: The right ventricular size is not well visualized. No increase in right ventricular wall thickness. Right ventricular systolic function is normal. Tricuspid regurgitation signal is inadequate for assessing PA pressure.  Left Atrium: Left atrial size was severely dilated.  Right Atrium: Right atrial size was severely dilated.  Pericardium: There is no evidence of pericardial effusion.  Mitral Valve: The mitral valve is normal in structure. Mild mitral valve regurgitation, with centrally-directed jet.  Tricuspid Valve: The tricuspid valve is normal in structure. Tricuspid valve regurgitation is trivial.  Aortic Valve: The aortic valve is tricuspid. Aortic valve regurgitation is moderate. Aortic regurgitation PHT measures 577 msec. No aortic stenosis is present.  Pulmonic Valve: The pulmonic valve was grossly normal. Pulmonic valve regurgitation is mild.  Aorta: Aortic dilatation noted. There is mild dilatation of the aortic root, measuring 41 mm. There is mild dilatation of the abdominal aorta, measuring 33 mm.  IAS/Shunts:  There is right bowing of the interatrial septum, suggestive of elevated left atrial pressure. No atrial level shunt detected by color flow Doppler.   LEFT VENTRICLE PLAX 2D LVIDd:         4.40 cm   Diastology LVIDs:         3.50 cm   LV e' medial:    3.00 cm/s LV PW:         0.90 cm   LV E/e' medial:  16.7 LV IVS:        1.00 cm   LV e' lateral:   4.00 cm/s LVOT diam:     2.20 cm   LV E/e' lateral: 12.5 LV SV:         83 LV SV Index:   48 LVOT Area:     3.80 cm  3D Volume EF: 3D EF:        69 % LV EDV:       160 ml LV ESV:       50 ml LV SV:        111 ml  RIGHT VENTRICLE RV Basal diam:  3.75 cm RV S prime:     13.60 cm/s TAPSE (M-mode): 2.0 cm  LEFT ATRIUM              Index        RIGHT ATRIUM           Index LA diam:        3.20 cm  1.86 cm/m   RA Area:     28.05 cm LA Vol (A2C):   104.0 ml 60.42 ml/m  RA Volume:   99.20 ml  57.63 ml/m LA Vol (A4C):   145.0 ml 84.24 ml/m LA Biplane Vol: 124.0 ml 72.04 ml/m AORTIC VALVE LVOT Vmax:   103.00 cm/s LVOT Vmean:  60.700 cm/s LVOT VTI:    0.219 m AI PHT:      577 msec  AORTA Ao Root diam: 4.10 cm Ao Asc diam:  3.40 cm  MITRAL VALVE MV Area (PHT): cm         SHUNTS MV Decel Time: 309 msec    Systemic VTI:  0.22 m MR Peak grad: 140.2 mmHg  Systemic Diam: 2.20 cm MR Mean grad: 81.0 mmHg MR Vmax:      592.00 cm/s MR Vmean:     422.0 cm/s MV E velocity: 50.15 cm/s MV A velocity: 49.45 cm/s MV E/A ratio:  1.01  Mihai Croitoru MD Electronically signed by Sanda Klein MD Signature Date/Time: 04/29/2022/1:31:56 PM    Final              Recent Labs: 11/10/2021: ALT 15 03/12/2022: NT-Pro BNP 1,647 07/05/2022: B Natriuretic Peptide >4,500.0 07/06/2022: Hemoglobin 9.9; Platelets 142 07/08/2022: Magnesium 2.2 07/10/2022: BUN 43; Creatinine, Ser 1.54; Potassium 3.9; Sodium 134  Recent Lipid Panel    Component Value Date/Time   CHOL 147 11/11/2021 0353   TRIG 81 11/11/2021 0353   HDL 50 11/11/2021 0353    CHOLHDL 2.9 11/11/2021 0353   VLDL 16 11/11/2021 0353   LDLCALC 81 11/11/2021 0353    Physical Exam:    VS:  BP (!) 118/58   Pulse 67   Ht '5\' 3"'$  (1.6 m)   Wt 137 lb (62.1 kg)   SpO2 96%   BMI 24.27 kg/m     Wt Readings from Last 3 Encounters:  07/22/22 137 lb (62.1 kg)  07/10/22 135 lb 5.8 oz (61.4 kg)  02/26/22 148 lb (67.1 kg)   Gen: No distress   Neck: No JVD Cardiac: No Rubs or Gallops, systolic and diastolic murmur, regular bradycardia, +2 radial pulses Respiratory: Clear to auscultation bilaterally, normal effort, normal  respiratory rate GI: Soft, nontender, non-distended  MS: No  edema;  moves all extremities Integument: Skin feels warm Neuro:  At time of evaluation, alert and oriented to person/place/time/situation  Psych: Normal affect, patient feels well   ASSESSMENT:    1. Chronic combined systolic and diastolic CHF (congestive heart failure) ([place])   2. Hypertensive urgency   3. Acute combined systolic and diastolic heart failure ([place])   4. DCM (dilated cardiomyopathy) ([place])     PLAN:    Heart Failure Reduced Ejection Fraction  Breast Cancer with prior chemotherapy (unknown) Mitral Regurgitation (mild) Aortic Regurgitation (moderate) HTN with DM CKD Stage IIIb - NYHA class I, Stage C, euvolemic, etiology from HTN vs chemotherapy highest on DDC - Diuretic regimen: Lasix 20 mg PO Daily (unless severe AKI we will not stop this therapy  - Coreg 12.5 mg PO BID - SGLT2i - aldactone 12.5 mg - Pharm D visit: if BP allows for entreso start; start Entresto; if not, start vericiguat, will neeed BP follow up - Echo either this summer or winter for AI based on clinical syndrome  Mild non obstructive CAD PAD s/p L carotid-subclavian bypass and TEVAR [facility]) Prior celia artery stenosis Mixed HLD - continue ASA and BB  We have discussed her kidney function at length.  After long discussion, she is amenable to nephrology consult  Will see me in the  summer  Time Spent Directly with Patient:   I have spent a total of 40 minutes with the patient reviewing notes, imaging, EKGs, labs and examining the patient as well as establishing an assessment and plan that was discussed personally with the patient.  > 50% of time was spent in direct patient care and family and reviewing imaging with patient.     Medication Adjustments/Labs and Tests Ordered: Current medicines are reviewed at length with the patient today.  Concerns regarding medicines are outlined above.  Orders Placed This Encounter  Procedures   AMB Referral to [facility] Pharm-D   Meds ordered this encounter  Medications   carvedilol (COREG) 12.5 MG tablet    Sig: Take 1 tablet (12.5 mg total) by mouth 2 (two) times daily with a meal.    Dispense:  180 tablet    Refill:  3   empagliflozin (JARDIANCE) 10 MG TABS tablet    Sig: Take 1 tablet (10 mg total) by mouth daily before breakfast.    Dispense:  90 tablet    Refill:  3   rosuvastatin (CRESTOR) 20 MG tablet    Sig: Take 1 tablet (20 mg total) by mouth daily.    Dispense:  90 tablet    Refill:  3   furosemide (LASIX) 20 MG tablet    Sig: Take 1 tablet (20 mg total) by mouth daily.    Dispense:  30 tablet    Refill:  1    Please do not fill at this time.   spironolactone (ALDACTONE) 25 MG tablet    Sig: Take 0.5 tablets (12.5 mg total) by mouth daily.    Dispense:  30 tablet    Refill:  1    Patient Instructions  Medication Instructions:  Your physician recommends that you continue on your current medications as directed. Please refer to the Current Medication list given to you today.  *If you need a refill on your cardiac medications before your next appointment, please call your pharmacy*  Lab Work: None  Testing/Procedures: None  Follow-Up: At [facility], you and your health needs are our priority.  As part of our continuing mission to provide you with exceptional heart care, we have created  designated Provider Care Teams.  These Care Teams include your primary Cardiologist (physician) and Advanced Practice Providers (APPs -  Physician Assistants and Nurse Practitioners) who all work together to provide you with the care you need, when you need it.  Your next appointment:   Next available with Pharmacist for Heart Failure 4-5 month(s)  Provider:   Werner Lean, MD   Your physician has referred you to meet with our Pharmacist to discuss treatment options for your diagnosis of heart failure with reduced ejection fraction. A scheduler will contact you to setup this appointment.   Signed, Werner Lean, MD  07/22/2022 12:49 PM    [place]

## 2022-07-31 ENCOUNTER — Telehealth: Payer: Self-pay | Admitting: Internal Medicine

## 2022-07-31 NOTE — Telephone Encounter (Signed)
Called Beth with [facility] left a message to call our office back.  Called pt reports Beth made her aware that was taking spironolactone incorrectly.  Pt went into pill box and 1/2 spironolactone.  Pt denies lightheaded and dizziness.  Rechecked BP 120.  Pt expresses understanding of correct way to take medication.  Advised pt if low BP occurs again to increase fluid intake and eat a small salty snack.  No further concerns at this time.

## 2022-07-31 NOTE — Telephone Encounter (Signed)
BP 96/56 - sitting 86/50 - standing Patient was taking wrong dosage of spironolactone (ALDACTONE) 25 MG tablet ; patient was taking a whole instead of half.

## 2022-08-01 NOTE — Telephone Encounter (Signed)
Called Beth advised that I spoke with pt yesterday 07/31/22.  Pt is aware of mistake and BP as of yesterday has recovered.   Beth reports per [facility] protocol she had to call to notify our office.  I thanked her for call no further concerns at this time.

## 2022-08-01 NOTE — Telephone Encounter (Signed)
Beth, from [place] returning call.

## 2022-08-27 NOTE — Progress Notes (Signed)
Patient ID: Brittany Greene                 DOB: 1940/11/10                      MRN: [id number]     HPI: Brittany Greene is a 82 y.o. female referred by Dr. Izora Ribas to pharmacy clinic for HF medication management. PMH is significant for Mild non obstructive CAD, HFrEF (EF 30-35%), Moderate MR, Mild to Moderate AI, PAD s/p L carotid-subclavian bypass and TEVAR [facility]), prior celia artery stenosis, HTN with DM, HLD, and CKD Stage IIIa, breast cancer with unclear chemotherapy and no radiation . Most recent LVEF 40-45% on 04/29/22.   Per Dr. Izora Ribas note: She has never had leg swelling and her intravascular volume on exam can be tricky.  HF admission in June 2023. 2023: lasix to PRN and MRA stopped because of AKI  Refused to see nephrologist. 2024: HF admission on the PRN lasix (never took the PRN lasix) Pharm D visit: if BP allows for entreso start; start Entresto; if not, start vericiguat, will neeed BP follow up   Patient presents to Pharm.D. clinic today accompanied by a younger family member.  Patient did not have any blood pressure readings or her blood pressure cuff.  Did state that her blood pressures typically in the 120s at home but that she does not check very often.  She has a home health nurse that comes in does check her blood pressure but patient states that she just tells her blood pressure is good with the exception of 1 time where it was low.  This is when she was taking 25 mg of spironolactone by mistake.  She denies any dizziness at this lightheadedness shortness of breath or swelling.  Her weight has been stable, in fact it is decreased a few pounds.  Patient attributes this to confusion around a low-sodium diet in which she was not eating much at all.  States her home health nurse has done education with her and she understands more now how to read food labels and what she should avoid.  Patient unaware what her home heart rate has been.  She reports fatigue and weakness not  feeling back to baseline after her to CHF hospitalizations.  She has completed physical therapy, states she feels better than before but not back to baseline.  Denies trouble affording her medications although she does state that [place] [employer] is a little pricey.  Current CHF meds: furosemide 20mg  daily, carvedilol 12.5mg  twice a day, Jardiance 10mg  daily, spironolactone 12.5mg  daily Previously tried: Entresto (hypotension) Adherence Assessment  Do you ever forget to take your medication? [] Yes [x] No  Do you ever skip doses due to side effects? [] Yes [x] No  Do you have trouble affording your medicines? [] Yes [x] No  Are you ever unable to pick up your medication due to transportation difficulties? [] Yes [x] No  Do you ever stop taking your medications because you don't believe they are helping? [] Yes [x] No  Do you check your weight daily? [x] Yes [] No    BP goal: <130/80   Home BP readings: 120-122 per pt report  Wt Readings from Last 3 Encounters:  07/22/22 137 lb (62.1 kg)  07/10/22 135 lb 5.8 oz (61.4 kg)  02/26/22 148 lb (67.1 kg)   BP Readings from Last 3 Encounters:  07/22/22 (!) 118/58  07/10/22 117/66  02/26/22 (!) 140/56   Pulse Readings from Last 3 Encounters:  08/28/22 (!) 51  07/22/22 67  07/10/22 67    Renal function: CrCl cannot be calculated (Patient's most recent lab result is older than the maximum 21 days allowed.).  Past Medical History:  Diagnosis Date   AA (aortic aneurysm) (HCC)    hx of aortic aneurysm repair 02/2015 with redo sternotomy with debranching of innominate and carotid arteries 09/2017, left carotid-subclavian bypass and TEVAR 09/2017   Anemia    Aortic regurgitation    Cancer of left breast (HCC)    Cardiomyopathy (HCC)    Celiac artery stenosis (HCC)    Chronic kidney disease, stage 3a (HCC)    High cholesterol    History of blood transfusion    "related to surgery"   Hypertension    Mitral regurgitation    Personal history of  chemotherapy 2004   Pneumonia    Type II diabetes mellitus (HCC)    dx 2006    Current Outpatient Medications on File Prior to Visit  Medication Sig Dispense Refill   acetaminophen (TYLENOL) 650 MG CR tablet Take 1,300 mg by mouth 2 (two) times daily as needed for pain.     aspirin 81 MG chewable tablet Chew 81 mg by mouth daily.     carvedilol (COREG) 12.5 MG tablet Take 1 tablet (12.5 mg total) by mouth 2 (two) times daily with a meal. 180 tablet 3   empagliflozin (JARDIANCE) 10 MG TABS tablet Take 1 tablet (10 mg total) by mouth daily before breakfast. 90 tablet 3   Multiple Vitamins-Minerals (CENTRUM SILVER 50+WOMEN PO) Take 1 tablet by mouth daily.     polyethylene glycol powder (GLYCOLAX/MIRALAX) 17 GM/SCOOP powder as needed for moderate constipation.     rosuvastatin (CRESTOR) 20 MG tablet Take 1 tablet (20 mg total) by mouth daily. 90 tablet 3   sacubitril-valsartan (ENTRESTO) 24-26 MG Take 1 tablet by mouth 2 (two) times daily. 60 tablet 11   spironolactone (ALDACTONE) 25 MG tablet Take 0.5 tablets (12.5 mg total) by mouth daily. 30 tablet 1   furosemide (LASIX) 20 MG tablet Take 0.5 tablets (10 mg total) by mouth daily. 45 tablet 1   traMADol-acetaminophen (ULTRACET) 37.5-325 MG tablet as needed for severe pain.     No current facility-administered medications on file prior to visit.    No Known Allergies   Assessment/Plan:  1. CHF -  Chronic combined systolic and diastolic CHF (congestive heart failure) Assessment: Blood pressure in clinic 140/62 manually 135/55 with the automatic cuff Reported home blood pressures 120-122 systolic does not sound like patient checks very frequently Patient does weigh herself every morning, weight has been stable The management of patient's heart failure does seem to be a little overwhelming the patient However she does seem to be aware of her medications and is what she is taking Heart rate in clinic went down to 41, patient unaware  what her heart rate at home is.  I have asked her to keep an eye on this  Plan: After discussion with the patient and Dr. Freda Jackson, will resume Entresto at 24/26mg  BID.  Patient has 2 bottles of Entresto 49 to 51 mg at home therefore she will take 1/2 tablet twice a day Decrease furosemide to 10 mg daily Continue carvedilol 12.5 mg twice a day, Jardiance 10 mg daily and spironolactone 12.5 mg daily Patient will come for [facility]  4/15 Follow-up in clinic in 3 weeks.  I have asked patient to check her blood pressure and bring both her blood pressure cuff and her readings next visit Patient did  not want new prescription for furosemide and Entresto sent to the pharmacy since she had plenty at home and would last a while since she is cutting them in half therefore med list is updated with historical meds.  Thank you   Olene FlossMelissa D Druscilla Petsch, Pharm.D, BCPS, CPP [facility] Division of [facility] [address], [place], [place] [address]  Phone: [phone]; Fax: [phone]

## 2022-08-28 ENCOUNTER — Ambulatory Visit: Payer: Medicare Other | Attending: Ophthalmology | Admitting: Pharmacist

## 2022-08-28 VITALS — HR 51

## 2022-08-28 DIAGNOSIS — I5041 Acute combined systolic (congestive) and diastolic (congestive) heart failure: Secondary | ICD-10-CM | POA: Insufficient documentation

## 2022-08-28 DIAGNOSIS — I5023 Acute on chronic systolic (congestive) heart failure: Secondary | ICD-10-CM | POA: Insufficient documentation

## 2022-08-28 DIAGNOSIS — I5042 Chronic combined systolic (congestive) and diastolic (congestive) heart failure: Secondary | ICD-10-CM | POA: Insufficient documentation

## 2022-08-28 NOTE — Patient Instructions (Addendum)
Summary of today's discussion  1.Start taking Entresto 24/26mg  (one half of a 49/51mg  tablet) two times a day Decrease furosemide to 10mg  daily  2.Continue carvedilol 12.5mg  twice a day, Jardiance 10mg  daily, spironolactone 12.5mg  daily  3. Please check blood pressure and heart rate at home. Please bring your blood pressure machine and your blood pressure readings to your next visit  4.Lab work 4/15  5.Follow up appointment 4/26 @3 :00   Your blood pressure goal is <130/80  To check your pressure at home you will need to:  1. Sit up in a chair, with feet flat on the floor and back supported. Do not cross your ankles or legs. 2. Rest your left arm so that the cuff is about heart level. If the cuff goes on your upper arm,  then just relax the arm on the table, arm of the chair or your lap. If you have a wrist cuff, we  suggest relaxing your wrist against your chest (think of it as Pledging the Flag with the  wrong arm).  3. Place the cuff snugly around your arm, about 1 inch above the crook of your elbow. The  cords should be inside the groove of your elbow.  4. Sit quietly, with the cuff in place, for about 5 minutes. After that 5 minutes press the power  button to start a reading. 5. Do not talk or move while the reading is taking place.  6. Record your readings on a sheet of paper. Although most cuffs have a memory, it is often  easier to see a pattern developing when the numbers are all in front of you.  7. You can repeat the reading after 1-3 minutes if it is recommended  Make sure your bladder is empty and you have not had caffeine or tobacco within the last 30 min  Always bring your blood pressure log with you to your appointments. If you have not brought your monitor in to be double checked for accuracy, please bring it to your next appointment.  You can find a list of validated (accurate) blood pressure cuffs at [employer].it   Important lifestyle changes to control high  blood pressure  Intervention  Effect on the BP  Lose extra pounds and watch your waistline Weight loss is one of the most effective lifestyle changes for controlling blood pressure. If you're overweight or obese, losing even a small amount of weight can help reduce blood pressure. Blood pressure might go down by about 1 millimeter of mercury (mm Hg) with each kilogram (about 2.2 pounds) of weight lost.  Exercise regularly As a general goal, aim for at least 30 minutes of moderate physical activity every day. Regular physical activity can lower high blood pressure by about 5 to 8 mm Hg.  Eat a healthy diet Eating a diet rich in whole grains, fruits, vegetables, and low-fat dairy products and low in saturated fat and cholesterol. A healthy diet can lower high blood pressure by up to 11 mm Hg.  Reduce salt (sodium) in your diet Even a small reduction of sodium in the diet can improve heart health and reduce high blood pressure by about 5 to 6 mm Hg.  Limit alcohol One drink equals 12 ounces of beer, 5 ounces of wine, or 1.5 ounces of 80-proof liquor.  Limiting alcohol to less than one drink a day for women or two drinks a day for men can help lower blood pressure by about 4 mm Hg.   Please call me  at [phone] with any questions.

## 2022-08-28 NOTE — Assessment & Plan Note (Signed)
Assessment: Blood pressure in clinic 140/62 manually 135/55 with the automatic cuff Reported home blood pressures [id number] systolic does not sound like patient checks very frequently Patient does weigh herself every morning, weight has been stable The management of patient's heart failure does seem to be a little overwhelming the patient However she does seem to be aware of her medications and is what she is taking Heart rate in clinic went down to 41, patient unaware what her heart rate at home is.  I have asked her to keep an eye on this  Plan: After discussion with the patient and Dr. Dwyane Dee, will resume Entresto at 24/26mg  BID.  Patient has 2 bottles of Entresto 49 to 51 mg at home therefore she will take 1/2 tablet twice a day Decrease furosemide to 10 mg daily Continue carvedilol 12.5 mg twice a day, Jardiance 10 mg daily and spironolactone 12.5 mg daily Patient will come for [facility]  4/15 Follow-up in clinic in 3 weeks.  I have asked patient to check her blood pressure and bring both her blood pressure cuff and her readings next visit Patient did not want new prescription for furosemide and Entresto sent to the pharmacy since she had plenty at home and would last a while since she is cutting them in half therefore med list is updated with historical meds.

## 2022-09-07 IMAGING — DX DG CHEST 1V PORT
1 series · 1 of 1 positions shown · non-contrast
Comparison: Chest x-ray 11/30/2014.

CLINICAL DATA: 80-year-old female with history of chest pain.

EXAM:
PORTABLE CHEST 1 VIEW

[chest]
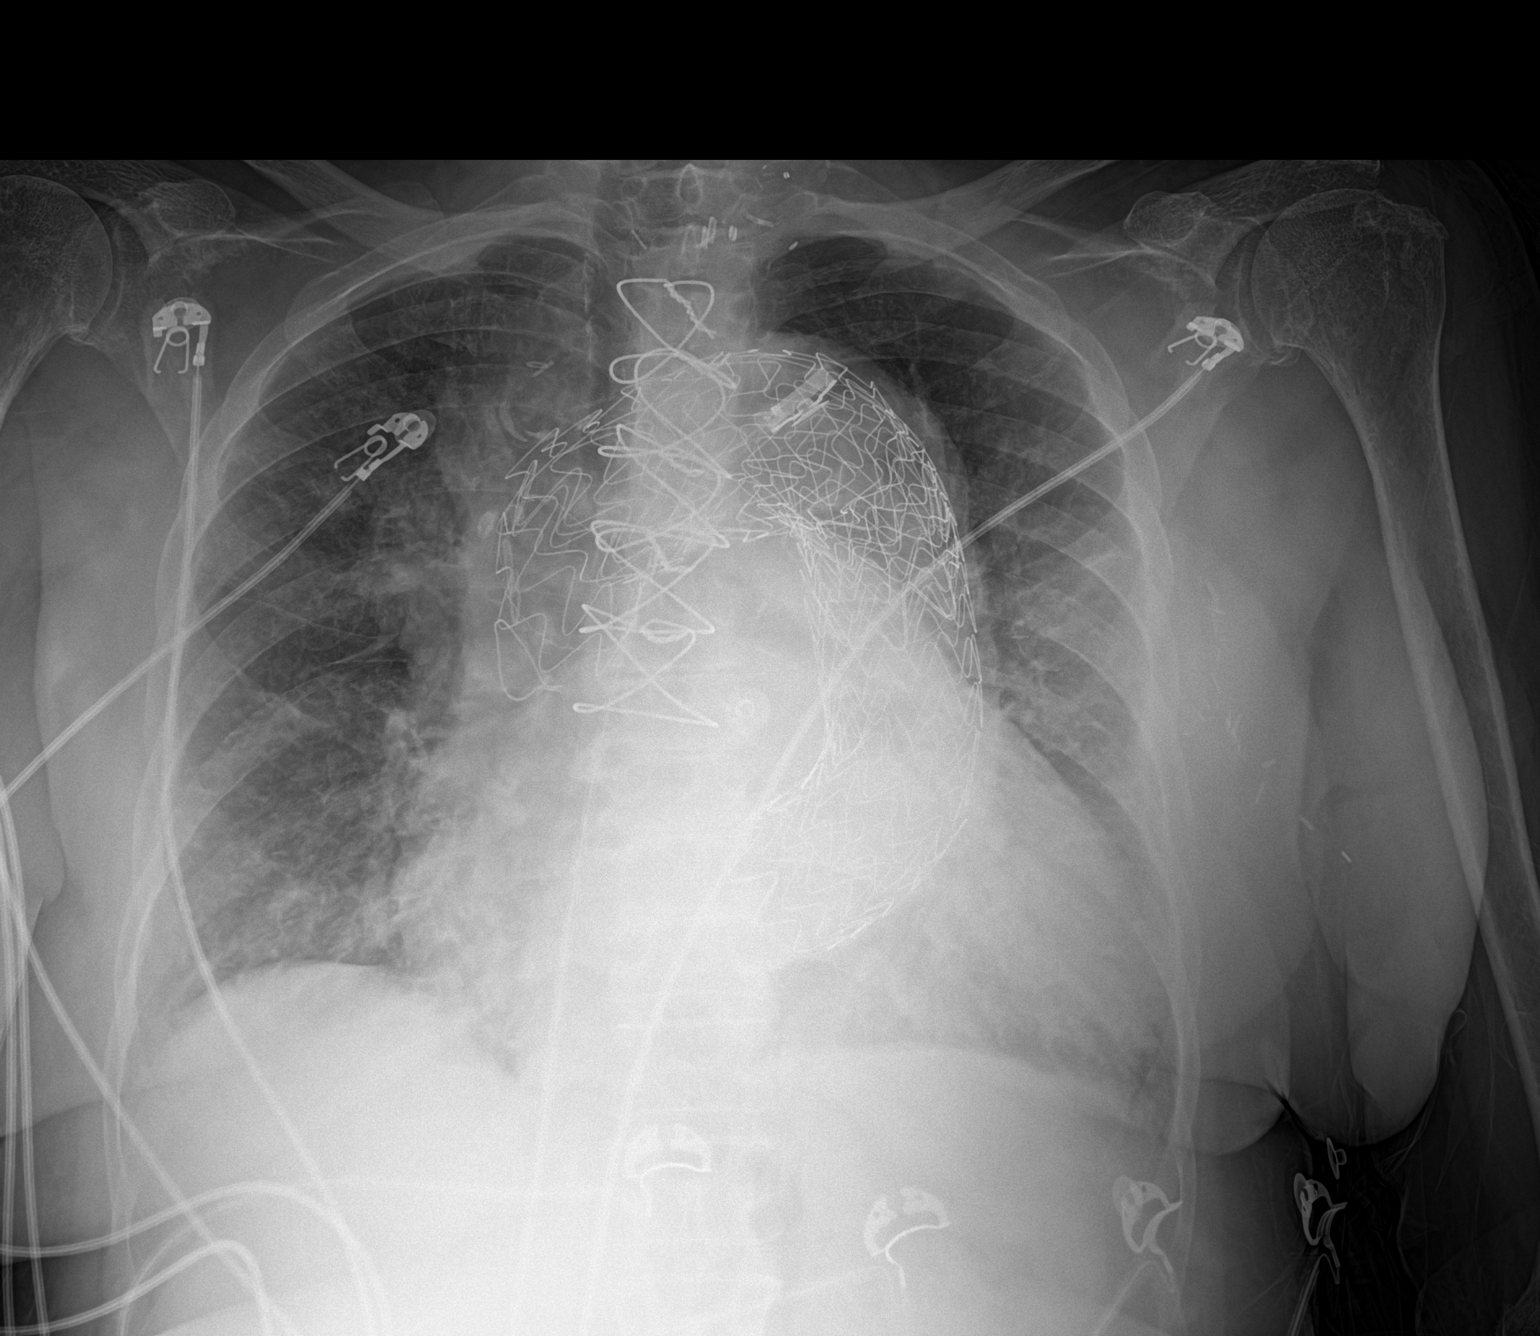

[1 of 1 positions shown; findings below may reference images not displayed]

FINDINGS: There is cephalization of the pulmonary vasculature and slight
indistinctness of the interstitial markings suggestive of mild
pulmonary edema. No definite pleural effusions. Mild cardiomegaly.
Severe tortuosity of the chronically dilated thoracic aorta with
endograft noted. Status post median sternotomy.
IMPRESSION: 1. The appearance the chest suggests mild congestive heart failure,
as above.
2. Postoperative and postprocedural changes, as above.

## 2022-09-08 ENCOUNTER — Ambulatory Visit: Payer: Medicare Other | Attending: Internal Medicine

## 2022-09-08 DIAGNOSIS — I5023 Acute on chronic systolic (congestive) heart failure: Secondary | ICD-10-CM

## 2022-09-09 ENCOUNTER — Telehealth: Payer: Self-pay | Admitting: Pharmacist

## 2022-09-09 LAB — BASIC METABOLIC PANEL
BUN/Creatinine Ratio: 22 (ref 12–28)
BUN: 38 mg/dL — ABNORMAL HIGH (ref 8–27)
CO2: 18 mmol/L — ABNORMAL LOW (ref 20–29)
Calcium: 9.5 mg/dL (ref 8.7–10.3)
Chloride: 103 mmol/L (ref 96–106)
Creatinine, Ser: 1.72 mg/dL — ABNORMAL HIGH (ref 0.57–1.00)
Glucose: 161 mg/dL — ABNORMAL HIGH (ref 70–99)
Potassium: 4.9 mmol/L (ref 3.5–5.2)
Sodium: 140 mmol/L (ref 134–144)
eGFR: 29 mL/min/{1.73_m2} — ABNORMAL LOW (ref >59)

## 2022-09-09 NOTE — Telephone Encounter (Signed)
Increase in scr since starting Entresto. This is less than 30% but pt scr has progressively increased. Spoke with Dr. Izora Ribas and will stop spironolactone. Called pt and made her aware. She has not been checking her BP daily. States its a lot, but she has checked a few times and has a few readings from MD visits (which look good). Follow up next week with me in clinic. Will recheck BMP at that time.

## 2022-09-19 ENCOUNTER — Ambulatory Visit: Payer: Medicare Other | Attending: Cardiology | Admitting: Pharmacist

## 2022-09-19 VITALS — BP 122/62 | HR 58 | Wt 140.0 lb

## 2022-09-19 DIAGNOSIS — I5041 Acute combined systolic (congestive) and diastolic (congestive) heart failure: Secondary | ICD-10-CM

## 2022-09-19 DIAGNOSIS — I5042 Chronic combined systolic (congestive) and diastolic (congestive) heart failure: Secondary | ICD-10-CM

## 2022-09-19 DIAGNOSIS — I420 Dilated cardiomyopathy: Secondary | ICD-10-CM | POA: Diagnosis not present

## 2022-09-19 NOTE — Assessment & Plan Note (Signed)
Assessment: Blood pressure adequately controlled in clinic today She does have several low readings at home HR ok, but borderline low Patient has no sx of low BP No swelling/SOB  Plan: Recheck BMP today Decrease carvedilol to 6.25mg  BID to see if it helps with energy and increase BP some I will call pt in 2 weeks for follow up

## 2022-09-19 NOTE — Patient Instructions (Addendum)
Summary of today's discussion  1.Decrease carvedilol to 6.25mg  (1/2 tablet) twice a day  2.Continue Entresto 1/2 tablet (24/26mg ) twice a day, furosemide 10mg  (1/2 tablet) daily and Jardiance 10mg  daily  3.Continue to monitor blood pressure   4.I will call you in 2-3 weeks to follow up  5.   Your blood pressure goal is <130/80  To check your pressure at home you will need to:  1. Sit up in a chair, with feet flat on the floor and back supported. Do not cross your ankles or legs. 2. Rest your left arm so that the cuff is about heart level. If the cuff goes on your upper arm,  then just relax the arm on the table, arm of the chair or your lap. If you have a wrist cuff, we  suggest relaxing your wrist against your chest (think of it as Pledging the Flag with the  wrong arm).  3. Place the cuff snugly around your arm, about 1 inch above the crook of your elbow. The  cords should be inside the groove of your elbow.  4. Sit quietly, with the cuff in place, for about 5 minutes. After that 5 minutes press the power  button to start a reading. 5. Do not talk or move while the reading is taking place.  6. Record your readings on a sheet of paper. Although most cuffs have a memory, it is often  easier to see a pattern developing when the numbers are all in front of you.  7. You can repeat the reading after 1-3 minutes if it is recommended  Make sure your bladder is empty and you have not had caffeine or tobacco within the last 30 min  Always bring your blood pressure log with you to your appointments. If you have not brought your monitor in to be double checked for accuracy, please bring it to your next appointment.  You can find a list of validated (accurate) blood pressure cuffs at [employer].no   Important lifestyle changes to control high blood pressure  Intervention  Effect on the BP  Lose extra pounds and watch your waistline Weight loss is one of the most effective lifestyle changes  for controlling blood pressure. If you're overweight or obese, losing even a small amount of weight can help reduce blood pressure. Blood pressure might go down by about 1 millimeter of mercury (mm Hg) with each kilogram (about 2.2 pounds) of weight lost.  Exercise regularly As a general goal, aim for at least 30 minutes of moderate physical activity every day. Regular physical activity can lower high blood pressure by about 5 to 8 mm Hg.  Eat a healthy diet Eating a diet rich in whole grains, fruits, vegetables, and low-fat dairy products and low in saturated fat and cholesterol. A healthy diet can lower high blood pressure by up to 11 mm Hg.  Reduce salt (sodium) in your diet Even a small reduction of sodium in the diet can improve heart health and reduce high blood pressure by about 5 to 6 mm Hg.  Limit alcohol One drink equals 12 ounces of beer, 5 ounces of wine, or 1.5 ounces of 80-proof liquor.  Limiting alcohol to less than one drink a day for women or two drinks a day for men can help lower blood pressure by about 4 mm Hg.   Please call me at [phone] with any questions.

## 2022-09-19 NOTE — Progress Notes (Unsigned)
Patient ID: [name]                 DOB: January 09, 1941                      MRN: [id number]     HPI: Brittany Greene is a 82 y.o. female referred by Dr. Izora Ribas to pharmacy clinic for HF medication management. PMH is significant for Mild non obstructive CAD, HFrEF (EF 30-35%), Moderate MR, Mild to Moderate AI, PAD s/p L carotid-subclavian bypass and TEVAR [facility]), prior celia artery stenosis, HTN with DM, HLD, and CKD Stage IIIa, breast cancer with unclear chemotherapy and no radiation . Most recent LVEF 40-45% on 04/29/22.   Per Dr. Izora Ribas note: She has never had leg swelling and her intravascular volume on exam can be tricky.  HF admission in June 2023. 2023: lasix to PRN and MRA stopped because of AKI  Refused to see nephrologist. 2024: HF admission on the PRN lasix (never took the PRN lasix) Pharm D visit: if BP allows for entreso start; start Entresto; if not, start vericiguat, will neeed BP follow up   At last PharmD visit, patients blood pressure was adequate enough to resume Entresto. She reported not taking her blood pressure very often but reported it was typically in the 120's at home if she or the home health RN checked it. She was very overwhelmed with checking blood pressure and weight and managing sodium intake. On repeat labs, scr had increased less than 30% but her scr had been progressively increasing from baseline. Therefore spironolactone was stopped.  Patient presents to PharmD clinic today. She brings in her home BP cuff and her readings that home health and PCP took. Her home cuff was found to run about 8 points high for systolic. BP in clinic 122/62. She denies any dizziness, light headedness, swelling or SOB. She states she still feels tired, lack of energy. Got a B12 shot on Wed. PCP mentioned that her carvedilol can make her feel tired and to discuss with cardiology. HR at home in the 60's. HR is 58 in clinic today.   130/66 home cuff Clinic 122/62  Current CHF  meds: furosemide 10mg  daily, carvedilol 12.5mg  twice a day, Jardiance 10mg  daily, Entresto 24/26mg  twice a day Previously tried: Entresto (hypotension) Adherence Assessment  Do you ever forget to take your medication? [] Yes [x] No  Do you ever skip doses due to side effects? [] Yes [x] No  Do you have trouble affording your medicines? [] Yes [x] No  Are you ever unable to pick up your medication due to transportation difficulties? [] Yes [x] No  Do you ever stop taking your medications because you don't believe they are helping? [] Yes [x] No  Do you check your weight daily? [x] Yes [] No    BP goal: <130/80  Home BP readings: 140/76, 120/70, 121/58, 96/56, 121/61, 99/53, 96/60, 83/49, 89/52, 100/57, 105/57, 106/55, 131/69,114/61 98/70,123/64 HR 64, 65  Wt Readings from Last 3 Encounters:  09/19/22 140 lb (63.5 kg)  07/22/22 137 lb (62.1 kg)  07/10/22 135 lb 5.8 oz (61.4 kg)   BP Readings from Last 3 Encounters:  09/19/22 122/62  07/22/22 (!) 118/58  07/10/22 117/66   Pulse Readings from Last 3 Encounters:  09/19/22 (!) 58  08/28/22 (!) 51  07/22/22 67    Renal function: Estimated Creatinine Clearance: 22.6 mL/min (A) (by C-G formula based on SCr of 1.72 mg/dL (H)).  Past Medical History:  Diagnosis Date   AA (aortic aneurysm) (HCC)  hx of aortic aneurysm repair 02/2015 with redo sternotomy with debranching of innominate and carotid arteries 09/2017, left carotid-subclavian bypass and TEVAR 09/2017   Anemia    Aortic regurgitation    Cancer of left breast (HCC)    Cardiomyopathy (HCC)    Celiac artery stenosis (HCC)    Chronic kidney disease, stage 3a (HCC)    High cholesterol    History of blood transfusion    "related to surgery"   Hypertension    Mitral regurgitation    Personal history of chemotherapy 2004   Pneumonia    Type II diabetes mellitus (HCC)    dx 2006    Current Outpatient Medications on File Prior to Visit  Medication Sig Dispense Refill    acetaminophen (TYLENOL) 650 MG CR tablet Take 1,300 mg by mouth 2 (two) times daily as needed for pain.     aspirin 81 MG chewable tablet Chew 81 mg by mouth daily.     carvedilol (COREG) 12.5 MG tablet Take 0.5 tablets (6.25 mg total) by mouth 2 (two) times daily with a meal. 180 tablet 3   empagliflozin (JARDIANCE) 10 MG TABS tablet Take 1 tablet (10 mg total) by mouth daily before breakfast. 90 tablet 3   furosemide (LASIX) 20 MG tablet Take 0.5 tablets (10 mg total) by mouth daily. 45 tablet 1   Multiple Vitamins-Minerals (CENTRUM SILVER 50+WOMEN PO) Take 1 tablet by mouth daily.     polyethylene glycol powder (GLYCOLAX/MIRALAX) 17 GM/SCOOP powder as needed for moderate constipation.     rosuvastatin (CRESTOR) 20 MG tablet Take 1 tablet (20 mg total) by mouth daily. 90 tablet 3   sacubitril-valsartan (ENTRESTO) 24-26 MG Take 1 tablet by mouth 2 (two) times daily. 60 tablet 11   traMADol-acetaminophen (ULTRACET) 37.5-325 MG tablet as needed for severe pain.     No current facility-administered medications on file prior to visit.    No Known Allergies   Assessment/Plan:  1. CHF -  Chronic combined systolic and diastolic CHF (congestive heart failure) (HCC) Assessment: Blood pressure adequately controlled in clinic today She does have several low readings at home HR ok, but borderline low Patient has no sx of low BP No swelling/SOB  Plan: Recheck BMP today Decrease carvedilol to 6.25mg  BID to see if it helps with energy and increase BP some I will call pt in 2 weeks for follow up   Thank you   Olene Floss, Pharm.D, BCPS, CPP [facility] Division of [facility] [address] [place], [place] [address]  Phone: [phone]; Fax: [phone]

## 2022-09-20 LAB — BASIC METABOLIC PANEL
BUN/Creatinine Ratio: 22 (ref 12–28)
BUN: 34 mg/dL — ABNORMAL HIGH (ref 8–27)
CO2: 19 mmol/L — ABNORMAL LOW (ref 20–29)
Calcium: 9.5 mg/dL (ref 8.7–10.3)
Chloride: 102 mmol/L (ref 96–106)
Creatinine, Ser: 1.52 mg/dL — ABNORMAL HIGH (ref 0.57–1.00)
Glucose: 111 mg/dL — ABNORMAL HIGH (ref 70–99)
Potassium: 4.8 mmol/L (ref 3.5–5.2)
Sodium: 139 mmol/L (ref 134–144)
eGFR: 34 mL/min/{1.73_m2} — ABNORMAL LOW (ref >59)

## 2022-10-09 ENCOUNTER — Telehealth: Payer: Self-pay | Admitting: Pharmacist

## 2022-10-09 NOTE — Telephone Encounter (Signed)
Called pt to f/u on her BP and how she is feeling after decreasing carvedilol. Patient states she feels much better. More energy. Says she hasn't been checking BP. [name] out of town for a funeral. Patient appreciated the follow up call. She will call when she needs a refill and we can send in the lower dose of carvedilol and Entresto to her pharmacy.

## 2022-11-24 ENCOUNTER — Other Ambulatory Visit: Payer: Self-pay | Admitting: Internal Medicine

## 2022-12-05 ENCOUNTER — Other Ambulatory Visit: Payer: Self-pay | Admitting: Nurse Practitioner

## 2022-12-05 DIAGNOSIS — Z1231 Encounter for screening mammogram for malignant neoplasm of breast: Secondary | ICD-10-CM

## 2022-12-25 ENCOUNTER — Ambulatory Visit: Payer: Medicare Other | Attending: Internal Medicine | Admitting: Internal Medicine

## 2022-12-25 ENCOUNTER — Encounter: Payer: Self-pay | Admitting: Internal Medicine

## 2022-12-25 VITALS — BP 130/60 | HR 66 | Ht 64.0 in | Wt 139.0 lb

## 2022-12-25 DIAGNOSIS — Z9889 Other specified postprocedural states: Secondary | ICD-10-CM | POA: Insufficient documentation

## 2022-12-25 DIAGNOSIS — Z8679 Personal history of other diseases of the circulatory system: Secondary | ICD-10-CM | POA: Diagnosis present

## 2022-12-25 DIAGNOSIS — E785 Hyperlipidemia, unspecified: Secondary | ICD-10-CM | POA: Insufficient documentation

## 2022-12-25 DIAGNOSIS — I10 Essential (primary) hypertension: Secondary | ICD-10-CM | POA: Insufficient documentation

## 2022-12-25 DIAGNOSIS — I716 Thoracoabdominal aortic aneurysm, without rupture, unspecified: Secondary | ICD-10-CM | POA: Insufficient documentation

## 2022-12-25 DIAGNOSIS — I5042 Chronic combined systolic (congestive) and diastolic (congestive) heart failure: Secondary | ICD-10-CM | POA: Diagnosis present

## 2022-12-25 DIAGNOSIS — E1169 Type 2 diabetes mellitus with other specified complication: Secondary | ICD-10-CM | POA: Diagnosis present

## 2022-12-25 DIAGNOSIS — I351 Nonrheumatic aortic (valve) insufficiency: Secondary | ICD-10-CM | POA: Diagnosis present

## 2022-12-25 NOTE — Progress Notes (Signed)
Cardiology Office Note:    Date:  12/25/2022   ID:  Brittany Greene, DOB 01-13-1941, MRN [id number]  PCP:  Debroah Baller, FNP   [facility] Providers Cardiologist:  Christell Constant, MD     Referring MD: Elie Confer, NP   CC:  Heart failure f/u  History of Present Illness:    Brittany Greene is a 82 y.o. female with a hx of Mild non obstructive CAD, HFrEF (EF 30-35%), Moderate MR, Mild to Moderate AI, PAD s/p L carotid-subclavian bypass and TEVAR [facility]), prior celia artery stenosis, HTN with DM, HLD, and CKD Stage IIIa, breast cancer with unclear chemotherapy and no radiation, who presents for evaluation after HF admission June 2023.  Started on GDMT and s/p outpatient f/u visit. 2023: lasix to PRN and MRA stopped because of AKI  Refused the kidney function. 2024: HF admission on the PRN lasix (never took the PRN lasix), moderate AI and Mild MR on echo.  Patient notes that she is doing well.   Since last visit notes that she was feeling a little tired   Thinks her B12 was low; she improved with this in the fast.  There are no interval hospital/ED visit.    No chest pain or pressure.  No SOB/DOE and no PND/Orthopnea.  No weight gain or leg swelling.  No palpitations or syncope.  Overall feels better than prior.  Daily weights 137-137 yesterday to today.  Past Medical History:  Diagnosis Date   AA (aortic aneurysm) (HCC)    hx of aortic aneurysm repair 02/2015 with redo sternotomy with debranching of innominate and carotid arteries 09/2017, left carotid-subclavian bypass and TEVAR 09/2017   Anemia    Aortic regurgitation    Cancer of left breast (HCC)    Cardiomyopathy (HCC)    Celiac artery stenosis (HCC)    Chronic kidney disease, stage 3a (HCC)    High cholesterol    History of blood transfusion    "related to surgery"   Hypertension    Mitral regurgitation    Personal history of chemotherapy 2004   Pneumonia    Type II diabetes mellitus (HCC)     dx 2006    Past Surgical History:  Procedure Laterality Date   BACK SURGERY     BREAST BIOPSY Left 2003   BREAST CAPSULECTOMY WITH IMPLANT EXCHANGE Left 07/31/2016   Procedure: LEFT BREAST TOTAL CAPSULECTOMY REMOVE IMPLANT WITH DELAYED RECONSTRUCTION WITH PLACMENT WITH SILICONE GEL IMPLANTS;  Surgeon: Etter Sjogren, MD;  Location: [facility] OR;  Service: Plastics;  Laterality: Left;   BREAST RECONSTRUCTION Left 07/31/2016   BREAST TOTAL CAPSULECTOMY REMOVE IMPLANT WITH DELAYED RECONSTRUCTION WITH PLACMENT WITH SILICONE GEL IMPLANTS    LUMBAR MICRODISCECTOMY  ~ 1990   MASTECTOMY Left 2003   MASTOPEXY Right 07/31/2016   MASTOPEXY Right 07/31/2016   Procedure: RIGHT MASTOPEXY;  Surgeon: Etter Sjogren, MD;  Location: [facility] OR;  Service: Plastics;  Laterality: Right;   NASAL SINUS SURGERY  1990s   RIGHT/LEFT HEART CATH AND CORONARY ANGIOGRAPHY N/A 11/12/2021   Procedure: RIGHT/LEFT HEART CATH AND CORONARY ANGIOGRAPHY;  Surgeon: Marykay Lex, MD;  Location: [facility] INVASIVE CV LAB;  Service: Cardiovascular;  Laterality: N/A;   THORACIC AORTIC ANEURYSM REPAIR  03/06/2015   TONSILLECTOMY     TUBAL LIGATION      Current Medications: Current Meds  Medication Sig   acetaminophen (TYLENOL) 650 MG CR tablet Take 1,300 mg by mouth 2 (two) times daily as needed for pain.  aspirin 81 MG chewable tablet Chew 81 mg by mouth daily.   carvedilol (COREG) 12.5 MG tablet Take 0.5 tablets (6.25 mg total) by mouth 2 (two) times daily with a meal.   cyanocobalamin (VITAMIN B12) 1000 MCG/ML injection as directed.   empagliflozin (JARDIANCE) 10 MG TABS tablet Take 1 tablet (10 mg total) by mouth daily before breakfast.   furosemide (LASIX) 20 MG tablet Take 0.5 tablets (10 mg total) by mouth daily.   Multiple Vitamins-Minerals (CENTRUM SILVER 50+WOMEN PO) Take 1 tablet by mouth daily.   polyethylene glycol powder (GLYCOLAX/MIRALAX) 17 GM/SCOOP powder as needed for moderate constipation.   rosuvastatin (CRESTOR) 20 MG tablet  Take 1 tablet (20 mg total) by mouth daily.   sacubitril-valsartan (ENTRESTO) 24-26 MG Take 1 tablet by mouth 2 (two) times daily.   traMADol (ULTRAM) 50 MG tablet as needed for moderate pain.   traMADol-acetaminophen (ULTRACET) 37.5-325 MG tablet as needed for severe pain.     Allergies:   Patient has no known allergies.   Social History   Socioeconomic History   Marital status: Widowed    Spouse name: Not on file   Number of children: Not on file   Years of education: Not on file   Highest education level: Not on file  Occupational History   Occupation: retired  Tobacco Use   Smoking status: Former    Current packs/day: 0.00    Average packs/day: 0.5 packs/day for 45.0 years (22.5 ttl pk-yrs)    Types: Cigarettes    Start date: 11/24/1957    Quit date: 11/25/2002    Years since quitting: 20.0   Smokeless tobacco: Never  Vaping Use   Vaping status: Never Used  Substance and Sexual Activity   Alcohol use: Yes    Comment: 07/31/2016 "I'll have a couple drinks on holidays or special occasions"   Drug use: No   Sexual activity: Not on file  Other Topics Concern   Not on file  Social History Narrative   Not on file   Social Determinants of Health   Financial Resource Strain: Not on file  Food Insecurity: Not on file  Transportation Needs: Not on file  Physical Activity: Not on file  Stress: Not on file  Social Connections: Unknown (10/08/2021)   Received from [facility]   Social Network    Social Network: Not on file     Family History: The patient's family history includes Heart failure in her mother.  ROS:   Please see the history of present illness.     EKGs/Labs/Other Studies Reviewed:    The following studies were reviewed today:  Cardiac Studies & Procedures   CARDIAC CATHETERIZATION  CARDIAC CATHETERIZATION 11/12/2021  Narrative   Ost RCA to Prox RCA lesion is 20% stenosed.   There is no aortic valve stenosis.  Nonischemic  Cardiomyopathy Angiographically tortuous but relatively normal coronary arteries with no significant disease. Very tortuous and dilated aorta from abdominal aorta through the stented thoracic aorta.  Patent right subclavian anastomosis  Essentially normal RHC pressures: RAP 8 mmHg, RVP-EDP 29/0-11 mmHg; PAP-mean 29/13-18 mmHg, PCWP 14 mmHg, LVP-EDP 158/9-16 mmHg, AOP-MAP 156/75-105 mmHg.; Ao sat 93%, PA sat 57%; Cardiac Output-Index (Fick) reduced: 4.23 - 2.49   RECOMMENDATIONS Antihypertensive management; GDMT for NICM-compensated CHF Gentle post-cath hydration but monitor for volume overload.    Bryan Lemma, MD  Findings Coronary Findings Diagnostic  Dominance: Right  Left Main Vessel was injected. Difficult to engage Vessel is large. Vessel is angiographically normal. Initial  engagement was limited AL 2 catheter, but did not fully engage, therefore after placement of a long 6 French sheath, and AL 3 catheter was not able to engage, but an XB LAD 4.0 guide catheter was directed into the left main to complete angiography.  Left Anterior Descending Vessel was injected. Vessel is large. The vessel exhibits minimal luminal irregularities. The vessel is moderately tortuous.  First Diagonal Branch Vessel is small in size.  Second Diagonal Branch Vessel is small in size.  Left Circumflex Vessel was injected. Vessel is normal in caliber. The vessel exhibits minimal luminal irregularities. The vessel is moderately tortuous.  First Obtuse Marginal Branch Vessel is moderate in size.  Right Coronary Artery Vessel was injected. Vessel is normal in caliber. Vessel is angiographically normal. There is mild focal disease in the vessel. The vessel is tortuous. Ost RCA to Prox RCA lesion is 20% stenosed.  Right Ventricular Branch Vessel is small in size.  Intervention  No interventions have been documented.     ECHOCARDIOGRAM  ECHOCARDIOGRAM COMPLETE  04/29/2022  Narrative ECHOCARDIOGRAM REPORT    Patient Name:   Brittany Greene  Date of Exam: 04/29/2022 Medical Rec #:  [id number]     Height:       64.0 in Accession #:    [id number]    Weight:       148.0 lb Date of Birth:  05/17/1941     BSA:          1.721 m Patient Age:    81 years      BP:           140/56 mmHg Patient Gender: F             HR:           55 bpm. Exam Location:  [address]  Procedure: 2D Echo, 3D Echo, Cardiac Doppler and Color Doppler  Indications:    I50.42 CHF  History:        Patient has prior history of Echocardiogram examinations, most recent 11/11/2021. CHF, CAD, PAD, Aortic Valve Disease and Mitral Valve Disease; Risk Factors:Family History of Coronary Artery Disease, Former Smoker, Dyslipidemia, Diabetes and Hypertension. Aortic Anuerysm status post Repair (2016), Left Breast Cancer with Mastectomy (2003), Chemotherapy (2004) and Left Delayed Reconstruction with Silicone Implant (2018), Heart Failure with Reduced EF (30-35%).  Sonographer:    Farrel Conners RDCS Referring Phys: [facility] Alessandria Henken  IMPRESSIONS   1. Left ventricular ejection fraction, by estimation, is 40 to 45%. The left ventricle has mildly decreased function. The left ventricle demonstrates global hypokinesis. Left ventricular diastolic parameters are consistent with Grade II diastolic dysfunction (pseudonormalization). Elevated left atrial pressure. 2. Right ventricular systolic function is normal. The right ventricular size is not well visualized. Tricuspid regurgitation signal is inadequate for assessing PA pressure. 3. Left atrial size was severely dilated. 4. Right atrial size was severely dilated. 5. The mitral valve is normal in structure. Mild mitral valve regurgitation. 6. The aortic valve is tricuspid. Aortic valve regurgitation is moderate. No aortic stenosis is present. 7. Aortic dilatation noted. There is mild dilatation of the aortic root, measuring 41 mm.  There is mild dilatation of the abdominal aorta, measuring 33 mm.  Comparison(s): Prior images reviewed side by side. The left ventricular function has improved. The left ventricle is no onger dilated. Mitral insufficiency is less significant.  FINDINGS Left Ventricle: Left ventricular ejection fraction, by estimation, is 40 to 45%. The left ventricle has mildly decreased function. The left  ventricle demonstrates global hypokinesis. 3D left ventricular ejection fraction analysis performed but not reported based on interpreter judgement due to suboptimal tracking. The left ventricular internal cavity size was normal in size. There is no left ventricular hypertrophy. Left ventricular diastolic parameters are consistent with Grade II diastolic dysfunction (pseudonormalization). Elevated left atrial pressure.  Right Ventricle: The right ventricular size is not well visualized. No increase in right ventricular wall thickness. Right ventricular systolic function is normal. Tricuspid regurgitation signal is inadequate for assessing PA pressure.  Left Atrium: Left atrial size was severely dilated.  Right Atrium: Right atrial size was severely dilated.  Pericardium: There is no evidence of pericardial effusion.  Mitral Valve: The mitral valve is normal in structure. Mild mitral valve regurgitation, with centrally-directed jet.  Tricuspid Valve: The tricuspid valve is normal in structure. Tricuspid valve regurgitation is trivial.  Aortic Valve: The aortic valve is tricuspid. Aortic valve regurgitation is moderate. Aortic regurgitation PHT measures 577 msec. No aortic stenosis is present.  Pulmonic Valve: The pulmonic valve was grossly normal. Pulmonic valve regurgitation is mild.  Aorta: Aortic dilatation noted. There is mild dilatation of the aortic root, measuring 41 mm. There is mild dilatation of the abdominal aorta, measuring 33 mm.  IAS/Shunts: There is right bowing of the interatrial septum,  suggestive of elevated left atrial pressure. No atrial level shunt detected by color flow Doppler.   LEFT VENTRICLE PLAX 2D LVIDd:         4.40 cm   Diastology LVIDs:         3.50 cm   LV e' medial:    3.00 cm/s LV PW:         0.90 cm   LV E/e' medial:  16.7 LV IVS:        1.00 cm   LV e' lateral:   4.00 cm/s LVOT diam:     2.20 cm   LV E/e' lateral: 12.5 LV SV:         83 LV SV Index:   48 LVOT Area:     3.80 cm  3D Volume EF: 3D EF:        69 % LV EDV:       160 ml LV ESV:       50 ml LV SV:        111 ml  RIGHT VENTRICLE RV Basal diam:  3.75 cm RV S prime:     13.60 cm/s TAPSE (M-mode): 2.0 cm  LEFT ATRIUM              Index        RIGHT ATRIUM           Index LA diam:        3.20 cm  1.86 cm/m   RA Area:     28.05 cm LA Vol (A2C):   104.0 ml 60.42 ml/m  RA Volume:   99.20 ml  57.63 ml/m LA Vol (A4C):   145.0 ml 84.24 ml/m LA Biplane Vol: 124.0 ml 72.04 ml/m AORTIC VALVE LVOT Vmax:   103.00 cm/s LVOT Vmean:  60.700 cm/s LVOT VTI:    0.219 m AI PHT:      577 msec  AORTA Ao Root diam: 4.10 cm Ao Asc diam:  3.40 cm  MITRAL VALVE MV Area (PHT): cm         SHUNTS MV Decel Time: 309 msec    Systemic VTI:  0.22 m MR Peak grad: 140.2 mmHg   Systemic Diam:  2.20 cm MR Mean grad: 81.0 mmHg MR Vmax:      592.00 cm/s MR Vmean:     422.0 cm/s MV E velocity: 50.15 cm/s MV A velocity: 49.45 cm/s MV E/A ratio:  1.01  Mihai Croitoru MD Electronically signed by Thurmon Fair MD Signature Date/Time: 04/29/2022/1:31:56 PM    Final              Recent Labs: 03/12/2022: NT-Pro BNP 1,647 07/05/2022: B Natriuretic Peptide >4,500.0 07/06/2022: Hemoglobin 9.9; Platelets 142 07/08/2022: Magnesium 2.2 09/19/2022: BUN 34; Creatinine, Ser 1.52; Potassium 4.8; Sodium 139  Recent Lipid Panel    Component Value Date/Time   CHOL 147 11/11/2021 0353   TRIG 81 11/11/2021 0353   HDL 50 11/11/2021 0353   CHOLHDL 2.9 11/11/2021 0353   VLDL 16 11/11/2021 0353   LDLCALC 81  11/11/2021 0353    Physical Exam:    VS:  BP 130/60   Pulse 66   Ht 5\' 4"  (1.626 m)   Wt 139 lb (63 kg)   SpO2 94%   BMI 23.86 kg/m     Wt Readings from Last 3 Encounters:  12/25/22 139 lb (63 kg)  09/19/22 140 lb (63.5 kg)  07/22/22 137 lb (62.1 kg)   Gen: No distress   Neck: No JVD Cardiac: No Rubs or Gallops, systolic and diastolic murmur, regular rhythm, +2 radial pulses Respiratory: Clear to auscultation bilaterally, normal effort, normal  respiratory rate GI: Soft, nontender, non-distended  MS: No  edema;  moves all extremities Integument: Skin feels warm Neuro:  At time of evaluation, alert and oriented to person/place/time/situation  Psych: Normal affect, patient feels well   ASSESSMENT:    1. Nonrheumatic aortic valve insufficiency   2. Thoracoabdominal aortic aneurysm (TAAA) without rupture, unspecified part (HCC)   3. Chronic combined systolic and diastolic CHF (congestive heart failure) (HCC)   4. Primary hypertension   5. Type 2 diabetes mellitus with hyperlipidemia (HCC)   6. 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  - December echo then may decrease follow up if she is also being scanned at [facility]  Mild non obstructive CAD PAD s/p L carotid-subclavian bypass and TEVAR [facility]) Prior celia artery stenosis Mixed HLD - continue ASA and BB - has made great dietary strides - continue statin 20 mg   - we discussed exercise and strength building interventions; she will continue to walk but will also start at [employer] with her daugther  One year with me   Medication Adjustments/Labs and Tests Ordered: Current medicines are reviewed at length with the patient today.  Concerns regarding medicines are outlined above.  Orders Placed This Encounter  Procedures   ECHOCARDIOGRAM COMPLETE   No orders of the defined types were placed in this encounter.   Patient Instructions  Medication Instructions:  Your physician recommends that you continue on your current medications as directed. Please refer  to the Current Medication list given to you today.  *If you need a refill on your cardiac medications before your next appointment, please call your pharmacy*   Lab Work: NONE  If you have labs (blood work) drawn today and your tests are completely normal, you will receive your results only by: MyChart Message (if you have MyChart) OR A paper copy in the mail If you have any lab test that is abnormal or we need to change your treatment, we will call you to review the results.   Testing/Procedures: DEC 2024: Your physician has requested that you have an echocardiogram. Echocardiography is a painless test that uses sound waves to create images of your heart. It provides your doctor with information about the size and shape of your heart and how well your heart's chambers and valves are working. This procedure takes approximately one hour. There are no restrictions for this procedure. Please do NOT wear cologne, perfume, aftershave, or lotions (deodorant is allowed). Please arrive 15 minutes prior to your appointment time.    Follow-Up: At [facility], you and your health needs are our priority.  As part of our continuing mission to provide you with exceptional heart care, we have created designated Provider Care Teams.  These Care Teams include your primary Cardiologist  (physician) and Advanced Practice Providers (APPs -  Physician Assistants and Nurse Practitioners) who all work together to provide you with the care you need, when you need it.  We recommend signing up for the patient portal called "MyChart".  Sign up information is provided on this After Visit Summary.  MyChart is used to connect with patients for Virtual Visits (Telemedicine).  Patients are able to view lab/test results, encounter notes, upcoming appointments, etc.  Non-urgent messages can be sent to your provider as well.   To learn more about what you can do with MyChart, go to [email or web address].    Your next appointment:   1 year(s)  Provider:   Christell Constant, MD        Signed, Christell Constant, MD  12/25/2022 11:10 AM    [facility]

## 2022-12-25 NOTE — Patient Instructions (Signed)
Medication Instructions:  Your physician recommends that you continue on your current medications as directed. Please refer to the Current Medication list given to you today.  *If you need a refill on your cardiac medications before your next appointment, please call your pharmacy*   Lab Work: NONE  If you have labs (blood work) drawn today and your tests are completely normal, you will receive your results only by: MyChart Message (if you have MyChart) OR A paper copy in the mail If you have any lab test that is abnormal or we need to change your treatment, we will call you to review the results.   Testing/Procedures: DEC 2024: Your physician has requested that you have an echocardiogram. Echocardiography is a painless test that uses sound waves to create images of your heart. It provides your doctor with information about the size and shape of your heart and how well your heart's chambers and valves are working. This procedure takes approximately one hour. There are no restrictions for this procedure. Please do NOT wear cologne, perfume, aftershave, or lotions (deodorant is allowed). Please arrive 15 minutes prior to your appointment time.    Follow-Up: At [facility], you and your health needs are our priority.  As part of our continuing mission to provide you with exceptional heart care, we have created designated Provider Care Teams.  These Care Teams include your primary Cardiologist (physician) and Advanced Practice Providers (APPs -  Physician Assistants and Nurse Practitioners) who all work together to provide you with the care you need, when you need it.  We recommend signing up for the patient portal called "MyChart".  Sign up information is provided on this After Visit Summary.  MyChart is used to connect with patients for Virtual Visits (Telemedicine).  Patients are able to view lab/test results, encounter notes, upcoming appointments, etc.  Non-urgent messages can be  sent to your provider as well.   To learn more about what you can do with MyChart, go to [email or web address].    Your next appointment:   1 year(s)  Provider:   Christell Constant, MD

## 2023-01-16 ENCOUNTER — Ambulatory Visit
Admission: RE | Admit: 2023-01-16 | Discharge: 2023-01-16 | Disposition: A | Discharge status: Home / Self Care | Payer: Medicare Other | Source: Ambulatory Visit | Attending: Nurse Practitioner | Admitting: Nurse Practitioner

## 2023-01-16 DIAGNOSIS — Z1231 Encounter for screening mammogram for malignant neoplasm of breast: Secondary | ICD-10-CM

## 2023-02-22 ENCOUNTER — Other Ambulatory Visit: Payer: Self-pay | Admitting: Internal Medicine

## 2023-03-05 ENCOUNTER — Other Ambulatory Visit: Payer: Self-pay | Admitting: Internal Medicine

## 2023-05-04 ENCOUNTER — Telehealth: Payer: Self-pay | Admitting: Internal Medicine

## 2023-05-04 ENCOUNTER — Ambulatory Visit (HOSPITAL_COMMUNITY): Payer: Medicare Other | Attending: Cardiology

## 2023-05-04 DIAGNOSIS — I10 Essential (primary) hypertension: Secondary | ICD-10-CM

## 2023-05-04 DIAGNOSIS — I351 Nonrheumatic aortic (valve) insufficiency: Secondary | ICD-10-CM | POA: Insufficient documentation

## 2023-05-04 DIAGNOSIS — Z8679 Personal history of other diseases of the circulatory system: Secondary | ICD-10-CM

## 2023-05-04 LAB — ECHOCARDIOGRAM COMPLETE
Area-P 1/2: 3.56 cm2
P 1/2 time: 540 ms
S' Lateral: 3.3 cm

## 2023-05-04 NOTE — Telephone Encounter (Signed)
Called Patient to discuss the echo findings Patient notes that she is doing fine.   Since last visit notes that [name] due to arthralgias . There are no interval hospital/ED visit.   She had a hip injection and feels better.  No chest pain or pressure .  No SOB/DOE and no PND/Orthopnea.  No weight gain or leg swelling.  No palpitations or syncope.  Reviewed LVEF has improved and is similar to last reporting at [facility] Aortic regurgitation appears worse by 2D VC measurement. - asymptomatic moderate to severe AI with LVEF mildly decreased - recommend CMR for AI quantification - please send results to Dr. Pattricia Boss at [facility] - based on her results she may get procedural evaluation here or at [facility] if she prefers - we discussed the [place] Valve as a possible less invasive fix if it is FDA approved  Non-cyclic GAD will be used; no barrires to getting CMR  [name]  Riley Lam, MD Cardiologist [facility]  [address], [address] [place], [place] [address] [phone]  4:45 PM

## 2023-05-06 NOTE — Addendum Note (Signed)
Addended by: Macie Burows on: 05/06/2023 11:46 AM   Modules accepted: Orders

## 2023-05-06 NOTE — Telephone Encounter (Signed)
Called pt advised of MD recommendation for Cardiac MRI.  Pt reports will have cataract surgery on 05/29/23.   Reviewed CMRI instructions also printed and mailed to pt for review. Pt unsure if she will have labs drawn prior to surgery.  If not pt will have CBC drawn at [facility]. Orders placed and released for draw if needed.

## 2023-05-21 ENCOUNTER — Other Ambulatory Visit: Payer: Self-pay | Admitting: Internal Medicine

## 2023-06-03 ENCOUNTER — Other Ambulatory Visit: Payer: Self-pay | Admitting: Internal Medicine

## 2023-06-09 ENCOUNTER — Other Ambulatory Visit: Payer: Self-pay | Admitting: Internal Medicine

## 2023-06-16 ENCOUNTER — Telehealth: Payer: Self-pay | Admitting: Internal Medicine

## 2023-06-16 MED ORDER — ENTRESTO 24-26 MG PO TABS: 1.0000 | ORAL_TABLET | Freq: Two times a day (BID) | ORAL | 2 refills | Status: DC

## 2023-06-16 NOTE — Telephone Encounter (Signed)
Pt's medication was sent to pt's pharmacy as requested. Confirmation received.  °

## 2023-06-16 NOTE — Telephone Encounter (Signed)
*  STAT* If patient is at the pharmacy, call can be transferred to refill team.   1. Which medications need to be refilled? (please list name of each medication and dose if known)   sacubitril-valsartan (ENTRESTO) 24-26 MG   2. Would you like to learn more about the convenience, safety, & potential cost savings by using the [facility]?   3. Are you open to using the [facility] (Type [facility]. ).  4. Which pharmacy/location (including street and city if local pharmacy) is medication to be sent to?  [id number] - [place], [place] - [address]   5. Do they need a 30 day or 90 day supply?   90 day  Patient stated she has been out of this medication for the last 4 days.  Patient wants a call back to confirm medication sent.

## 2023-07-03 ENCOUNTER — Ambulatory Visit (HOSPITAL_COMMUNITY): Payer: Medicare Other | Attending: Internal Medicine

## 2023-08-05 ENCOUNTER — Telehealth (HOSPITAL_COMMUNITY): Payer: Self-pay | Admitting: *Deleted

## 2023-08-05 NOTE — Telephone Encounter (Signed)
 Received call from patient regarding upcoming cardiac imaging study; pt verbalizes understanding of appt date/time, parking situation and where to check in, pre-test NPO status and medications ordered, and verified current allergies; name and call back number provided for further questions should they arise Johney Frame RN Navigator Cardiac Imaging [facility] [phone] office [phone] cell  Patient denies claustrophobia or metal.   Patient unable to get lab work prior to appt.

## 2023-08-05 NOTE — Telephone Encounter (Signed)
 Attempted to call patient regarding upcoming cardiac MRI appointment. Left message on voicemail with name and callback number Johney Frame RN Navigator Cardiac Imaging [facility] [phone] Office

## 2023-08-07 ENCOUNTER — Other Ambulatory Visit: Payer: Self-pay | Admitting: Internal Medicine

## 2023-08-07 ENCOUNTER — Ambulatory Visit (HOSPITAL_COMMUNITY)
Admission: RE | Admit: 2023-08-07 | Discharge: 2023-08-07 | Disposition: A | Discharge status: Home / Self Care | Payer: Medicare Other | Source: Ambulatory Visit | Attending: Internal Medicine | Admitting: Internal Medicine

## 2023-08-07 DIAGNOSIS — I10 Essential (primary) hypertension: Secondary | ICD-10-CM | POA: Insufficient documentation

## 2023-08-07 DIAGNOSIS — Z9889 Other specified postprocedural states: Secondary | ICD-10-CM | POA: Diagnosis present

## 2023-08-07 DIAGNOSIS — Z8679 Personal history of other diseases of the circulatory system: Secondary | ICD-10-CM

## 2023-08-07 DIAGNOSIS — I351 Nonrheumatic aortic (valve) insufficiency: Secondary | ICD-10-CM | POA: Insufficient documentation

## 2023-08-07 MED ORDER — GADOBUTROL 1 MMOL/ML IV SOLN
9.0000 mL | Freq: Once | INTRAVENOUS | Status: AC | PRN
  Administered 2023-08-07: 9 mL via INTRAVENOUS

## 2023-08-22 ENCOUNTER — Other Ambulatory Visit: Payer: Self-pay | Admitting: Internal Medicine

## 2023-12-11 ENCOUNTER — Other Ambulatory Visit: Payer: Self-pay | Admitting: *Deleted

## 2023-12-11 DIAGNOSIS — Z1231 Encounter for screening mammogram for malignant neoplasm of breast: Secondary | ICD-10-CM

## 2024-01-10 ENCOUNTER — Encounter (HOSPITAL_COMMUNITY): Payer: Self-pay

## 2024-01-10 ENCOUNTER — Emergency Department (HOSPITAL_COMMUNITY)

## 2024-01-10 ENCOUNTER — Inpatient Hospital Stay (HOSPITAL_COMMUNITY)
Admission: EM | Admit: 2024-01-10 | Discharge: 2024-01-18 | DRG: 565 | Disposition: A | Discharge status: Skilled Nursing Facility | Attending: Family Medicine | Admitting: Family Medicine

## 2024-01-10 DIAGNOSIS — Z7984 Long term (current) use of oral hypoglycemic drugs: Secondary | ICD-10-CM

## 2024-01-10 DIAGNOSIS — S8991XA Unspecified injury of right lower leg, initial encounter: Secondary | ICD-10-CM

## 2024-01-10 DIAGNOSIS — Z952 Presence of prosthetic heart valve: Secondary | ICD-10-CM

## 2024-01-10 DIAGNOSIS — N1832 Chronic kidney disease, stage 3b: Secondary | ICD-10-CM | POA: Diagnosis present

## 2024-01-10 DIAGNOSIS — E1122 Type 2 diabetes mellitus with diabetic chronic kidney disease: Secondary | ICD-10-CM | POA: Diagnosis present

## 2024-01-10 DIAGNOSIS — E7800 Pure hypercholesterolemia, unspecified: Secondary | ICD-10-CM | POA: Diagnosis present

## 2024-01-10 DIAGNOSIS — Z9221 Personal history of antineoplastic chemotherapy: Secondary | ICD-10-CM

## 2024-01-10 DIAGNOSIS — M1712 Unilateral primary osteoarthritis, left knee: Secondary | ICD-10-CM | POA: Diagnosis present

## 2024-01-10 DIAGNOSIS — Z751 Person awaiting admission to adequate facility elsewhere: Secondary | ICD-10-CM

## 2024-01-10 DIAGNOSIS — W19XXXA Unspecified fall, initial encounter: Principal | ICD-10-CM

## 2024-01-10 DIAGNOSIS — D631 Anemia in chronic kidney disease: Secondary | ICD-10-CM | POA: Diagnosis present

## 2024-01-10 DIAGNOSIS — I5042 Chronic combined systolic (congestive) and diastolic (congestive) heart failure: Secondary | ICD-10-CM | POA: Diagnosis present

## 2024-01-10 DIAGNOSIS — Z7982 Long term (current) use of aspirin: Secondary | ICD-10-CM

## 2024-01-10 DIAGNOSIS — M25569 Pain in unspecified knee: Secondary | ICD-10-CM | POA: Diagnosis present

## 2024-01-10 DIAGNOSIS — N1831 Chronic kidney disease, stage 3a: Secondary | ICD-10-CM | POA: Diagnosis present

## 2024-01-10 DIAGNOSIS — M25462 Effusion, left knee: Secondary | ICD-10-CM | POA: Diagnosis not present

## 2024-01-10 DIAGNOSIS — Z79899 Other long term (current) drug therapy: Secondary | ICD-10-CM

## 2024-01-10 DIAGNOSIS — S83262A Peripheral tear of lateral meniscus, current injury, left knee, initial encounter: Principal | ICD-10-CM | POA: Diagnosis present

## 2024-01-10 DIAGNOSIS — I351 Nonrheumatic aortic (valve) insufficiency: Secondary | ICD-10-CM | POA: Diagnosis present

## 2024-01-10 DIAGNOSIS — R2689 Other abnormalities of gait and mobility: Secondary | ICD-10-CM | POA: Diagnosis present

## 2024-01-10 DIAGNOSIS — D649 Anemia, unspecified: Secondary | ICD-10-CM

## 2024-01-10 DIAGNOSIS — I130 Hypertensive heart and chronic kidney disease with heart failure and stage 1 through stage 4 chronic kidney disease, or unspecified chronic kidney disease: Secondary | ICD-10-CM | POA: Diagnosis present

## 2024-01-10 DIAGNOSIS — Z860100 Personal history of colon polyps, unspecified: Secondary | ICD-10-CM

## 2024-01-10 DIAGNOSIS — I429 Cardiomyopathy, unspecified: Secondary | ICD-10-CM | POA: Diagnosis present

## 2024-01-10 DIAGNOSIS — Z789 Other specified health status: Secondary | ICD-10-CM

## 2024-01-10 DIAGNOSIS — W06XXXA Fall from bed, initial encounter: Secondary | ICD-10-CM | POA: Diagnosis present

## 2024-01-10 DIAGNOSIS — Y92003 Bedroom of unspecified non-institutional (private) residence as the place of occurrence of the external cause: Secondary | ICD-10-CM

## 2024-01-10 DIAGNOSIS — S83269A Peripheral tear of lateral meniscus, current injury, unspecified knee, initial encounter: Secondary | ICD-10-CM

## 2024-01-10 DIAGNOSIS — Z853 Personal history of malignant neoplasm of breast: Secondary | ICD-10-CM

## 2024-01-10 DIAGNOSIS — S83206A Unspecified tear of unspecified meniscus, current injury, right knee, initial encounter: Secondary | ICD-10-CM

## 2024-01-10 LAB — COMPREHENSIVE METABOLIC PANEL WITH GFR
ALT: 8 U/L (ref 0–44)
AST: 17 U/L (ref 15–41)
Albumin: 2.6 g/dL — ABNORMAL LOW (ref 3.5–5.0)
Alkaline Phosphatase: 55 U/L (ref 38–126)
Anion gap: 9 (ref 5–15)
BUN: 24 mg/dL — ABNORMAL HIGH (ref 8–23)
CO2: 21 mmol/L — ABNORMAL LOW (ref 22–32)
Calcium: 8.3 mg/dL — ABNORMAL LOW (ref 8.9–10.3)
Chloride: 108 mmol/L (ref 98–111)
Creatinine, Ser: 1.52 mg/dL — ABNORMAL HIGH (ref 0.44–1.00)
GFR, Estimated: 34 mL/min — ABNORMAL LOW (ref >60)
Glucose, Bld: 126 mg/dL — ABNORMAL HIGH (ref 70–99)
Potassium: 3.6 mmol/L (ref 3.5–5.1)
Sodium: 138 mmol/L (ref 135–145)
Total Bilirubin: 0.8 mg/dL (ref 0.0–1.2)
Total Protein: 6.4 g/dL — ABNORMAL LOW (ref 6.5–8.1)

## 2024-01-10 LAB — CBC WITH DIFFERENTIAL/PLATELET
Abs Immature Granulocytes: 0.02 K/uL (ref 0.00–0.07)
Basophils Absolute: 0 K/uL (ref 0.0–0.1)
Basophils Relative: 0 %
Eosinophils Absolute: 0.1 K/uL (ref 0.0–0.5)
Eosinophils Relative: 2 %
HCT: 26.9 % — ABNORMAL LOW (ref 36.0–46.0)
Hemoglobin: 8.1 g/dL — ABNORMAL LOW (ref 12.0–15.0)
Immature Granulocytes: 0 %
Lymphocytes Relative: 15 %
Lymphs Abs: 1 K/uL (ref 0.7–4.0)
MCH: 24.8 pg — ABNORMAL LOW (ref 26.0–34.0)
MCHC: 30.1 g/dL (ref 30.0–36.0)
MCV: 82.5 fL (ref 80.0–100.0)
Monocytes Absolute: 0.8 K/uL (ref 0.1–1.0)
Monocytes Relative: 11 %
Neutro Abs: 5.1 K/uL (ref 1.7–7.7)
Neutrophils Relative %: 72 %
Platelets: 175 K/uL (ref 150–400)
RBC: 3.26 MIL/uL — ABNORMAL LOW (ref 3.87–5.11)
RDW: 17 % — ABNORMAL HIGH (ref 11.5–15.5)
WBC: 7.1 K/uL (ref 4.0–10.5)
nRBC: 0 % (ref 0.0–0.2)

## 2024-01-10 LAB — TYPE AND SCREEN
ABO/RH(D): O POS
Antibody Screen: NEGATIVE

## 2024-01-10 MED ORDER — ACETAMINOPHEN 650 MG RE SUPP: 650.0000 mg | Freq: Four times a day (QID) | RECTAL | Status: DC | PRN

## 2024-01-10 MED ORDER — FUROSEMIDE 20 MG PO TABS
10.0000 mg | ORAL_TABLET | Freq: Every day | ORAL | Status: DC
  Administered 2024-01-11 – 2024-01-18 (×8): 10 mg via ORAL
  Filled 2024-01-10 (×8): qty 1

## 2024-01-10 MED ORDER — ASPIRIN 81 MG PO CHEW
81.0000 mg | CHEWABLE_TABLET | Freq: Every day | ORAL | Status: DC
  Administered 2024-01-11 – 2024-01-18 (×8): 81 mg via ORAL
  Filled 2024-01-10 (×8): qty 1

## 2024-01-10 MED ORDER — OXYCODONE HCL 5 MG PO TABS: 2.5000 mg | ORAL_TABLET | ORAL | Status: DC | PRN

## 2024-01-10 MED ORDER — RAMELTEON 8 MG PO TABS
8.0000 mg | ORAL_TABLET | Freq: Every day | ORAL | Status: AC
  Administered 2024-01-10: 8 mg via ORAL
  Filled 2024-01-10: qty 1

## 2024-01-10 MED ORDER — RAMELTEON 8 MG PO TABS
8.0000 mg | ORAL_TABLET | Freq: Every day | ORAL | Status: DC
  Filled 2024-01-10: qty 1

## 2024-01-10 MED ORDER — OXYCODONE-ACETAMINOPHEN 5-325 MG PO TABS
1.0000 | ORAL_TABLET | Freq: Once | ORAL | Status: AC
  Administered 2024-01-10: 1 via ORAL
  Filled 2024-01-10: qty 1

## 2024-01-10 MED ORDER — OXYCODONE HCL 5 MG PO TABS
2.5000 mg | ORAL_TABLET | ORAL | Status: DC | PRN
  Administered 2024-01-11 – 2024-01-14 (×4): 2.5 mg via ORAL
  Filled 2024-01-10 (×4): qty 1

## 2024-01-10 MED ORDER — INSULIN ASPART 100 UNIT/ML IJ SOLN
0.0000 [IU] | Freq: Three times a day (TID) | INTRAMUSCULAR | Status: DC
  Administered 2024-01-11: 1 [IU] via SUBCUTANEOUS
  Administered 2024-01-11: 0 [IU] via SUBCUTANEOUS
  Administered 2024-01-12 – 2024-01-14 (×3): 1 [IU] via SUBCUTANEOUS
  Administered 2024-01-17: 2 [IU] via SUBCUTANEOUS

## 2024-01-10 MED ORDER — ACETAMINOPHEN 500 MG PO TABS: 1000.0000 mg | ORAL_TABLET | Freq: Four times a day (QID) | ORAL | Status: DC | PRN

## 2024-01-10 MED ORDER — SACUBITRIL-VALSARTAN 24-26 MG PO TABS
1.0000 | ORAL_TABLET | Freq: Two times a day (BID) | ORAL | Status: DC
  Administered 2024-01-11 – 2024-01-18 (×15): 1 via ORAL
  Filled 2024-01-10 (×16): qty 1

## 2024-01-10 MED ORDER — MORPHINE SULFATE (PF) 4 MG/ML IV SOLN
4.0000 mg | Freq: Once | INTRAVENOUS | Status: AC
  Administered 2024-01-10: 4 mg via INTRAVENOUS
  Filled 2024-01-10: qty 1

## 2024-01-10 MED ORDER — ENOXAPARIN SODIUM 30 MG/0.3ML IJ SOSY
30.0000 mg | PREFILLED_SYRINGE | INTRAMUSCULAR | Status: DC
  Administered 2024-01-10 – 2024-01-17 (×8): 30 mg via SUBCUTANEOUS
  Filled 2024-01-10 (×8): qty 0.3

## 2024-01-10 MED ORDER — ACETAMINOPHEN 325 MG PO TABS
650.0000 mg | ORAL_TABLET | Freq: Four times a day (QID) | ORAL | Status: DC | PRN
  Administered 2024-01-10: 650 mg via ORAL
  Filled 2024-01-10 (×2): qty 2

## 2024-01-10 MED ORDER — EMPAGLIFLOZIN 10 MG PO TABS
10.0000 mg | ORAL_TABLET | Freq: Every day | ORAL | Status: DC
  Administered 2024-01-11 – 2024-01-18 (×8): 10 mg via ORAL
  Filled 2024-01-10 (×8): qty 1

## 2024-01-10 MED ORDER — CARVEDILOL 6.25 MG PO TABS
6.2500 mg | ORAL_TABLET | Freq: Two times a day (BID) | ORAL | Status: DC
  Administered 2024-01-10 – 2024-01-18 (×15): 6.25 mg via ORAL
  Filled 2024-01-10 (×15): qty 1

## 2024-01-10 MED ORDER — ROSUVASTATIN CALCIUM 20 MG PO TABS
20.0000 mg | ORAL_TABLET | Freq: Every day | ORAL | Status: DC
  Administered 2024-01-11 – 2024-01-18 (×8): 20 mg via ORAL
  Filled 2024-01-10 (×8): qty 1

## 2024-01-10 NOTE — H&P (Cosign Needed Addendum)
 Hospital Admission History and Physical Service Pager: [phone]  Patient name: Brittany Greene Medical record number: [id number] Date of Birth: 1940/08/09 Age: 83 y.o. Gender: female  Primary Care Provider: Suzen CHRISTELLA Ivory, NP  Consultants: none Code Status: Full which was confirmed with patient  Preferred Emergency Contact: Gerald Honea - daughter - [phone]  Chief Complaint: R knee pain  Differential and Medical Decision Making:  Nada Godley is a 83 y.o. female presenting with R knee pain and effusion s/p mechanical fall at home last night. Admitted for pain control, she was unable to ambulate out of the ED. This is likely msk strain from fall combined with OA exacerbation Differential for this patient's presentation of this includes septic arthritis, patellar fracture, meniscal tear, patellar tendinopathy, and gout. Septic arthritis is less likely given the absence of fever, systemic symptoms, or significant erythema or warmth of the joint. Patellar fracture is unlikely as radiographs confirmed no acute fracture. Cartilage tears are less likely without mechanical symptoms such as locking and catching. Patellar tendinopathy is less likely as it typically presents with chronic anterior knee pain and this is related to recent trauma. Gout is less likely given no prior history, no tophi, no erythema or warmth of the knee. Assessment & Plan Knee pain Secondary to mechanical fall from level of bed onto knees. No head injury or LOC.  - Admit to MedSurg under Dr. Madelon  - Pain management with 650mg  Tylenol  q6h PRN for mild pain; 2.5mg  oxycodone  q4h PRN for moderate pain - Regular diet  - PT eval appreciated - Encourage mobility - Knee stability brace and ace wrap for comfort  Anemia Hgb 8.1, baseline 9-10 - am CBC - consider repeat iron panel Chronic health problem Chronic combined systolic and diastolic CHF: continue home furosemide  10mg  daily, home carvedilol  6.25mg  BID, home  Jardiance  10mg  daily, and Entresto  24-26mg  BID HLD: continue home rosuvastatin  20mg    FEN/GI: regular diet  VTE Prophylaxis: Lovenox   Disposition: Admit   History of Present Illness:  Brittany Greene is a 83 y.o. female presenting with her daughters and granddaughter at bedside with primary complaint of right knee pain and swelling x 1 day.  Patient states she was getting off of her bed last night when her right leg gave out and she fell directly on her knees.  She denies hitting her head or any LOC.  She states there is no prodrome or weakness prior to the event, her leg just gave out.  She denies any chest pain, shortness of breath, palpitations, dizziness, lightheadedness, presyncope, extremity sensation deficits, dysuria, hematuria, or any other complaints at this time.  She lives downstairs in a townhome with her daughter who lives upstairs.  In the ED, right knee x-ray and right knee CT performed significant for a moderate joint effusion.  CT cervical spine, CT head, pelvis x-ray, chest x-ray, femur x-ray all unremarkable.  Patient received 09/26/2023 Percocet, 4 mg morphine  in ED with relief.  Review Of Systems: Per HPI   Pertinent Past Medical History: Reviewed in history tab.   Pertinent Past Surgical History: Reviewed in history tab.   Pertinent Social History: Tobacco use: No Alcohol use: No Other Substance use: No Lives with daughter  Pertinent Family History: none  Important Outpatient Medications: ASA 81mg  Coreg  12.5mg  Jardiance  10mg   Lasix  10mg  Robaxin  500mg   Crestor  20mg  Entresto  24-26mg  Ultram 50mg   Objective: BP (!) 147/68   Pulse 67   Temp 98 F (36.7 C) (Oral)   Resp 18  Ht 5' 4 (1.626 m)   Wt 61.2 kg   SpO2 100%   BMI 23.17 kg/m  Exam: General: A&O x3, NAD, lying supine comfortably in bed speaking with daughters without difficulty Eyes: EOMI, non-icteric ENNTM: full ROM of neck, no trauma appreciated  Cardiovascular: RRR, no  m/r/g Respiratory: CTAB, normal WOB, no w/r/r Gastrointestinal: soft, non-tender, non-distended, BS present MSK: ace wrap and stability brace wrapped around RLE and knee, mild TTP of the anterior knee, visibile moderate edema of the R knee Neuro: no sensation deficits, full ROM of R toes/ankle, CN II-XII grossly intact Psych: Normal affect and mood  Labs:  CBC BMET  Recent Labs  Lab 01/10/24 1200  WBC 7.1  HGB 8.1*  HCT 26.9*  PLT 175   Recent Labs  Lab 01/10/24 1113  NA 138  K 3.6  CL 108  CO2 21*  BUN 24*  CREATININE 1.52*  GLUCOSE 126*  CALCIUM  8.3*     Imaging Studies Performed:  Imaging Study: CT right knee Impression from Radiologist:  1. No acute displaced fracture or dislocation. 2. At least moderate tricompartmental degenerative changes of the knee. 3. Moderate joint effusion. 4. Atherosclerotic plaque.   Imaging Study: right knee x-ray Impression from Radiologist:  1. No acute fracture or subluxation. 2. Moderate-sized suprapatellar joint effusion. 3. 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   Imaging Study: CT cervical spine Impression from Radiologist:  1. No acute abnormality of the cervical spine related to the reported neck trauma. 2. 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    Imaging Study: CT head Impression from Radiologist:    Imaging Study: Pelvis x-ray Impression from Radiologist:  No fracture.  Imaging Study: Chest x-ray Impression from Radiologist: 1. No acute intrathoracic process. 2. Stable cardiomegaly and thoracic aortic endoluminal stent graft repair.1. No acute intrathoracic process. 2. Stable cardiomegaly and thoracic aortic endoluminal stent graft repair.   Imaging Study: Femur x-ray Impression from Radiologist:  No fracture or dislocation.    Lupie Credit, DO 01/10/2024, 4:37 PM PGY-1, [facility]  FPTS Intern pager: [phone], text pages welcome Secure chat group [facility] Teaching Service    Upper Level Addendum: I have seen and evaluated this patient along with Dr. Lupie and reviewed the above note, making necessary revisions as appropriate. I agree with the medical decision making and physical exam as noted above. Elyce Prescott, DO PGY-2 [facility] Residency

## 2024-01-10 NOTE — Progress Notes (Signed)
 Orthopedic Tech Progress Note Patient Details:  Brittany Greene 06-11-1940 [id number]  Ortho Devices Type of Ortho Device: Ace wrap, Knee Immobilizer Ortho Device/Splint Location: RLE Ortho Device/Splint Interventions: Ordered, Application, Adjustment   Post Interventions Patient Tolerated: Fair Instructions Provided: Care of device, Adjustment of device  Chaniqua Brisby Ronal Brasil 01/10/2024, 3:26 PM

## 2024-01-10 NOTE — Assessment & Plan Note (Signed)
 Hgb 8.1, baseline 9-10 - am CBC - consider repeat iron panel

## 2024-01-10 NOTE — Hospital Course (Addendum)
 Brittany Greene is an 83yo F admitted for pain control of R knee pain and effusion s/p mechanical fall. Her hospital course is outlined below:  Knee pain Presented initially after a fall getting out of bed.  Imaging of right femur, right knee, pelvis, chest without fracture.  CT head negative.  CT cervical spine negative.  CT right knee with moderate OA and moderate joint effusion. MRI knee showed medial meniscus tear, osteoarthritis, and joint effusion. Orthopedic Surgery consulted, they recommended joint aspiration, injection, pain control and PT/OT. Additionally, they recommend outpatient follow-up to discuss non-operative versus operative options. She received joint aspiration and steroid knee injection 8/20 with some improvement of pain.  Pain controlled with tylenol  and oxycodone . PT/OT evaluated and recommended SNF placement for acute physical therapy.  Follow up recommendations for PCP Consider workup of chronic anemia/repeat iron studies Ortho follow-up with [facility]oscopy h/o polyps - was due for repeat in 2017 D/c with oxycodone  for pain management, take as needed, not a long term medication

## 2024-01-10 NOTE — Assessment & Plan Note (Signed)
 Chronic combined systolic and diastolic CHF: continue home furosemide  10mg  daily, home carvedilol  6.25mg  BID, home Jardiance  10mg  daily, and Entresto  24-26mg  BID HLD: continue home rosuvastatin  20mg 

## 2024-01-10 NOTE — ED Provider Notes (Signed)
 Brittany Greene EMERGENCY DEPARTMENT AT [facility] Provider Note   CSN: [id number] Arrival date & time: 01/10/24  1022     Patient presents with: Brittany Greene is a 83 y.o. female.   83 year old female with prior medical history as detailed below presents for evaluation.  Patient reports that she fell last night while trying to get out of bed.  She did hit her right knee.  She complains of right knee pain today.  She denies other specific injury or complaint related to the fall itself.  She came in this morning secondary to continued knee pain.  The history is provided by the patient and medical records.       Prior to Admission medications   Medication Sig Start Date End Date Taking? Authorizing Provider  acetaminophen  (TYLENOL ) 650 MG CR tablet Take 1,300 mg by mouth 2 (two) times daily as needed for pain.    [provider]  aspirin  81 MG chewable tablet Chew 81 mg by mouth daily.    [provider]  carvedilol  (COREG ) 12.5 MG tablet Take 0.5 tablets (6.25 mg total) by mouth 2 (two) times daily with a meal. 08/24/23   Chandrasekhar, Mahesh A, MD  cyanocobalamin (VITAMIN B12) 1000 MCG/ML injection as directed. 09/17/22   [provider]  empagliflozin  (JARDIANCE ) 10 MG TABS tablet Take 1 tablet (10 mg total) by mouth daily before breakfast. 07/22/22   Chandrasekhar, Mahesh A, MD  furosemide  (LASIX ) 20 MG tablet TAKE 1/2 TABLET BY MOUTH DAILY 05/21/23   Chandrasekhar, Mahesh A, MD  Multiple Vitamins-Minerals (CENTRUM SILVER 50+WOMEN PO) Take 1 tablet by mouth daily.    [provider]  polyethylene glycol powder (GLYCOLAX /MIRALAX ) 17 GM/SCOOP powder as needed for moderate constipation. 01/29/18   [provider]  rosuvastatin  (CRESTOR ) 20 MG tablet TAKE 1 TABLET BY MOUTH EVERY DAY 08/24/23   Chandrasekhar, Mahesh A, MD  sacubitril -valsartan  (ENTRESTO ) 24-26 MG Take 1 tablet by mouth 2 (two) times daily. 06/16/23   Chandrasekhar,  Stanly LABOR, MD  traMADol (ULTRAM) 50 MG tablet as needed for moderate pain. 09/18/22   [provider]  traMADol-acetaminophen  (ULTRACET) 37.5-325 MG tablet as needed for severe pain. 12/15/16   [provider]    Allergies: Patient has no known allergies.    Review of Systems  All other systems reviewed and are negative.   Updated Vital Signs BP 126/68   Pulse 87   Temp 97.9 F (36.6 C) (Oral)   Resp (!) 27   Ht 5' 4 (1.626 m)   Wt 61.2 kg   SpO2 100%   BMI 23.17 kg/m   Physical Exam Vitals and nursing note reviewed.  Constitutional:      General: She is not in acute distress.    Appearance: Normal appearance. She is well-developed.  HENT:     Head: Normocephalic and atraumatic.  Eyes:     Conjunctiva/sclera: Conjunctivae normal.     Pupils: Pupils are equal, round, and reactive to light.  Cardiovascular:     Rate and Rhythm: Normal rate and regular rhythm.     Heart sounds: Normal heart sounds.  Pulmonary:     Effort: Pulmonary effort is normal. No respiratory distress.     Breath sounds: Normal breath sounds.  Abdominal:     General: There is no distension.     Palpations: Abdomen is soft.     Tenderness: There is no abdominal tenderness.  Musculoskeletal:        General:  Tenderness and signs of injury present. No deformity. Normal range of motion.     Cervical back: Normal range of motion and neck supple.     Comments: Right knee with moderate effusion present.  Diffuse anterior tenderness present with palpation.  No instability noted.  Distal right lower extremity is neurovascular intact.  Skin:    General: Skin is warm and dry.  Neurological:     General: No focal deficit present.     Mental Status: She is alert and oriented to person, place, and time.     (all labs ordered are listed, but only abnormal results are displayed) Labs Reviewed  COMPREHENSIVE METABOLIC PANEL WITH GFR - Abnormal; Notable for the following components:       Result Value   CO2 21 (*)    Glucose, Bld 126 (*)    BUN 24 (*)    Creatinine, Ser 1.52 (*)    Calcium  8.3 (*)    Total Protein 6.4 (*)    Albumin 2.6 (*)    GFR, Estimated 34 (*)    All other components within normal limits  CBC WITH DIFFERENTIAL/PLATELET - Abnormal; Notable for the following components:   RBC 3.26 (*)    Hemoglobin 8.1 (*)    HCT 26.9 (*)    MCH 24.8 (*)    RDW 17.0 (*)    All other components within normal limits  CBC WITH DIFFERENTIAL/PLATELET  TYPE AND SCREEN    EKG: EKG Interpretation Date/Time:  Sunday January 10 2024 12:40:05 EDT Ventricular Rate:  59 PR Interval:  255 QRS Duration:  112 QT Interval:  466 QTC Calculation: 462 R Axis:   -48  Text Interpretation: Sinus or ectopic atrial rhythm Prolonged PR interval LAD, consider left anterior fascicular block LVH with secondary repolarization abnormality Anterior Q waves, possibly due to LVH Confirmed by Laurice Coy [phone]) on 01/10/2024 12:43:07 PM  Radiology: CT Knee Right Wo Contrast Result Date: 01/10/2024 CLINICAL DATA:  Knee trauma, occult fracture suspected, xray done EXAM: CT OF THE RIGHT KNEE WITHOUT CONTRAST TECHNIQUE: Multidetector CT imaging of the right knee was performed according to the standard protocol. Multiplanar CT image reconstructions were also generated. RADIATION DOSE REDUCTION: This exam was performed according to the departmental dose-optimization program which includes automated exposure control, adjustment of the mA and/or kV according to patient size and/or use of iterative reconstruction technique. COMPARISON:  X-ray right knee 01/10/2024. FINDINGS: Bones/Joint/Cartilage Diffusely decreased bone density. No acute fracture. Moderate volume joint effusion. At least moderate tricompartmental degenerative changes of the knee. No aggressive appearing focal bone abnormality. Soft tissues are unremarkable. Ligaments Suboptimally assessed by CT. Muscles and Tendons Grossly unremarkable.  Soft tissues Atherosclerotic plaque. IMPRESSION: 1. No acute displaced fracture or dislocation. 2. At least moderate tricompartmental degenerative changes of the knee. 3. Moderate joint effusion. 4. Atherosclerotic plaque. Electronically Signed   By: Morgane  Naveau M.D.   On: 01/10/2024 13:29   CT Cervical Spine Wo Contrast Result Date: 01/10/2024 EXAM: CT CERVICAL SPINE WITHOUT CONTRAST 01/10/2024 11:49:37 AM TECHNIQUE: CT of the cervical spine was performed without the administration of intravenous contrast. Multiplanar reformatted images are provided for review. Automated exposure control, iterative reconstruction, and/or weight based adjustment of the mA/kV was utilized to reduce the radiation dose to as low as reasonably achievable. COMPARISON: None available. CLINICAL HISTORY: Neck trauma (Age >= 65y). FINDINGS: CERVICAL SPINE: BONES AND ALIGNMENT: No acute fracture or traumatic malalignment. DEGENERATIVE CHANGES: Multilevel degenerative changes are present in the cervical spine. Moderate-to-severe foraminal  stenosis is present bilaterally at C3-4, C4-5, and C5-6. SOFT TISSUES: No prevertebral soft tissue swelling. Atherosclerotic calcifications are present. Partial thyroidectomy noted. Aortic stent graft is in place. IMPRESSION: 1. No acute abnormality of the cervical spine related to the reported neck trauma. 2. 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 Electronically signed by: Lonni Necessary MD 01/10/2024 12:15 PM EDT RP Workstation: [id number]   DG Chest 1 View Result Date: 01/10/2024 CLINICAL DATA:  [place]lem[place] yesterday EXAM: CHEST  1 VIEW COMPARISON:  07/05/2022 FINDINGS: Single frontal view of the chest demonstrates stable postsurgical changes from median sternotomy and thoracic aorta endoluminal stent graft. Cardiac silhouette is enlarged but stable. No acute airspace disease, effusion, or pneumothorax.  No acute displaced fracture. IMPRESSION: 1. No acute intrathoracic process. 2. Stable cardiomegaly and thoracic aortic endoluminal stent graft repair. Electronically Signed   By: Ozell Daring M.D.   On: 01/10/2024 12:09   CT Head Wo Contrast Result Date: 01/10/2024 EXAM: CT HEAD WITHOUT CONTRAST 01/10/2024 11:49:37 AM TECHNIQUE: CT of the head was performed without the administration of intravenous contrast. Automated exposure control, iterative reconstruction, and/or weight based adjustment of the mA/kV was utilized to reduce the radiation dose to as low as reasonably achievable. COMPARISON: None available. CLINICAL HISTORY: Head trauma, minor (Age >= 65y). FINDINGS: BRAIN AND VENTRICLES: No acute hemorrhage. Gray-white differentiation is preserved. No hydrocephalus. No extra-axial collection. No mass effect or midline shift. Periventricular and subcortical white matter hypoattenuation is mildly advanced for age. ORBITS: No acute abnormality. SINUSES: Left maxillary antrostomy and partial ethmoidectomy noted. Residual diffuse mucosal thickening present within the left maxillary sinus and ethmoid air cells. Fluid is present in the left frontal sinus. Secretions are present in the left sphenoid sinus. SOFT TISSUES AND SKULL: No acute soft tissue abnormality. No skull fracture. Mastoid air cells are clear. IMPRESSION: 1. No acute intracranial abnormality related to minor head trauma. 2. Mildly advanced periventricular and subcortical white matter hypoattenuation for age. 3. Post-surgical changes in the left maxillary sinus and ethmoid air cells with residual mucosal thickening. Fluid in the left frontal sinus and secretions in the left sphenoid sinus. Electronically signed by: Lonni Necessary MD 01/10/2024 12:07 PM EDT RP Workstation: [id number]   DG Pelvis 1-2 Views Result Date: 01/10/2024 CLINICAL DATA:  Ground level fall EXAM: PELVIS - 1-2 VIEW COMPARISON:  None Available. FINDINGS: There is no evidence  of pelvic fracture or diastasis. No pelvic bone lesions are seen. IMPRESSION: No fracture Electronically Signed   By: Jackquline Boxer M.D.   On: 01/10/2024 12:06   DG Knee Complete 4 Views Right Result Date: 01/10/2024 EXAM: 4 VIEW(S) XRAY OF THE RIGHT KNEE 01/10/2024 11:47:00 AM COMPARISON: None available. CLINICAL HISTORY: Fall. Per ED triage notes: Pt BIB [facility] from home for a ground level fall last night, trying to get out of bed, right knee gave out. Denies hitting head, LOC, c/o right hip and knee pain. 100 mcg fentanyl  PTA; ; 120/48; HR 60; 98% ; CBG 153; Best obtainable images due to pt condition. FINDINGS: BONES AND JOINTS: No signs of acute fracture or subluxation. 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 Moderate-sized suprapatellar joint effusion. SOFT TISSUES: Vascular calcifications noted. IMPRESSION: 1. No acute fracture or subluxation. 2. Moderate-sized suprapatellar joint effusion. 3. 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 Electronically signed by: Waddell Calk MD 01/10/2024 12:03 PM EDT RP Workstation: [id number]   DG Femur Min 2 Views Right Result Date: 01/10/2024 CLINICAL DATA:  Ground level fall EXAM: RIGHT FEMUR  2 VIEWS COMPARISON:  None Available. FINDINGS: There is no evidence of fracture or other focal bone lesions. Soft tissues are unremarkable. IMPRESSION: No fracture or dislocation Electronically Signed   By: Jackquline Boxer M.D.   On: 01/10/2024 12:00     Procedures   Medications Ordered in the ED  oxyCODONE -acetaminophen  (PERCOCET/ROXICET) 5-325 MG per tablet 1 tablet (has no administration in time range)  morphine  (PF) 4 MG/ML injection 4 mg (4 mg Intravenous Given 01/10/24 1108)                                    Medical Decision Making Patient with fall from her bed last night.  She injured her right knee in the fall.  Imaging does not  reveal fracture.  She does have a right knee effusion.  Despite splinting, pain medication patient is  still not able to ambulate comfortably.  She appears to be a fall risk at discharge at this time.  Case discussed with family medicine teaching team.  They will evaluate for admission.  Amount and/or Complexity of Data Reviewed Labs: ordered. Radiology: ordered.  Risk Prescription drug management. Decision regarding hospitalization.        Final diagnoses:  Fall, initial encounter  Injury of right knee, initial encounter    ED Discharge Orders     None          Laurice Maude BROCKS, MD 01/10/24 1528

## 2024-01-10 NOTE — Plan of Care (Signed)
 FMTS Brief Progress Note  S:Patient has improved pain in the right knee. She is wearing the immobilizer and is comfortable. No other concerns at this time.   O: BP 127/73 (BP Location: Right Arm)   Pulse 73   Temp 98 F (36.7 C)   Resp 17   Ht 5' 4 (1.626 m)   Wt 61.2 kg   SpO2 98%   BMI 23.17 kg/m    Neurovascularly intact in the right foot. Knee immobilizer overlying ace wrap in place. No peripheral edema.  A/P: Traumatic right knee pain after fall onto knee. No acute bony abnormalities.  Pain management with PRN Tylenol  for mild and moderate and Oxycodone  for severe pain.  - Orders reviewed. Labs for AM CBC ordered.   HTN well controlled, CHF: entresto  BID, coreg  6.25 mg BID, jardiance  10 mg daily T2DM: Last A1C 5.7 in 2023, sensitive SSI added  Alena Morrison, [place], MD 01/10/2024, 9:24 PM PGY-1, [facility] Night Resident  Please page [phone] with questions.

## 2024-01-10 NOTE — ED Triage Notes (Addendum)
 Pt BIB [facility] from home for a ground level fall last night, trying to get out of bed, right knee gave out. Denies hitting head, LOC, c/o right hip and knee pain. 100 mcg fentanyl  PTA  120/48 HR 60 98%  CBG 153

## 2024-01-10 NOTE — Assessment & Plan Note (Signed)
 Secondary to mechanical fall from level of bed onto knees. No head injury or LOC.  - Admit to MedSurg under Dr. Rumball  - Pain management with 650mg  Tylenol  q6h PRN for mild pain; 2.5mg  oxycodone  q4h PRN for moderate pain - Regular diet  - PT eval appreciated - Encourage mobility - Knee stability brace and ace wrap for comfort

## 2024-01-11 ENCOUNTER — Observation Stay (HOSPITAL_COMMUNITY)

## 2024-01-11 DIAGNOSIS — S8991XA Unspecified injury of right lower leg, initial encounter: Secondary | ICD-10-CM

## 2024-01-11 DIAGNOSIS — D649 Anemia, unspecified: Secondary | ICD-10-CM | POA: Diagnosis not present

## 2024-01-11 DIAGNOSIS — M25561 Pain in right knee: Secondary | ICD-10-CM

## 2024-01-11 DIAGNOSIS — W19XXXA Unspecified fall, initial encounter: Secondary | ICD-10-CM

## 2024-01-11 LAB — GLUCOSE, CAPILLARY
Glucose-Capillary: 146 mg/dL — ABNORMAL HIGH (ref 70–99)
Glucose-Capillary: 102 mg/dL — ABNORMAL HIGH (ref 70–99)
Glucose-Capillary: 104 mg/dL — ABNORMAL HIGH (ref 70–99)
Glucose-Capillary: 123 mg/dL — ABNORMAL HIGH (ref 70–99)

## 2024-01-11 LAB — CBC
HCT: 27.5 % — ABNORMAL LOW (ref 36.0–46.0)
Hemoglobin: 8.6 g/dL — ABNORMAL LOW (ref 12.0–15.0)
MCH: 24.9 pg — ABNORMAL LOW (ref 26.0–34.0)
MCHC: 31.3 g/dL (ref 30.0–36.0)
MCV: 79.5 fL — ABNORMAL LOW (ref 80.0–100.0)
Platelets: 187 K/uL (ref 150–400)
RBC: 3.46 MIL/uL — ABNORMAL LOW (ref 3.87–5.11)
RDW: 17.1 % — ABNORMAL HIGH (ref 11.5–15.5)
WBC: 6.4 K/uL (ref 4.0–10.5)
nRBC: 0 % (ref 0.0–0.2)

## 2024-01-11 LAB — HEMOGLOBIN A1C
Hgb A1c MFr Bld: 6.3 % — ABNORMAL HIGH (ref 4.8–5.6)
Mean Plasma Glucose: 134.11 mg/dL

## 2024-01-11 MED ORDER — ACETAMINOPHEN 500 MG PO TABS
1000.0000 mg | ORAL_TABLET | Freq: Four times a day (QID) | ORAL | Status: DC
  Administered 2024-01-11 – 2024-01-16 (×16): 1000 mg via ORAL
  Filled 2024-01-11 (×21): qty 2

## 2024-01-11 NOTE — Discharge Instructions (Addendum)
 Dear Glenford Lesches,   Thank you for letting us  participate in your care! You were admitted for knee pain after a fall. We recommend making an appointment with your orthopedist to follow up when you leave.    POST-HOSPITAL & CARE INSTRUCTIONS We recommend following up with your PCP within 1 week from being discharged from the hospital. Please let PCP/Specialists know of any changes in medications that were made which you will be able to see in the medications section of this packet. We recommend following up with your orthopedist within 1 week from being discharged from the hospital.  DOCTOR'S APPOINTMENTS & FOLLOW UP Future Appointments  Date Time Provider Department Center  01/18/2024 12:50 PM [facility]     Thank you for choosing [facility]! Take care and be well!  Family Medicine Teaching Service Inpatient Team [place]  [facility]  [address] [place], [place] [address] [phone]

## 2024-01-11 NOTE — Plan of Care (Signed)

## 2024-01-11 NOTE — Plan of Care (Signed)
 Went to update the patient at bedside with Dr. Howell.  We provided education on the knee immobilizer, told patient to wear it when she is in pain, or sleeping at night, but to remove the knee immobilizer for PT and OT sessions.  Patient voiced understanding.  We also discussed SNF versus home health pending discharge.  Patient is agreeable to SNF.  She would prefer home health, however, her daughter would not be able to take care of her during the day, and would require more frequent visits then home health provides.  We discussed with the patient that since she is awaiting SNF placement, we will go ahead and order right knee MRI.  Patient has no other concerns at this time.

## 2024-01-11 NOTE — Assessment & Plan Note (Signed)
 Chronic combined systolic and diastolic CHF: continue home furosemide  10mg  daily, home carvedilol  6.25mg  BID, home Jardiance  10mg  daily, and Entresto  24-26mg  BID HLD: continue home rosuvastatin  20mg 

## 2024-01-11 NOTE — Evaluation (Signed)
 Physical Therapy Evaluation Patient Details Name: Brittany Greene MRN: [id number] DOB: 10-06-1940 Today's Date: 01/11/2024  History of Present Illness  Pt is 83 yo female who presents on 01/10/24 after a ground level fall getting out of bed, secondary to R knee giving out. Pt with R hip and knee pain. R knee effusion, no fracture. 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  Clinical Impression  Pt admitted with above diagnosis. Pt from home with daughter who is only present at night due to working. Pt is independent without AD at baseline and is active and driving. Pt reports no issues with R knee prior to this episode of giving way. Pt tolerating only 5-25 deg of R knee ROM currently and was instructed to remove KI in bed and work on ROM. Pt unable to come to EOB or stand independently and needed mod A for both. Pt currently unsafe to be home alone and will not be able to get to a bathroom or prepare food for herself. Patient will benefit from continued inpatient follow up therapy, <3 hours/day. If this is not possible, rec HHPT and a HHaide to assist with daily activities.  Pt currently with functional limitations due to the deficits listed below (see PT Problem List). Pt will benefit from acute skilled PT to increase their independence and safety with mobility to allow discharge.           If plan is discharge home, recommend the following: A lot of help with walking and/or transfers;A little help with bathing/dressing/bathroom;Assistance with cooking/housework;Assist for transportation   Can travel by private vehicle   No    Equipment Recommendations Rolling walker (2 wheels);BSC/3in1  Recommendations for Other Services       Functional Status Assessment Patient has had a recent decline in their functional status and demonstrates the ability to make significant improvements in  function in a reasonable and predictable amount of time.     Precautions / Restrictions Precautions Precautions: Fall;Knee Precaution Booklet Issued: No Recall of Precautions/Restrictions: Impaired (repeated commands for hand placement during transfers) Precaution/Restrictions Comments: reviewed use of KI for mobility but the need to come out of it intermittently for knee ROM Required Braces or Orthoses: Knee Immobilizer - Right Knee Immobilizer - Right: Other (comment) (not specified in orders) Restrictions Weight Bearing Restrictions Per Provider Order: No      Mobility  Bed Mobility Overal bed mobility: Needs Assistance Bed Mobility: Supine to Sit     Supine to sit: Mod assist     General bed mobility comments: pt unable to move RLE independently. Given gait belt to use as leg lift which helped her slide RLE but still needed mod A to come to sitting EOB.    Transfers Overall transfer level: Needs assistance Equipment used: Rolling walker (2 wheels) Transfers: Sit to/from Stand, Bed to chair/wheelchair/BSC Sit to Stand: Mod assist, Min assist, +2 physical assistance   Step pivot transfers: Min assist, +2 physical assistance, +2 safety/equipment       General transfer comment: pt needed mod A +2 for power up from low surface and min A +2 from elevated bed. Pt having difficulty controlling stand to sit with R KI. vc's for hand placement with RW    Ambulation/Gait Ambulation/Gait assistance: Min assist Gait Distance (Feet): 15 Feet Assistive device: Rolling walker (2 wheels) Gait Pattern/deviations: Step-to pattern, Decreased stance time - right, Decreased weight  shift to right Gait velocity: decreased Gait velocity interpretation: <1.31 ft/sec, indicative of household ambulator   General Gait Details: pt unable to tolerate more than TDWB RLE. Fatigues quicky through UE's with increased pressure with stepping. Needed 2 rest breaks for 15' ambulation.  Stairs             Wheelchair Mobility     Tilt Bed    Modified Rankin (Stroke Patients Only)       Balance Overall balance assessment: Needs assistance, History of Falls Sitting-balance support: Feet supported, Single extremity supported Sitting balance-Leahy Scale: Fair     Standing balance support: Bilateral upper extremity supported, During functional activity, Reliant on assistive device for balance Standing balance-Leahy Scale: Poor Standing balance comment: heavy reliance on RW                             Pertinent Vitals/Pain Pain Assessment Pain Assessment: Faces Faces Pain Scale: Hurts even more Pain Location: R knee with mobility; 0 at rest Pain Descriptors / Indicators: Discomfort, Grimacing, Guarding, Sore Pain Intervention(s): Limited activity within patient's tolerance, Monitored during session    Home Living Family/patient expects to be discharged to:: Private residence Living Arrangements: Children Available Help at Discharge: Family;Available PRN/intermittently Type of Home: Other(Comment) (town home) Home Access: Level entry       Home Layout: Two level;Able to live on main level with bedroom/bathroom Home Equipment: Cane - single point;Rollator (4 wheels);Shower seat - built in;Grab bars - tub/shower;Hand held shower head Additional Comments: pt lives with daughter, [name], who works, so only has assist at night    Prior Function Prior Level of Function : Independent/Modified Independent;Driving             Mobility Comments: independent, no AD. Drives ADLs Comments: independent     Extremity/Trunk Assessment   Upper Extremity Assessment Upper Extremity Assessment: Defer to OT evaluation    Lower Extremity Assessment Lower Extremity Assessment: RLE deficits/detail RLE Deficits / Details: hip flex 2-/5, knee ext 1/5, ankle WFL. Pt has pain with all mvmt of RLE. Tolerating only 5-25 deg of knee ROM and very hesitant to do anything  without the KI since it was given to her without specific instructions. Tenderness to palpation of medial and lateral joint lines as well as inferior pole of patella. RLE: Unable to fully assess due to pain RLE Sensation: WNL RLE Coordination: decreased gross motor    Cervical / Trunk Assessment Cervical / Trunk Assessment: Normal  Communication   Communication Communication: No apparent difficulties    Cognition Arousal: Alert Behavior During Therapy: WFL for tasks assessed/performed   PT - Cognitive impairments: Memory                       PT - Cognition Comments: mild STM deficits Following commands: Intact       Cueing Cueing Techniques: Verbal cues     General Comments General comments (skin integrity, edema, etc.): pt has had KI on since donned. Educated pt on removal of KI when not ambulating and working on ROM to avoid stiffness. Educated pt on use of ice every few hours. Daughter present. Pt and daughter have questions about seeing ortho and having MRI    Exercises General Exercises - Lower Extremity Ankle Circles/Pumps: AROM, Both, 10 reps, Supine Quad Sets: AROM, Right, 5 reps, Seated   Assessment/Plan    PT Assessment Patient needs continued PT services  PT  Problem List Decreased strength;Decreased range of motion;Decreased activity tolerance;Decreased balance;Decreased mobility;Decreased knowledge of use of DME;Decreased safety awareness;Decreased knowledge of precautions;Pain       PT Treatment Interventions DME instruction;Gait training;Functional mobility training;Therapeutic activities;Balance training;Therapeutic exercise;Neuromuscular re-education;Patient/family education    PT Goals (Current goals can be found in the Care Plan section)  Acute Rehab PT Goals Patient Stated Goal: figure out what is wrong with knee, what made it buckle and why is still hurts so badly PT Goal Formulation: With patient/family Time For Goal Achievement:  01/25/24 Potential to Achieve Goals: Good    Frequency Min 2X/week     Co-evaluation PT/OT/SLP Co-Evaluation/Treatment: Yes Reason for Co-Treatment: To address functional/ADL transfers;For patient/therapist safety PT goals addressed during session: Mobility/safety with mobility;Balance;Proper use of DME;Strengthening/ROM         AM-PAC PT 6 Clicks Mobility  Outcome Measure Help needed turning from your back to your side while in a flat bed without using bedrails?: A Lot Help needed moving from lying on your back to sitting on the side of a flat bed without using bedrails?: A Lot Help needed moving to and from a bed to a chair (including a wheelchair)?: A Lot Help needed standing up from a chair using your arms (e.g., wheelchair or bedside chair)?: A Lot Help needed to walk in hospital room?: Total Help needed climbing 3-5 steps with a railing? : Total 6 Click Score: 10    End of Session Equipment Utilized During Treatment: Gait belt;Right knee immobilizer Activity Tolerance: Patient limited by pain Patient left: in chair;with call bell/phone within reach;with family/visitor present Nurse Communication: Mobility status PT Visit Diagnosis: Pain;Difficulty in walking, not elsewhere classified (R26.2) Pain - Right/Left: Right Pain - part of body: Knee    Time: 1224-1315 PT Time Calculation (min) (ACUTE ONLY): 51 min   Charges:   PT Evaluation $PT Eval Moderate Complexity: 1 Mod PT Treatments $Gait Training: 8-22 mins PT General Charges $$ ACUTE PT VISIT: 1 Visit         Richerd Lipoma, PT  Acute Rehab Services Secure chat preferred Office [phone]   Richerd CROME Jazzalynn Rhudy 01/11/2024, 2:52 PM

## 2024-01-11 NOTE — Assessment & Plan Note (Addendum)
 Stable - workup outpt. Consider anemia workup if pt staying longer than anticipated Hgb 8.6, baseline 9-10 - am CBC - consider repeat iron panel outpatient

## 2024-01-11 NOTE — Progress Notes (Signed)
     Daily Progress Note Intern Pager: [phone]  Patient name: Brittany Greene Medical record number: [id number] Date of birth: Jun 24, 1940 Age: 83 y.o. Gender: female  Primary Care Provider: Cristopher Suzen HERO, NP Consultants: none Code Status: FULL  Pt Overview and Major Events to Date:  8/17: admitted  Medical Decision Making: Brittany Greene is a 83 y.o. female presenting with R knee pain and effusion s/p mechanical fall. Admitted for pain control. This is likely msk strain from fall combined with OA exacerbation. Assessment & Plan Knee pain Secondary to mechanical fall from level of bed onto knees. No head injury or LOC.  - Admit to MedSurg under Dr. Madelon  - Pain management with 1000mg  Tylenol  q6h PRN for mild pain; 2.5mg  oxycodone  q4h PRN for moderate pain - Regular diet  - f/u PT/OT evaluation - Encourage mobility - Knee stability brace and ace wrap for comfort  Anemia Stable - workup outpt. Consider anemia workup if pt staying longer than anticipated Hgb 8.6, baseline 9-10 - am CBC - consider repeat iron panel outpatient Chronic health problem Chronic combined systolic and diastolic CHF: continue home furosemide  10mg  daily, home carvedilol  6.25mg  BID, home Jardiance  10mg  daily, and Entresto  24-26mg  BID HLD: continue home rosuvastatin  20mg    FEN/GI: regular PPx: lovenox  Dispo:Pending PT recommendations  today.   Subjective:  Patient has no complaints today. She says he knee pain is much better and has full ROM of her RLE.  Objective: Temp:  [97.9 F (36.6 C)-98.7 F (37.1 C)] 98.5 F (36.9 C) (08/18 0405) Pulse Rate:  [62-87] 71 (08/18 0405) Resp:  [14-27] 16 (08/18 0405) BP: (104-158)/(59-98) 106/59 (08/18 0405) SpO2:  [96 %-100 %] 96 % (08/18 0405) Weight:  [135 lb (61.2 kg)] 135 lb (61.2 kg) (08/17 1034) Physical Exam: General: resting in bed, NAD Cardiovascular: RRR, no m/r/g Respiratory: CTAB Extremities: NTTP, full ROM bilat LE  Laboratory: Most  recent CBC Lab Results  Component Value Date   WBC 6.4 01/11/2024   HGB 8.6 (L) 01/11/2024   HCT 27.5 (L) 01/11/2024   MCV 79.5 (L) 01/11/2024   PLT 187 01/11/2024   Most recent BMP    Latest Ref Rng & Units 01/10/2024   11:13 AM  BMP  Glucose 70 - 99 mg/dL 873   BUN 8 - 23 mg/dL 24   Creatinine 9.55 - 1.00 mg/dL 8.47   Sodium 864 - 854 mmol/L 138   Potassium 3.5 - 5.1 mmol/L 3.6   Chloride 98 - 111 mmol/L 108   CO2 22 - 32 mmol/L 21   Calcium  8.9 - 10.3 mg/dL 8.3    Brittany Nakai, DO 01/11/2024, 6:58 AM  PGY-1, [facility] FPTS Intern pager: [phone], text pages welcome Secure chat group [facility] Teaching Service

## 2024-01-11 NOTE — Assessment & Plan Note (Addendum)
 Secondary to mechanical fall from level of bed onto knees. No head injury or LOC.  - Admit to MedSurg under Dr. Rumball  - Pain management with 1000mg  Tylenol  q6h PRN for mild pain; 2.5mg  oxycodone  q4h PRN for moderate pain - Regular diet  - f/u PT/OT evaluation - Encourage mobility - Knee stability brace and ace wrap for comfort

## 2024-01-11 NOTE — Care Management Obs Status (Signed)
 MEDICARE OBSERVATION STATUS NOTIFICATION   Patient Details  Name: Brittany Greene MRN: [id number] Date of Birth: 1940-07-21   Medicare Observation Status Notification Given:  Yes    Jon Cruel 01/11/2024, 12:14 PM

## 2024-01-11 NOTE — Evaluation (Signed)
 Occupational Therapy Evaluation Patient Details Name: Brittany Greene MRN: [id number] DOB: [birth year] Today's Date: 01/11/2024   History of Present Illness   Pt is 83 yo female who presents on 01/10/24 after a ground level fall getting out of bed, secondary to R knee giving out. Pt with R hip and knee pain. R knee effusion, no fracture. 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     Clinical Impressions PTA, pt lived with daughter and was independent in ADL and IADL. Upon eval, pt needing up to mod A for bed mobility, min A +2 for transfers with cues for safety and hand placement, mod A for BADL. Pt noted with poor tolerance of weightbeairng through RLE and with KI on but has had no education for whether can be removed. Encouraged to remove for knee flexion/extension to tolerance with OT/PT present this session. Pt daughter works during the day and pt not yet at a functional level to dc home alone during the day, thus, recommending inpatient rehab <3 hours at this time.      If plan is discharge home, recommend the following:   A little help with walking and/or transfers;A lot of help with bathing/dressing/bathroom;Assistance with cooking/housework;Help with stairs or ramp for entrance;Assist for transportation     Functional Status Assessment   Patient has had a recent decline in their functional status and demonstrates the ability to make significant improvements in function in a reasonable and predictable amount of time.     Equipment Recommendations   BSC/3in1;Other (comment) (RW)     Recommendations for Other Services         Precautions/Restrictions   Precautions Precautions: Fall;Knee Precaution Booklet Issued: No Recall of Precautions/Restrictions: Impaired (repeated commands for hand placement during transfers) Precaution/Restrictions Comments: reviewed use  of KI for mobility but the need to come out of it intermittently for knee ROM Required Braces or Orthoses: Knee Immobilizer - Right Knee Immobilizer - Right: Other (comment) (not specified in orders) Restrictions Weight Bearing Restrictions Per Provider Order: No     Mobility Bed Mobility Overal bed mobility: Needs Assistance Bed Mobility: Supine to Sit     Supine to sit: Mod assist     General bed mobility comments: pt unable to move RLE independently. Given gait belt to use as leg lift which helped her slide RLE but still needed mod A to come to sitting EOB.    Transfers Overall transfer level: Needs assistance Equipment used: Rolling walker (2 wheels) Transfers: Sit to/from Stand, Bed to chair/wheelchair/BSC Sit to Stand: Mod assist, Min assist, +2 physical assistance     Step pivot transfers: Min assist, +2 physical assistance, +2 safety/equipment     General transfer comment: pt needed mod A +2 for power up from low surface and min A +2 from elevated bed. Pt having difficulty controlling stand to sit with R KI. vc's for hand placement with RW      Balance Overall balance assessment: Needs assistance, History of Falls Sitting-balance support: Feet supported, Single extremity supported Sitting balance-Leahy Scale: Fair     Standing balance support: Bilateral upper extremity supported, During functional activity, Reliant on assistive device for balance Standing balance-Leahy Scale: Poor Standing balance comment: heavy reliance on RW                           ADL either performed  or assessed with clinical judgement   ADL Overall ADL's : Needs assistance/impaired Eating/Feeding: Independent;Sitting   Grooming: Set up;Sitting   Upper Body Bathing: Set up;Sitting   Lower Body Bathing: Moderate assistance;Sit to/from stand   Upper Body Dressing : Set up;Sitting   Lower Body Dressing: Moderate assistance;Sit to/from stand   Toilet Transfer: Contact  guard assist;Minimal assistance;Ambulation;Rolling walker (2 wheels)           Functional mobility during ADLs: Contact guard assist;Minimal assistance;Rolling walker (2 wheels) General ADL Comments: poor tolerance of any weightbearing on RLE     Vision Ability to See in Adequate Light: 0 Adequate Patient Visual Report: No change from baseline Vision Assessment?: No apparent visual deficits     Perception Perception: Not tested       Praxis Praxis: Not tested       Pertinent Vitals/Pain Pain Assessment Pain Assessment: Faces Faces Pain Scale: Hurts even more Pain Location: R knee with mobility; 0 at rest Pain Descriptors / Indicators: Discomfort, Grimacing, Guarding, Sore Pain Intervention(s): Limited activity within patient's tolerance, Monitored during session, Ice applied     Extremity/Trunk Assessment Upper Extremity Assessment Upper Extremity Assessment: Overall WFL for tasks assessed   Lower Extremity Assessment Lower Extremity Assessment: Defer to PT evaluation RLE Deficits / Details: hip flex 2-/5, knee ext 1/5, ankle WFL. Pt has pain with all mvmt of RLE. Tolerating only 5-25 deg of knee ROM and very hesitant to do anything without the KI since it was given to her without specific instructions. Tenderness to palpation of medial and lateral joint lines as well as inferior pole of patella. RLE: Unable to fully assess due to pain RLE Sensation: WNL RLE Coordination: decreased gross motor   Cervical / Trunk Assessment Cervical / Trunk Assessment: Normal   Communication Communication Communication: No apparent difficulties   Cognition Arousal: Alert Behavior During Therapy: WFL for tasks assessed/performed Cognition: No apparent impairments             OT - Cognition Comments: decresaed carry over for safety during new education in light of mobility with pain                 Following commands: Intact       Cueing  General Comments   Cueing  Techniques: Verbal cues  pt has had KI on since donned. Educated pt on removal of KI when not ambulating and working on ROM to avoid stiffness. Educated pt on use of ice every few hours. Daughter present. Pt and daughter have questions about seeing ortho and having MRI   Exercises Exercises: General Lower Extremity General Exercises - Lower Extremity Ankle Circles/Pumps: AROM, Both, 10 reps, Supine Quad Sets: AROM, Right, 5 reps, Seated   Shoulder Instructions      Home Living Family/patient expects to be discharged to:: Private residence Living Arrangements: Children Available Help at Discharge: Family;Available PRN/intermittently Type of Home: Other(Comment) (town home) Home Access: Level entry     Home Layout: Two level;Able to live on main level with bedroom/bathroom     Bathroom Shower/Tub: Producer, television/film/video: Standard Bathroom Accessibility: Yes How Accessible: Accessible via walker Home Equipment: Cane - single point;Rollator (4 wheels);Shower seat - built in;Grab bars - tub/shower;Hand held shower head   Additional Comments: pt lives with daughter, [name], who works, so only has assist at night      Prior Functioning/Environment Prior Level of Function : Independent/Modified Independent;Driving  Mobility Comments: independent, no AD. Drives ADLs Comments: independent    OT Problem List: Decreased strength;Decreased range of motion;Impaired balance (sitting and/or standing);Decreased activity tolerance;Decreased safety awareness;Decreased knowledge of use of DME or AE;Decreased knowledge of precautions;Pain   OT Treatment/Interventions: Self-care/ADL training;Therapeutic exercise;DME and/or AE instruction;Therapeutic activities;Patient/family education;Balance training      OT Goals(Current goals can be found in the care plan section)   Acute Rehab OT Goals Patient Stated Goal: get better OT Goal Formulation: With patient Time For  Goal Achievement: 01/25/24 Potential to Achieve Goals: Good   OT Frequency:  Min 2X/week    Co-evaluation   Reason for Co-Treatment: To address functional/ADL transfers;For patient/therapist safety PT goals addressed during session: Mobility/safety with mobility;Balance;Proper use of DME;Strengthening/ROM        AM-PAC OT 6 Clicks Daily Activity     Outcome Measure Help from another person eating meals?: None Help from another person taking care of personal grooming?: A Little Help from another person toileting, which includes using toliet, bedpan, or urinal?: A Little Help from another person bathing (including washing, rinsing, drying)?: A Lot Help from another person to put on and taking off regular upper body clothing?: A Little Help from another person to put on and taking off regular lower body clothing?: A Lot 6 Click Score: 17   End of Session Equipment Utilized During Treatment: Gait belt;Rolling walker (2 wheels);Right knee immobilizer Nurse Communication: Mobility status  Activity Tolerance: Patient tolerated treatment well Patient left: in chair;with call bell/phone within reach;with chair alarm set;with family/visitor present  OT Visit Diagnosis: Unsteadiness on feet (R26.81);Muscle weakness (generalized) (M62.81);History of falling (Z91.81);Pain Pain - Right/Left: Right Pain - part of body: Knee                Time: 1200-1309 OT Time Calculation (min): 69 min Charges:  OT General Charges $OT Visit: 1 Visit OT Evaluation $OT Eval Low Complexity: 1 Low OT Treatments $Self Care/Home Management : 23-37 mins  Elma JONETTA Lebron FREDERICK, OTR/L [facility] Acute Rehabilitation Office: [phone]   Elma JONETTA Lebron 01/11/2024, 2:57 PM

## 2024-01-12 DIAGNOSIS — D649 Anemia, unspecified: Secondary | ICD-10-CM | POA: Diagnosis not present

## 2024-01-12 DIAGNOSIS — W19XXXA Unspecified fall, initial encounter: Secondary | ICD-10-CM | POA: Diagnosis not present

## 2024-01-12 DIAGNOSIS — S8991XA Unspecified injury of right lower leg, initial encounter: Secondary | ICD-10-CM | POA: Diagnosis not present

## 2024-01-12 DIAGNOSIS — M25561 Pain in right knee: Secondary | ICD-10-CM | POA: Diagnosis not present

## 2024-01-12 LAB — GLUCOSE, CAPILLARY
Glucose-Capillary: 91 mg/dL (ref 70–99)
Glucose-Capillary: 142 mg/dL — ABNORMAL HIGH (ref 70–99)
Glucose-Capillary: 63 mg/dL — ABNORMAL LOW (ref 70–99)
Glucose-Capillary: 77 mg/dL (ref 70–99)
Glucose-Capillary: 125 mg/dL — ABNORMAL HIGH (ref 70–99)
Glucose-Capillary: 138 mg/dL — ABNORMAL HIGH (ref 70–99)

## 2024-01-12 NOTE — Progress Notes (Signed)
 Mobility Specialist Progress Note:    01/12/24 1152  Mobility  Activity Pivoted/transferred from bed to chair  Level of Assistance Moderate assist, patient does 50-74% (+2)  Assistive Device Front wheel walker  Distance Ambulated (ft) 5 ft  Activity Response Tolerated well  Mobility Referral Yes  Mobility visit 1 Mobility  Mobility Specialist Start Time (ACUTE ONLY) 1050  Mobility Specialist Stop Time (ACUTE ONLY) 1105  Mobility Specialist Time Calculation (min) (ACUTE ONLY) 15 min   Pt received in bed agreeable to mobility. No physical assistance needed for bed mobility. ModA+2 for STS. Attempted to ambulate w/ HHA but unable to. Switched to RW pt was able to take a few steps towards the chair w/o fault. Pt requested to use bed pan in chair. MS offered BSC pt refused. Pt unable to void. Stood w/ MinA +2 from the chair. Left in chair w/ call bell and personal belongings in reach. All needs met.   Thersia Minder Mobility Specialist  Please contact vis Secure Chat or  Rehab Office [phone]

## 2024-01-12 NOTE — Assessment & Plan Note (Addendum)
 Stable. Secondary to mechanical fall from level of bed onto knees. No head injury or LOC.  - Admit to MedSurg under Dr. Rumball  - Pain management with 1000mg  Tylenol  q6h; 2.5mg  oxycodone  q4h PRN for moderate pain - Regular diet  - PT/OT evaluation - recommending SNF - Encourage mobility - Knee stability brace and ace wrap for comfort

## 2024-01-12 NOTE — Plan of Care (Signed)
  Problem: Health Behavior/Discharge Planning: Goal: Ability to manage health-related needs will improve Outcome: Progressing   Problem: Clinical Measurements: Goal: Ability to maintain clinical measurements within normal limits will improve Outcome: Progressing Goal: Will remain free from infection Outcome: Completed/Met Goal: Diagnostic test results will improve Outcome: Progressing Goal: Respiratory complications will improve Outcome: Progressing Goal: Cardiovascular complication will be avoided Outcome: Progressing   Problem: Activity: Goal: Risk for activity intolerance will decrease Outcome: Progressing   Problem: Nutrition: Goal: Adequate nutrition will be maintained Outcome: Progressing   Problem: Coping: Goal: Level of anxiety will decrease Outcome: Progressing   Problem: Elimination: Goal: Will not experience complications related to bowel motility Outcome: Progressing Goal: Will not experience complications related to urinary retention Outcome: Progressing

## 2024-01-12 NOTE — Progress Notes (Signed)
     Daily Progress Note Intern Pager: [phone]  Patient name: Brittany Greene Medical record number: [id number] Date of birth: June 27, 1940 Age: 83 y.o. Gender: female  Primary Care Provider: Cristopher Suzen HERO, NP Consultants: none Code Status: FULL  Pt Overview and Major Events to Date:  8/17: admitted  Medical Decision Making: Brittany Greene is a 83 y.o. female presenting with R knee pain and effusion s/p mechanical fall. Admitted for pain control. This is likely msk strain from fall combined with OA exacerbation.   Patient is medically stable for discharge - she is currently awaiting SNF placement  Assessment & Plan Knee pain Injury of right knee Fall Stable. Secondary to mechanical fall from level of bed onto knees. No head injury or LOC.  - Admit to MedSurg under Dr. Madelon  - Pain management with 1000mg  Tylenol  q6h; 2.5mg  oxycodone  q4h PRN for moderate pain - Regular diet  - PT/OT evaluation - recommending SNF - Encourage mobility - Knee stability brace and ace wrap for comfort  Chronic health problem Chronic combined systolic and diastolic CHF: continue home furosemide  10mg  daily, home carvedilol  6.25mg  BID, home Jardiance  10mg  daily, and Entresto  24-26mg  BID HLD: continue home rosuvastatin  20mg   Anemia: stable, Hgb 8.6   FEN/GI: Regular PPx: Lovenox  Dispo:SNF pending bed placement  Subjective:  Patient is resting in bed comfortably today.  She says her knee feels much better and shows me how well she is able to move her leg around.  She endorses still having difficulty with weightbearing and ambulation around the room. She has no other concerns at this time.  Objective: Temp:  [98.4 F (36.9 C)-99.2 F (37.3 C)] 98.5 F (36.9 C) (08/19 0419) Pulse Rate:  [63-73] 63 (08/19 0419) Resp:  [16-18] 16 (08/19 0419) BP: (103-119)/(58-61) 103/58 (08/19 0419) SpO2:  [96 %-100 %] 100 % (08/19 0419) Physical Exam: General: Laying in bed, NAD Cardiovascular: RRR, no  m/r/g Respiratory: CTAB Abdomen: Soft, nontender, nondistended Extremities: Right knee in knee immobilizer, both U&L extremities symmetric and well-perfused  Laboratory: Most recent CBC Lab Results  Component Value Date   WBC 6.4 01/11/2024   HGB 8.6 (L) 01/11/2024   HCT 27.5 (L) 01/11/2024   MCV 79.5 (L) 01/11/2024   PLT 187 01/11/2024   Most recent BMP    Latest Ref Rng & Units 01/10/2024   11:13 AM  BMP  Glucose 70 - 99 mg/dL 873   BUN 8 - 23 mg/dL 24   Creatinine 9.55 - 1.00 mg/dL 8.47   Sodium 864 - 854 mmol/L 138   Potassium 3.5 - 5.1 mmol/L 3.6   Chloride 98 - 111 mmol/L 108   CO2 22 - 32 mmol/L 21   Calcium  8.9 - 10.3 mg/dL 8.3    MRI Knee Radiologist impression:  1. Macerated and diminutive appearance of the medial meniscus with complex tear of the remnant body extending to the anterior and posterior horns. 2. Degeneration the lateral meniscus. 3. Tricompartmental osteoarthritis, as above, with moderate to advanced cartilage loss of the medial femorotibial compartment. 4. Large joint effusion with synovitis and trace layering intermediate signal intensity fluid in the suprapatellar recess, which could represent a small volume of intra-articular blood products secondary to trauma. 5. Disruption of the medial patellar retinaculum.  Brittany Nakai, DO 01/12/2024, 7:16 AM  PGY-1, [facility] FPTS Intern pager: [phone], text pages welcome Secure chat group [facility] Teaching Service

## 2024-01-12 NOTE — Plan of Care (Signed)
 Informed consent for steroid knee injection obtained Discussed risks including infection, bleeding, joint injury, nerve injury, pain, swelling Pt would like to proceed, will plan to do this 8/20 Signed consent in pt's physical chart

## 2024-01-12 NOTE — Assessment & Plan Note (Addendum)
 Chronic combined systolic and diastolic CHF: continue home furosemide  10mg  daily, home carvedilol  6.25mg  BID, home Jardiance  10mg  daily, and Entresto  24-26mg  BID HLD: continue home rosuvastatin  20mg   Anemia: stable, Hgb 8.6

## 2024-01-12 NOTE — Assessment & Plan Note (Deleted)
 Stable - workup outpt. Consider anemia workup if pt staying longer than anticipated Hgb 8.6, baseline 9-10 - consider repeat iron panel outpatient

## 2024-01-12 NOTE — Progress Notes (Signed)
 Physical Therapy Treatment Patient Details Name: Brittany Greene MRN: [id number] DOB: April 14, 1941 Today's Date: 01/12/2024   History of Present Illness Pt is 83 yo female who presents on 01/10/24 after a ground level fall getting out of bed, secondary to R knee giving out. Pt with R hip and knee pain. R knee effusion, no fracture. 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    PT Comments  Pt up in chair on arrival and agreeable to session with steady progress towards acute goals. Pt continues to be limited in safe mobility by pain, decreased activity tolerance and impaired balance/postural reactions. Pt requiring mod A to come to stand from recliner chair and elevated BSC and min A to maintain balance to transfer chair>BSC>EOB. Pt unable to accept weight onto RLE in standing this session with pt shifting on L foot heel/toe to complete transfer. Pt unable to progress gait due to pain, limiting standing and weightbearing tolerance. Patient will benefit from continued inpatient follow up therapy, <3 hours/day, will continue to follow acutely.    If plan is discharge home, recommend the following: A lot of help with walking and/or transfers;A little help with bathing/dressing/bathroom;Assistance with cooking/housework;Assist for transportation   Can travel by private vehicle     No  Equipment Recommendations  Rolling walker (2 wheels);BSC/3in1    Recommendations for Other Services       Precautions / Restrictions Precautions Precautions: Fall;Knee Precaution Booklet Issued: No Recall of Precautions/Restrictions: Impaired (repeated commands for hand placement during transfers) Precaution/Restrictions Comments: reviewed use of KI for mobility but the need to come out of it intermittently for knee ROM Required Braces or Orthoses: Knee Immobilizer - Right Knee Immobilizer - Right: Other  (comment) (not specified in orders) Restrictions Weight Bearing Restrictions Per Provider Order: No     Mobility  Bed Mobility Overal bed mobility: Needs Assistance Bed Mobility: Sit to Supine       Sit to supine: Min assist   General bed mobility comments: min A to manage RLE into bed    Transfers Overall transfer level: Needs assistance Equipment used: Rolling walker (2 wheels) Transfers: Sit to/from Stand, Bed to chair/wheelchair/BSC Sit to Stand: Mod assist, From elevated surface Stand pivot transfers: Min assist         General transfer comment: pt needing mod A to power up from chair and elevated BSC, pt unable to take steps from chair>BSC>EOB and pivoting heel/toe on LLE to complete trasnfer    Ambulation/Gait               General Gait Details: pt unable to tolerate weightbearing through RLE this session to progress gait   Stairs             Wheelchair Mobility     Tilt Bed    Modified Rankin (Stroke Patients Only)       Balance Overall balance assessment: Needs assistance, History of Falls Sitting-balance support: Feet supported, Single extremity supported Sitting balance-Leahy Scale: Fair     Standing balance support: Bilateral upper extremity supported, During functional activity, Reliant on assistive device for balance Standing balance-Leahy Scale: Poor Standing balance comment: heavy reliance on RW and external support                            Communication Communication Communication: No apparent difficulties  Cognition Arousal: Alert Behavior  During Therapy: WFL for tasks assessed/performed                             Following commands: Intact      Cueing Cueing Techniques: Verbal cues  Exercises      General Comments        Pertinent Vitals/Pain Pain Assessment Pain Assessment: Faces Faces Pain Scale: Hurts little more Pain Location: R knee with mobility; 0 at rest Pain Descriptors /  Indicators: Discomfort, Grimacing, Guarding, Sore Pain Intervention(s): Monitored during session, Limited activity within patient's tolerance, Repositioned    Home Living                          Prior Function            PT Goals (current goals can now be found in the care plan section) Acute Rehab PT Goals Patient Stated Goal: figure out what is wrong with knee, what made it buckle and why is still hurts so badly PT Goal Formulation: With patient/family Time For Goal Achievement: 01/25/24 Progress towards PT goals: Progressing toward goals (slowly)    Frequency    Min 2X/week      PT Plan      Co-evaluation              AM-PAC PT 6 Clicks Mobility   Outcome Measure  Help needed turning from your back to your side while in a flat bed without using bedrails?: A Lot Help needed moving from lying on your back to sitting on the side of a flat bed without using bedrails?: A Lot Help needed moving to and from a bed to a chair (including a wheelchair)?: A Lot Help needed standing up from a chair using your arms (e.g., wheelchair or bedside chair)?: A Lot Help needed to walk in hospital room?: Total Help needed climbing 3-5 steps with a railing? : Total 6 Click Score: 10    End of Session Equipment Utilized During Treatment: Gait belt;Right knee immobilizer Activity Tolerance: Patient limited by pain Patient left: with call bell/phone within reach;in bed;with bed alarm set Nurse Communication: Mobility status;Other (comment) (pt requesting updates on MRI results) PT Visit Diagnosis: Pain;Difficulty in walking, not elsewhere classified (R26.2) Pain - Right/Left: Right Pain - part of body: Knee     Time: 8641-8577 PT Time Calculation (min) (ACUTE ONLY): 24 min  Charges:    $Therapeutic Activity: 23-37 mins PT General Charges $$ ACUTE PT VISIT: 1 Visit                     Gizel Riedlinger R. PTA Acute Rehabilitation Services Office: [phone]   Therisa CHRISTELLA Boor 01/12/2024, 2:48 PM

## 2024-01-12 NOTE — Plan of Care (Signed)
 Updated patient at bedside and daughter via phone regarding MRI results. Patient is still unable to bear weight, unable to use the bathroom on her own due to her knee pain.  Any movement of the knee causes pain she says.  She does not feel safe to go home and is scared that if she goes home she will fall because she is herself.   Recommend removing knee immobilizer and encouraging gentle knee flexion exercises Will discuss steroid injection in the knee to help with pain Patient requires continued physical therapy

## 2024-01-12 NOTE — TOC Initial Note (Signed)
 Transition of Care [facility]) - Initial/Assessment Note    Patient Details  Name: Brittany Greene MRN: [id number] Date of Birth: 02/22/1941  [facility] CM/SW Contact:    Bridget Cordella Simmonds, LCSW Phone Number: 01/12/2024, 2:23 PM  Clinical Narrative:     CSW spoke with pt regarding PT recommendations for SNF.  Pt lives with Brittany Greene, permission give to speak with Sonja or other children listed on facesheet.  No current home services.  Discussed lack of 3 midnight inpt stay and the barrier this is to SNF placement.  Pt states she is home alone during the day and does need SNF.  CSW reached out to UR, pt will be reviewed on obs call today.  1300: Message from Cory/UR: pt will remain obs.  MD team updated.    1415: CSW spoke with Brittany Greene, who was upset, also reports pt would be home alone during the day, asking for update on pt MRI, MD team informed.    TOC will continue to follow.               Expected Discharge Plan:  (TBD) Barriers to Discharge: Other (must enter comment), SNF Pending bed offer (will not have required inpt stay for DC to SNF)   Patient Goals and CMS Choice Patient states their goals for this hospitalization and ongoing recovery are:: be like I was          Expected Discharge Plan and Services In-house Referral: Clinical Social Work   Post Acute Care Choice:  (TBD) Living arrangements for the past 2 months: Single Family Home                                      Prior Living Arrangements/Services Living arrangements for the past 2 months: Single Family Home Lives with:: Adult Children (Brittany Greene) Patient language and need for interpreter reviewed:: Yes Do you feel safe going back to the place where you live?: No   pt reports she is home alone during the day while her Brittany works, which is not safe currently  Need for Family Participation in Patient Care: Yes (Comment) Care giver support system in place?: Yes  (comment) Current home services: Other (comment) (none) Criminal Activity/Legal Involvement Pertinent to Current Situation/Hospitalization: No - Comment as needed  Activities of Daily Living      Permission Sought/Granted Permission sought to share information with : Family Supports Permission granted to share information with : Yes, Verbal Permission Granted  Share Information with NAME: Brittany Greene           Emotional Assessment Appearance:: Appears stated age Attitude/Demeanor/Rapport: Engaged Affect (typically observed): Appropriate Orientation: :  (not documented, appears oriented)      Admission diagnosis:  Knee pain [M25.569] Fall, initial encounter [W19.XXXA] Injury of right knee, initial encounter [S89.91XA] Patient Active Problem List   Diagnosis Date Noted   Injury of right knee 01/11/2024   Fall 01/11/2024   Knee pain 01/10/2024   Chronic health problem 01/10/2024   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/25/2022   Chronic combined systolic and diastolic CHF (congestive heart failure) (HCC) 02/26/2022   Hyperlipidemia LDL goal <70    DCM (dilated cardiomyopathy) (HCC)    CHF exacerbation (HCC) 11/10/2021   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 AT [address] OF [facility] [address] [place] [address] Phone: [phone] Fax: [phone]  [facility] [address] [place] [address] Phone: [phone]  Fax: [phone]     Social Drivers of Health (SDOH) Social History: SDOH Screenings   Food Insecurity: No Food Insecurity (01/11/2024)  Housing: Low Risk  (01/11/2024)  Transportation Needs: No Transportation Needs (01/11/2024)  Utilities: Not At Risk (01/11/2024)  Social Connections: Moderately Integrated (01/11/2024)  Tobacco Use: Medium Risk (01/10/2024)   SDOH Interventions:     Readmission Risk Interventions     No data to display

## 2024-01-13 DIAGNOSIS — W19XXXA Unspecified fall, initial encounter: Secondary | ICD-10-CM | POA: Diagnosis not present

## 2024-01-13 DIAGNOSIS — M25561 Pain in right knee: Secondary | ICD-10-CM | POA: Diagnosis not present

## 2024-01-13 DIAGNOSIS — S8991XA Unspecified injury of right lower leg, initial encounter: Secondary | ICD-10-CM | POA: Diagnosis not present

## 2024-01-13 DIAGNOSIS — D649 Anemia, unspecified: Secondary | ICD-10-CM | POA: Diagnosis not present

## 2024-01-13 LAB — GLUCOSE, CAPILLARY
Glucose-Capillary: 94 mg/dL (ref 70–99)
Glucose-Capillary: 91 mg/dL (ref 70–99)
Glucose-Capillary: 124 mg/dL — ABNORMAL HIGH (ref 70–99)
Glucose-Capillary: 217 mg/dL — ABNORMAL HIGH (ref 70–99)

## 2024-01-13 MED ORDER — POLYETHYLENE GLYCOL 3350 17 G PO PACK
17.0000 g | PACK | Freq: Every day | ORAL | Status: DC
  Administered 2024-01-13 – 2024-01-18 (×4): 17 g via ORAL
  Filled 2024-01-13 (×6): qty 1

## 2024-01-13 NOTE — Plan of Care (Addendum)
 Spoke with on-call [facility].  They recommend knee aspiration and lidocaine /steroid injection.  Work with PT/OT, and if patient can ambulate she could follow-up outpatient this week to be evaluated for knee pain/to see if she is a candidate for knee replacement.  If aspiration/injection fails, or patient is not improving they recommend we reach back out.  We greatly appreciate their assistance in this patient's care.

## 2024-01-13 NOTE — Progress Notes (Signed)
 Physical Therapy Treatment Patient Details Name: Brittany Greene MRN: [id number] DOB: 1940/09/11 Today's Date: 01/13/2024   History of Present Illness Pt is 83 yo female who presents on 01/10/24 after a ground level fall getting out of bed, secondary to R knee giving out. Pt with R hip and knee pain. R knee effusion, no fracture. 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    PT Comments  Pt seen for PM session per MD request s/p knee aspiration. Pt up in chair on arrival and agreeable to session with slow but steady progress towards acute goals. Pt continues to be limited in safe mobility by pain and decreased activity tolerance. Pt able to come to stand from chair with min A and pivot to [facility] with max cues for sequencing and min A to maintain balance. Pt progressing gait this session with RW support with pt demonstrating increased weight bearing through RLE with min A needed throughout for balance and RW management, chair follow provided for safety as pt quick to fatigue. Pt performing seated RLE exercises at end of session and verbalizing understanding of completion between therapies. Patient will benefit from continued inpatient follow up therapy, <3 hours/day, will continue to follow acutely.    If plan is discharge home, recommend the following: A lot of help with walking and/or transfers;A little help with bathing/dressing/bathroom;Assistance with cooking/housework;Assist for transportation   Can travel by private vehicle     No  Equipment Recommendations  Rolling walker (2 wheels);BSC/3in1    Recommendations for Other Services       Precautions / Restrictions Precautions Precautions: Fall;Knee Precaution Booklet Issued: No Recall of Precautions/Restrictions: Impaired (repeated commands for hand placement during transfers) Precaution/Restrictions Comments: reviewed use of KI for  mobility but the need to come out of it intermittently for knee ROM Required Braces or Orthoses: Knee Immobilizer - Right Knee Immobilizer - Right: Other (comment) (not specified in orders) Restrictions Weight Bearing Restrictions Per Provider Order: No     Mobility  Bed Mobility Overal bed mobility: Needs Assistance           General bed mobility comments: pt up in chair on arrival and at end of session    Transfers Overall transfer level: Needs assistance Equipment used: Rolling walker (2 wheels) Transfers: Sit to/from Stand, Bed to chair/wheelchair/BSC Sit to Stand: Min assist Stand pivot transfers: Min assist         General transfer comment: min A to boost to stand with cues for hand placement, pt needing step by step cues to step pivot chair >BSC    Ambulation/Gait Ambulation/Gait assistance: Min assist, +2 safety/equipment (chair follow) Gait Distance (Feet): 14 Feet Assistive device: Rolling walker (2 wheels) Gait Pattern/deviations: Step-to pattern, Decreased stance time - right, Decreased weight shift to right Gait velocity: decreased     General Gait Details: max cues for sequencing and UE use on RW, pt able to bear increased weight through RLE this session, needing max cues for sequencing and RW management and min A to maintain balance   Stairs             Wheelchair Mobility     Tilt Bed    Modified Rankin (Stroke Patients Only)       Balance Overall balance assessment: Needs assistance, History of Falls Sitting-balance support: Feet supported, Single extremity supported Sitting balance-Leahy Scale: Fair     Standing balance  support: Bilateral upper extremity supported, During functional activity, Reliant on assistive device for balance Standing balance-Leahy Scale: Poor Standing balance comment: heavy reliance on RW and external support                            Communication Communication Communication: No apparent  difficulties  Cognition Arousal: Alert Behavior During Therapy: WFL for tasks assessed/performed                             Following commands: Intact      Cueing Cueing Techniques: Verbal cues  Exercises General Exercises - Lower Extremity Quad Sets: AROM, Right, 5 reps, Seated Heel Slides: AAROM, Right, 5 reps, Seated    General Comments General comments (skin integrity, edema, etc.): donned KI for mobility only, doffed once returned to chair and ice applied, reviewed removing KI when not completing mobility      Pertinent Vitals/Pain Pain Assessment Pain Assessment: Faces Faces Pain Scale: Hurts little more Pain Location: R knee with mobility; 0 at rest Pain Descriptors / Indicators: Discomfort, Grimacing, Guarding, Sore Pain Intervention(s): Monitored during session, Limited activity within patient's tolerance, Premedicated before session    Home Living                          Prior Function            PT Goals (current goals can now be found in the care plan section) Acute Rehab PT Goals Patient Stated Goal: figure out what is wrong with knee, what made it buckle and why is still hurts so badly PT Goal Formulation: With patient/family Time For Goal Achievement: 01/25/24 Progress towards PT goals: Progressing toward goals    Frequency    Min 2X/week      PT Plan      Co-evaluation              AM-PAC PT 6 Clicks Mobility   Outcome Measure  Help needed turning from your back to your side while in a flat bed without using bedrails?: A Lot Help needed moving from lying on your back to sitting on the side of a flat bed without using bedrails?: A Lot Help needed moving to and from a bed to a chair (including a wheelchair)?: A Little Help needed standing up from a chair using your arms (e.g., wheelchair or bedside chair)?: A Lot Help needed to walk in hospital room?: A Lot (<20') Help needed climbing 3-5 steps with a railing? :  Total 6 Click Score: 12    End of Session Equipment Utilized During Treatment: Gait belt;Right knee immobilizer Activity Tolerance: Patient limited by pain Patient left: with call bell/phone within reach;in chair Nurse Communication: Mobility status PT Visit Diagnosis: Pain;Difficulty in walking, not elsewhere classified (R26.2) Pain - Right/Left: Right Pain - part of body: Knee     Time: 1353-1416 PT Time Calculation (min) (ACUTE ONLY): 23 min  Charges:    $Gait Training: 23-37 mins PT General Charges $$ ACUTE PT VISIT: 1 Visit                     Therisa R. PTA Acute Rehabilitation Services Office: [phone]   Therisa CHRISTELLA Boor 01/13/2024, 4:14 PM

## 2024-01-13 NOTE — Assessment & Plan Note (Signed)
 Chronic combined systolic and diastolic CHF: continue home furosemide  10mg  daily, home carvedilol  6.25mg  BID, home Jardiance  10mg  daily, and Entresto  24-26mg  BID HLD: continue home rosuvastatin  20mg   Anemia: stable, Hgb 8.6

## 2024-01-13 NOTE — Assessment & Plan Note (Addendum)
 Improving. Secondary to mechanical fall from level of bed onto knees. No head injury or LOC.  - Admit to MedSurg under Dr. Rumball  - Pain management with 1000mg  Tylenol  q6h; 2.5mg  oxycodone  q4h PRN for moderate pain - PT/OT -if patient can ambulate she can follow-up outpatient Ortho - Encourage mobility - knee aspiration & lidocaine /steroid injection today

## 2024-01-13 NOTE — Procedures (Addendum)
 PROCEDURE NOTE:   Knee Injection/Aspiration  After informed verbal consent was obtained, patient was seated in bedside chair. Right knee was prepped with alcohol swab. Utilizing superior-lateral approach, patient's left knee was aspirated with 18 gauge needle, removed ~4 ml of sero-sanguinous fluid. Injected intraarticularly with mixture of 1cc of methylprednisolone 40mg /mL and 4cc of 1% lidocaine  without epinephrine. Patient tolerated the procedure well without immediate complications.   Supervising Preceptor: Donald Lai, DO  I saw and examined the patient and have discussed with the resident. I agree with the resident's note. I supervised and was present for the key portions of the procedure.  Donald Lai, DO 01/13/2024 12:48 PM

## 2024-01-13 NOTE — Progress Notes (Signed)
 Physical Therapy Treatment Patient Details Name: Brittany Greene MRN: [id number] DOB: [birth year] Today's Date: 01/13/2024   History of Present Illness Pt is 83 yo female who presents on 01/10/24 after a ground level fall getting out of bed, secondary to R knee giving out. Pt with R hip and knee pain. R knee effusion, no fracture. 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    PT Comments  Pt resting in bed on arrival and agreeable to session. Pt continues to be limited in safe mobility by RLE pain, decreased activity tolerance and impaired balance/postural reactions. Pt requiring min A to complete bed mobility and mod A to boost to stand from EOB at lowest height. Pt able to take a few steps from EOB>chair with min a to maintain balance with pt maintaining mostly TDWB on R due to pain. Pt with x1 LOB needing min A to prevent fall. Pt up in chair at end of session with all needs met. Continued education on only donning KI during mobility and doffing when resting to complete ROM exercises between therapies. Pt continues to benefit from skilled PT services to progress toward functional mobility goals.     If plan is discharge home, recommend the following: A lot of help with walking and/or transfers;A little help with bathing/dressing/bathroom;Assistance with cooking/housework;Assist for transportation   Can travel by private vehicle     No  Equipment Recommendations  Rolling walker (2 wheels);BSC/3in1    Recommendations for Other Services       Precautions / Restrictions Precautions Precautions: Fall;Knee Precaution Booklet Issued: No Recall of Precautions/Restrictions: Impaired (repeated commands for hand placement during transfers) Precaution/Restrictions Comments: reviewed use of KI for mobility but the need to come out of it intermittently for knee ROM Required Braces or Orthoses:  Knee Immobilizer - Right Knee Immobilizer - Right: Other (comment) (not specified in orders) Restrictions Weight Bearing Restrictions Per Provider Order: No     Mobility  Bed Mobility Overal bed mobility: Needs Assistance Bed Mobility: Supine to Sit     Supine to sit: Min assist     General bed mobility comments: min A to manage RLE and to scoot out to EOB    Transfers Overall transfer level: Needs assistance Equipment used: Rolling walker (2 wheels) Transfers: Sit to/from Stand, Bed to chair/wheelchair/BSC Sit to Stand: Mod assist           General transfer comment: mod A to stand from EOB at lowest height    Ambulation/Gait Ambulation/Gait assistance: Min assist Gait Distance (Feet): 5 Feet Assistive device: Rolling walker (2 wheels) Gait Pattern/deviations: Step-to pattern, Decreased stance time - right, Decreased weight shift to right Gait velocity: decreased     General Gait Details: max cues for sequencing and UE use on RW, pt maintaining TDWB throughout and hopping on LLE, x1 LOB needing min A to prevent fall   Stairs             Wheelchair Mobility     Tilt Bed    Modified Rankin (Stroke Patients Only)       Balance Overall balance assessment: Needs assistance, History of Falls Sitting-balance support: Feet supported, Single extremity supported Sitting balance-Leahy Scale: Fair     Standing balance support: Bilateral upper extremity supported, During functional activity, Reliant on assistive device for balance Standing balance-Leahy Scale: Poor Standing balance comment: heavy reliance on RW and external support  Communication Communication Communication: No apparent difficulties  Cognition Arousal: Alert Behavior During Therapy: WFL for tasks assessed/performed                             Following commands: Intact      Cueing Cueing Techniques: Verbal cues  Exercises       General Comments General comments (skin integrity, edema, etc.): donned KI for mobility only, doffec once up in chair and ice applied, reviewed removing KI when not completing mobility      Pertinent Vitals/Pain Pain Assessment Pain Assessment: Faces Faces Pain Scale: Hurts little more Pain Location: R knee with mobility; 0 at rest Pain Descriptors / Indicators: Discomfort, Grimacing, Guarding, Sore Pain Intervention(s): Monitored during session, Limited activity within patient's tolerance    Home Living                          Prior Function            PT Goals (current goals can now be found in the care plan section) Acute Rehab PT Goals Patient Stated Goal: figure out what is wrong with knee, what made it buckle and why is still hurts so badly PT Goal Formulation: With patient/family Time For Goal Achievement: 01/25/24 Progress towards PT goals: Progressing toward goals    Frequency    Min 2X/week      PT Plan      Co-evaluation              AM-PAC PT 6 Clicks Mobility   Outcome Measure  Help needed turning from your back to your side while in a flat bed without using bedrails?: A Lot Help needed moving from lying on your back to sitting on the side of a flat bed without using bedrails?: A Lot Help needed moving to and from a bed to a chair (including a wheelchair)?: A Little Help needed standing up from a chair using your arms (e.g., wheelchair or bedside chair)?: A Lot Help needed to walk in hospital room?: A Lot (<20') Help needed climbing 3-5 steps with a railing? : Total 6 Click Score: 12    End of Session Equipment Utilized During Treatment: Gait belt;Right knee immobilizer Activity Tolerance: Patient limited by pain Patient left: with call bell/phone within reach;in chair Nurse Communication: Mobility status PT Visit Diagnosis: Pain;Difficulty in walking, not elsewhere classified (R26.2) Pain - Right/Left: Right Pain - part of  body: Knee     Time: 8880-8866 PT Time Calculation (min) (ACUTE ONLY): 14 min  Charges:    $Therapeutic Activity: 8-22 mins PT General Charges $$ ACUTE PT VISIT: 1 Visit                     Therisa R. PTA Acute Rehabilitation Services Office: [phone]   Therisa CHRISTELLA Boor 01/13/2024, 12:14 PM

## 2024-01-13 NOTE — Progress Notes (Signed)
     Daily Progress Note Intern Pager: [phone]  Patient name: Brittany Greene Medical record number: [id number] Date of birth: 1940-12-21 Age: 83 y.o. Gender: female  Primary Care Provider: Cristopher Suzen HERO, NP Consultants: none Code Status: FULL  Pt Overview and Major Events to Date:  8/17: admitted  Medical Decision Making: Brittany Greene is a 83 y.o. female presenting with R knee pain and effusion s/p mechanical fall. Admitted for pain control. This is likely msk strain from fall combined with OA exacerbation.  Assessment & Plan Knee pain Injury of right knee Fall Improving. Secondary to mechanical fall from level of bed onto knees. No head injury or LOC.  - Admit to MedSurg under Dr. Madelon  - Pain management with 1000mg  Tylenol  q6h; 2.5mg  oxycodone  q4h PRN for moderate pain - PT/OT -if patient can ambulate she can follow-up outpatient Ortho - Encourage mobility - knee aspiration & lidocaine /steroid injection today Chronic health problem Chronic combined systolic and diastolic CHF: continue home furosemide  10mg  daily, home carvedilol  6.25mg  BID, home Jardiance  10mg  daily, and Entresto  24-26mg  BID HLD: continue home rosuvastatin  20mg   Anemia: stable, Hgb 8.6   FEN/GI: Regular PPx: Lovenox  Dispo:Home with home health  Subjective:  Patient reports her pain is improving.  We had a long discussion regarding her discharge plan.  I said PT/OT is recommending SNF, however she does not have the medical complexity necessary to qualify for SNF.  The next best option would be to send patient home with home health, however, patient requires more assistance with ADLs than home health has capability to provide. It is the patient's understanding that she is hospitalized to receive PT/OT care.  I discussed with patient that her insurance is no longer covering her hospitalization, she will receive PT/OT while she is in the hospital, however this is not the long-term plan.  Also discussed doing  an knee injection pain control which she was agreeable to.  Has had much success with steroid injections for her osteoarthritis in the past.  Objective: Temp:  [98 F (36.7 C)-98.4 F (36.9 C)] 98.1 F (36.7 C) (08/20 0636) Pulse Rate:  [64-71] 71 (08/20 0636) Resp:  [16-18] 16 (08/20 0636) BP: (99-111)/(57-72) 108/72 (08/20 0636) SpO2:  [97 %-100 %] 98 % (08/20 0636) Physical Exam: General: Resting in bed, NAD Respiratory: Normal work of breathing on room air Abdomen: Soft, nontender, nondistended Extremities: Tender to palpation superior and inferior to the patella.  Some tenderness over anterior aspect of patella. Small fluctuant effusion present.  Extremely tender to passive ROM testing, and refused strength testing and active ROM.  Laboratory: Most recent CBC Lab Results  Component Value Date   WBC 6.4 01/11/2024   HGB 8.6 (L) 01/11/2024   HCT 27.5 (L) 01/11/2024   MCV 79.5 (L) 01/11/2024   PLT 187 01/11/2024   Most recent BMP    Latest Ref Rng & Units 01/10/2024   11:13 AM  BMP  Glucose 70 - 99 mg/dL 873   BUN 8 - 23 mg/dL 24   Creatinine 9.55 - 1.00 mg/dL 8.47   Sodium 864 - 854 mmol/L 138   Potassium 3.5 - 5.1 mmol/L 3.6   Chloride 98 - 111 mmol/L 108   CO2 22 - 32 mmol/L 21   Calcium  8.9 - 10.3 mg/dL 8.3    Brittany Nakai, DO 01/13/2024, 7:31 AM  PGY-1, [facility] FPTS Intern pager: [phone], text pages welcome Secure chat group [facility] Teaching Service

## 2024-01-13 NOTE — Consult Note (Signed)
 Chief Complaint: Right knee pain  History:Pt is 83 yo female who presents on 01/10/24 after a ground level fall getting out of bed, secondary to R knee giving out. Pt with R hip and knee pain. R knee effusion, no fracture.  Patient denies any loss of consciousness.  Patient has had intractable pain since her admission and is unable to ambulate.  Patient had a knee aspiration today with injection of steroid and Marcaine but did not feel any improvement.  As a result of the ongoing pain orthopedic consultation was requested.   PMH - cardiomyopathy with reduced EF, HTN, AAA repair and AVR (2016) left carotid-subclavian bypass and TEVAR (2019), DM II, breast cancer, and CKD III.   Past Medical History:  Diagnosis Date   AA (aortic aneurysm) (HCC)    hx of aortic aneurysm repair 02/2015 with redo sternotomy with debranching of innominate and carotid arteries 09/2017, left carotid-subclavian bypass and TEVAR 09/2017   Anemia    Aortic regurgitation    Cancer of left breast (HCC)    Cardiomyopathy (HCC)    Celiac artery stenosis (HCC)    Chronic kidney disease, stage 3a (HCC)    High cholesterol    History of blood transfusion    related to surgery   Hypertension    Mitral regurgitation    Personal history of chemotherapy 2004   Pneumonia    Type II diabetes mellitus (HCC)    dx 2006    No Known Allergies  No current facility-administered medications on file prior to encounter.   Current Outpatient Medications on File Prior to Encounter  Medication Sig Dispense Refill   acetaminophen  (TYLENOL ) 650 MG CR tablet Take 1,300 mg by mouth 2 (two) times daily as needed for pain.     aspirin  81 MG chewable tablet Chew 81 mg by mouth daily.     carvedilol  (COREG ) 12.5 MG tablet Take 0.5 tablets (6.25 mg total) by mouth 2 (two) times daily with a meal. 180 tablet 1   empagliflozin  (JARDIANCE ) 10 MG TABS tablet Take 10 mg by mouth daily.     furosemide  (LASIX ) 20 MG tablet TAKE 1/2 TABLET BY  MOUTH DAILY 45 tablet 3   methocarbamol  (ROBAXIN ) 500 MG tablet Take 1,000 mg by mouth 3 (three) times daily as needed.     Multiple Vitamins-Minerals (CENTRUM SILVER 50+WOMEN PO) Take 1 tablet by mouth daily.     polyethylene glycol powder (GLYCOLAX /MIRALAX ) 17 GM/SCOOP powder as needed for moderate constipation.     rosuvastatin  (CRESTOR ) 20 MG tablet TAKE 1 TABLET BY MOUTH EVERY DAY 90 tablet 1   sacubitril -valsartan  (ENTRESTO ) 24-26 MG Take 1 tablet by mouth 2 (two) times daily. 180 tablet 2   traMADol (ULTRAM) 50 MG tablet as needed for moderate pain.     cyanocobalamin (VITAMIN B12) 1000 MCG/ML injection as directed. (Patient not taking: Reported on 01/10/2024)     fluconazole (DIFLUCAN) 150 MG tablet Take 150 mg by mouth once a week. (Patient not taking: Reported on 01/10/2024)      Physical Exam: Vitals:   01/13/24 0816 01/13/24 1422  BP: 125/64 (!) 107/56  Pulse: 69 66  Resp: 18 17  Temp: 98.3 F (36.8 C) 98.1 F (36.7 C)  SpO2: 96% 100%   Body mass index is 23.17 kg/m. Alert and oriented x 3. No shortness of breath or chest pain.   Abdomen soft and nontender.  No incontinence of bowel and bladder.  No rebound tenderness. No hip, knee, ankle pain  in the left lower extremity with passive range of motion.  No hip or ankle pain with palpation or range of motion of the right lower extremity.  Significant pain and tenderness over the right knee with even light palpation.  Mild effusion is noted.  Patient has pain when we attempt to flex or extend the knee.  No deformity of the extremity is noted. 2+ dorsalis pedis/posterior tibialis pulses bilaterally.  Compartments are soft and nontender. EHL/tibialis anterior/gastrocnemius: 5/5 motor strength in lower extremity bilaterally.  Normal sensation light touch.  Negative nerve root tension signs.  Image: MR KNEE RIGHT WO CONTRAST Result Date: 01/12/2024 CLINICAL DATA:  Knee trauma, internal derangement suspected, xray done EXAM: MRI OF  THE RIGHT KNEE WITHOUT CONTRAST TECHNIQUE: Multiplanar, multisequence MR imaging of the knee was performed. No intravenous contrast was administered. COMPARISON:  CT of the right knee dated 01/10/2024 FINDINGS: MENISCI Medial: Macerated and diminutive appearance of the medial meniscus with complex tear of the remnant body extending to the anterior and posterior horns. Lateral: Degeneration the lateral meniscus without discrete tear. LIGAMENTS Cruciates: ACL and PCL are intact. Collaterals: Medial collateral ligament is intact. Lateral collateral ligament complex is intact. CARTILAGE Patellofemoral: Thinning of the medial patellar facet articular cartilage with superimposed high-grade fissuring. High-grade fissuring of the medial trochlear facet articular cartilage. Medial: Moderate to advanced cartilage loss of the medial femorotibial compartment with marginal subchondral cystic changes. Lateral: Mild partial-thickness cartilage loss of the lateral femorotibial compartment. JOINT: Large joint effusion with synovitis. There is trace layering intermediate T2 fluid in the suprapatellar recess, which could represent a small volume of intra-articular blood products (series 3, image 7). Normal Hoffa's fat-pad. No plical thickening. POPLITEAL FOSSA: Popliteus tendon is intact. No Baker's cyst. EXTENSOR MECHANISM: Intact quadriceps tendon. Intact patellar tendon. Discontinuity of the medial patellar retinaculum. Lateral patellar retinaculum appears intact. BONES: No discrete fracture. No dislocation. Tricompartmental osteophytosis. No aggressive osseous lesion. Other: No loculated fluid collection. IMPRESSION: 1. Macerated and diminutive appearance of the medial meniscus with complex tear of the remnant body extending to the anterior and posterior horns. 2. Degeneration the lateral meniscus. 3. Tricompartmental osteoarthritis, as above, with moderate to advanced cartilage loss of the medial femorotibial compartment. 4.  Large joint effusion with synovitis and trace layering intermediate signal intensity fluid in the suprapatellar recess, which could represent a small volume of intra-articular blood products secondary to trauma. 5. Disruption of the medial patellar retinaculum. Electronically Signed   By: Harrietta Sherry M.D.   On: 01/12/2024 09:44   CT Knee Right Wo Contrast Result Date: 01/10/2024 CLINICAL DATA:  Knee trauma, occult fracture suspected, xray done EXAM: CT OF THE RIGHT KNEE WITHOUT CONTRAST TECHNIQUE: Multidetector CT imaging of the right knee was performed according to the standard protocol. Multiplanar CT image reconstructions were also generated. RADIATION DOSE REDUCTION: This exam was performed according to the departmental dose-optimization program which includes automated exposure control, adjustment of the mA and/or kV according to patient size and/or use of iterative reconstruction technique. COMPARISON:  X-ray right knee 01/10/2024. FINDINGS: Bones/Joint/Cartilage Diffusely decreased bone density. No acute fracture. Moderate volume joint effusion. At least moderate tricompartmental degenerative changes of the knee. No aggressive appearing focal bone abnormality. Soft tissues are unremarkable. Ligaments Suboptimally assessed by CT. Muscles and Tendons Grossly unremarkable. Soft tissues Atherosclerotic plaque. IMPRESSION: 1. No acute displaced fracture or dislocation. 2. At least moderate tricompartmental degenerative changes of the knee. 3. Moderate joint effusion. 4. Atherosclerotic plaque. Electronically Signed   By: Morgane  Naveau  M.D.   On: 01/10/2024 13:29   CT Cervical Spine Wo Contrast Result Date: 01/10/2024 EXAM: CT CERVICAL SPINE WITHOUT CONTRAST 01/10/2024 11:49:37 AM TECHNIQUE: CT of the cervical spine was performed without the administration of intravenous contrast. Multiplanar reformatted images are provided for review. Automated exposure control, iterative reconstruction, and/or weight  based adjustment of the mA/kV was utilized to reduce the radiation dose to as low as reasonably achievable. COMPARISON: None available. CLINICAL HISTORY: Neck trauma (Age >= 65y). FINDINGS: CERVICAL SPINE: BONES AND ALIGNMENT: No acute fracture or traumatic malalignment. DEGENERATIVE CHANGES: Multilevel degenerative changes are present in the cervical spine. Moderate-to-severe foraminal stenosis is present bilaterally at C3-4, C4-5, and C5-6. SOFT TISSUES: No prevertebral soft tissue swelling. Atherosclerotic calcifications are present. Partial thyroidectomy noted. Aortic stent graft is in place. IMPRESSION: 1. No acute abnormality of the cervical spine related to the reported neck trauma. 2. 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 Electronically signed by: Lonni Necessary MD 01/10/2024 12:15 PM EDT RP Workstation: [id number]   DG Chest 1 View Result Date: 01/10/2024 CLINICAL DATA:  Clemens yesterday EXAM: CHEST  1 VIEW COMPARISON:  07/05/2022 FINDINGS: Single frontal view of the chest demonstrates stable postsurgical changes from median sternotomy and thoracic aorta endoluminal stent graft. Cardiac silhouette is enlarged but stable. No acute airspace disease, effusion, or pneumothorax. No acute displaced fracture. IMPRESSION: 1. No acute intrathoracic process. 2. Stable cardiomegaly and thoracic aortic endoluminal stent graft repair. Electronically Signed   By: Ozell Daring M.D.   On: 01/10/2024 12:09   CT Head Wo Contrast Result Date: 01/10/2024 EXAM: CT HEAD WITHOUT CONTRAST 01/10/2024 11:49:37 AM TECHNIQUE: CT of the head was performed without the administration of intravenous contrast. Automated exposure control, iterative reconstruction, and/or weight based adjustment of the mA/kV was utilized to reduce the radiation dose to as low as reasonably achievable. COMPARISON: None available. CLINICAL HISTORY: Head  trauma, minor (Age >= 65y). FINDINGS: BRAIN AND VENTRICLES: No acute hemorrhage. Gray-white differentiation is preserved. No hydrocephalus. No extra-axial collection. No mass effect or midline shift. Periventricular and subcortical white matter hypoattenuation is mildly advanced for age. ORBITS: No acute abnormality. SINUSES: Left maxillary antrostomy and partial ethmoidectomy noted. Residual diffuse mucosal thickening present within the left maxillary sinus and ethmoid air cells. Fluid is present in the left frontal sinus. Secretions are present in the left sphenoid sinus. SOFT TISSUES AND SKULL: No acute soft tissue abnormality. No skull fracture. Mastoid air cells are clear. IMPRESSION: 1. No acute intracranial abnormality related to minor head trauma. 2. Mildly advanced periventricular and subcortical white matter hypoattenuation for age. 3. Post-surgical changes in the left maxillary sinus and ethmoid air cells with residual mucosal thickening. Fluid in the left frontal sinus and secretions in the left sphenoid sinus. Electronically signed by: Lonni Necessary MD 01/10/2024 12:07 PM EDT RP Workstation: [id number]   DG Pelvis 1-2 Views Result Date: 01/10/2024 CLINICAL DATA:  Ground level fall EXAM: PELVIS - 1-2 VIEW COMPARISON:  None Available. FINDINGS: There is no evidence of pelvic fracture or diastasis. No pelvic bone lesions are seen. IMPRESSION: No fracture Electronically Signed   By: Jackquline Boxer M.D.   On: 01/10/2024 12:06   DG Knee Complete 4 Views Right Result Date: 01/10/2024 EXAM: 4 VIEW(S) XRAY OF THE RIGHT KNEE 01/10/2024 11:47:00 AM COMPARISON: None available. CLINICAL HISTORY: Fall. Per ED triage notes: Pt BIB [facility] from home for a ground level fall last night, trying to get out of  bed, right knee gave out. Denies hitting head, LOC, c/o right hip and knee pain. 100 mcg fentanyl  PTA; ; 120/48; HR 60; 98% ; CBG 153; Best obtainable images due to pt condition. FINDINGS: BONES AND  JOINTS: No signs of acute fracture or subluxation. 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 Moderate-sized suprapatellar joint effusion. SOFT TISSUES: Vascular calcifications noted. IMPRESSION: 1. No acute fracture or subluxation. 2. Moderate-sized suprapatellar joint effusion. 3. 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 Electronically signed by: Waddell Calk MD 01/10/2024 12:03 PM EDT RP Workstation: [id number]   DG Femur Min 2 Views Right Result Date: 01/10/2024 CLINICAL DATA:  Ground level fall EXAM: RIGHT FEMUR 2 VIEWS COMPARISON:  None Available. FINDINGS: There is no evidence of fracture or other focal bone lesions. Soft tissues are unremarkable. IMPRESSION: No fracture or dislocation Electronically Signed   By: Jackquline Boxer M.D.   On: 01/10/2024 12:00    A/P: Schuyler is a very pleasant 83 year old woman with right knee pain following a ground-level fall.  Clinically she has no signs or symptoms of radiculopathy or acute bony pathology to the knee.  The MRI of the knee demonstrates degenerative meniscal tears, tricompartmental arthritis, and a effusion.  No signs of infection or abscess formation.  At this point in time there is no indication for acute surgical intervention.  Recommended pain medical management and physical therapy for progressive mobilization.  Patient to follow-up at [facility] with her treating orthopedic surgeon as an outpatient.  Will sign off.  If any issues or problems arise please do not hesitate to contact me.

## 2024-01-14 DIAGNOSIS — Z853 Personal history of malignant neoplasm of breast: Secondary | ICD-10-CM | POA: Diagnosis not present

## 2024-01-14 DIAGNOSIS — S8991XS Unspecified injury of right lower leg, sequela: Secondary | ICD-10-CM | POA: Diagnosis not present

## 2024-01-14 DIAGNOSIS — M1712 Unilateral primary osteoarthritis, left knee: Secondary | ICD-10-CM | POA: Diagnosis present

## 2024-01-14 DIAGNOSIS — S83262D Peripheral tear of lateral meniscus, current injury, left knee, subsequent encounter: Secondary | ICD-10-CM | POA: Diagnosis not present

## 2024-01-14 DIAGNOSIS — Z79899 Other long term (current) drug therapy: Secondary | ICD-10-CM | POA: Diagnosis not present

## 2024-01-14 DIAGNOSIS — Z860100 Personal history of colon polyps, unspecified: Secondary | ICD-10-CM | POA: Diagnosis not present

## 2024-01-14 DIAGNOSIS — Z7982 Long term (current) use of aspirin: Secondary | ICD-10-CM | POA: Diagnosis not present

## 2024-01-14 DIAGNOSIS — I5042 Chronic combined systolic (congestive) and diastolic (congestive) heart failure: Secondary | ICD-10-CM | POA: Diagnosis present

## 2024-01-14 DIAGNOSIS — S83262A Peripheral tear of lateral meniscus, current injury, left knee, initial encounter: Secondary | ICD-10-CM | POA: Diagnosis present

## 2024-01-14 DIAGNOSIS — Z952 Presence of prosthetic heart valve: Secondary | ICD-10-CM | POA: Diagnosis not present

## 2024-01-14 DIAGNOSIS — S83206D Unspecified tear of unspecified meniscus, current injury, right knee, subsequent encounter: Secondary | ICD-10-CM | POA: Diagnosis not present

## 2024-01-14 DIAGNOSIS — N1832 Chronic kidney disease, stage 3b: Secondary | ICD-10-CM | POA: Diagnosis present

## 2024-01-14 DIAGNOSIS — Z751 Person awaiting admission to adequate facility elsewhere: Secondary | ICD-10-CM | POA: Diagnosis not present

## 2024-01-14 DIAGNOSIS — M25569 Pain in unspecified knee: Secondary | ICD-10-CM | POA: Diagnosis present

## 2024-01-14 DIAGNOSIS — M25561 Pain in right knee: Secondary | ICD-10-CM | POA: Diagnosis not present

## 2024-01-14 DIAGNOSIS — M25462 Effusion, left knee: Secondary | ICD-10-CM | POA: Diagnosis present

## 2024-01-14 DIAGNOSIS — Z9221 Personal history of antineoplastic chemotherapy: Secondary | ICD-10-CM | POA: Diagnosis not present

## 2024-01-14 DIAGNOSIS — E1122 Type 2 diabetes mellitus with diabetic chronic kidney disease: Secondary | ICD-10-CM | POA: Diagnosis present

## 2024-01-14 DIAGNOSIS — Y92003 Bedroom of unspecified non-institutional (private) residence as the place of occurrence of the external cause: Secondary | ICD-10-CM | POA: Diagnosis not present

## 2024-01-14 DIAGNOSIS — I130 Hypertensive heart and chronic kidney disease with heart failure and stage 1 through stage 4 chronic kidney disease, or unspecified chronic kidney disease: Secondary | ICD-10-CM | POA: Diagnosis present

## 2024-01-14 DIAGNOSIS — N1831 Chronic kidney disease, stage 3a: Secondary | ICD-10-CM | POA: Diagnosis present

## 2024-01-14 DIAGNOSIS — W06XXXA Fall from bed, initial encounter: Secondary | ICD-10-CM | POA: Diagnosis present

## 2024-01-14 DIAGNOSIS — E7800 Pure hypercholesterolemia, unspecified: Secondary | ICD-10-CM | POA: Diagnosis present

## 2024-01-14 DIAGNOSIS — D631 Anemia in chronic kidney disease: Secondary | ICD-10-CM | POA: Diagnosis present

## 2024-01-14 DIAGNOSIS — I429 Cardiomyopathy, unspecified: Secondary | ICD-10-CM | POA: Diagnosis present

## 2024-01-14 DIAGNOSIS — R2689 Other abnormalities of gait and mobility: Secondary | ICD-10-CM | POA: Diagnosis present

## 2024-01-14 DIAGNOSIS — I351 Nonrheumatic aortic (valve) insufficiency: Secondary | ICD-10-CM | POA: Diagnosis present

## 2024-01-14 DIAGNOSIS — Z7984 Long term (current) use of oral hypoglycemic drugs: Secondary | ICD-10-CM | POA: Diagnosis not present

## 2024-01-14 DIAGNOSIS — S8991XA Unspecified injury of right lower leg, initial encounter: Secondary | ICD-10-CM | POA: Diagnosis not present

## 2024-01-14 LAB — GLUCOSE, CAPILLARY
Glucose-Capillary: 128 mg/dL — ABNORMAL HIGH (ref 70–99)
Glucose-Capillary: 118 mg/dL — ABNORMAL HIGH (ref 70–99)
Glucose-Capillary: 131 mg/dL — ABNORMAL HIGH (ref 70–99)
Glucose-Capillary: 116 mg/dL — ABNORMAL HIGH (ref 70–99)
Glucose-Capillary: 147 mg/dL — ABNORMAL HIGH (ref 70–99)

## 2024-01-14 MED ORDER — OXYCODONE HCL 5 MG PO TABS: 2.5000 mg | ORAL_TABLET | ORAL | Status: DC

## 2024-01-14 MED ORDER — OXYCODONE HCL 5 MG PO TABS
2.5000 mg | ORAL_TABLET | ORAL | Status: DC
  Administered 2024-01-14 – 2024-01-15 (×8): 2.5 mg via ORAL
  Filled 2024-01-14 (×9): qty 1

## 2024-01-14 NOTE — Progress Notes (Signed)
 Physical Therapy Treatment Patient Details Name: Brittany Greene MRN: [id number] DOB: [birth year] Today's Date: 01/14/2024   History of Present Illness Pt is 83 yo female who presents on 01/10/24 after a ground level fall getting out of bed, secondary to R knee giving out. Pt with R hip and knee pain. R knee effusion, no fracture. 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    PT Comments  Pt resting in bed on arrival, pleasant and agreeable to session with continued progress towards acute goals. Pt able to progress gait distance slightly this session with RW for support and up to min A to maintain balance with turning. Pt continues to require step by step commands for sequencing and optimal hand placement during gait and transfers with pt demonstrating poor carryover/short term memory deficits. Pt continues to be limited in safe mobility by pain, decreased activity tolerance and poor balance/postural reactions and will benefit from continued inpatient follow up therapy, <3 hours/day  address deficits and maximize functional independence and decrease caregiver burden. Pt continues to benefit from skilled PT services to progress toward functional mobility goals.     If plan is discharge home, recommend the following: A lot of help with walking and/or transfers;A little help with bathing/dressing/bathroom;Assistance with cooking/housework;Assist for transportation   Can travel by private vehicle     No  Equipment Recommendations  Rolling walker (2 wheels);BSC/3in1    Recommendations for Other Services       Precautions / Restrictions Precautions Precautions: Fall;Knee Precaution Booklet Issued: No Recall of Precautions/Restrictions: Impaired Precaution/Restrictions Comments: reviewed use of KI for mobility but the need to come out of it intermittently for knee ROM Required Braces or  Orthoses: Knee Immobilizer - Right Knee Immobilizer - Right: Other (comment) (not specified in orders) Restrictions Weight Bearing Restrictions Per Provider Order: No     Mobility  Bed Mobility Overal bed mobility: Needs Assistance Bed Mobility: Supine to Sit     Supine to sit: Min assist     General bed mobility comments: min A to manage RLE to and off L EOB    Transfers Overall transfer level: Needs assistance Equipment used: Rolling walker (2 wheels) Transfers: Sit to/from Stand, Bed to chair/wheelchair/BSC Sit to Stand: Contact guard assist           General transfer comment: CGA for safety with light hands on to facilitate anteiror weight shift on rise, x2 attempts to achieve.    Ambulation/Gait Ambulation/Gait assistance: Contact guard assist, Min assist Gait Distance (Feet): 18 Feet Assistive device: Rolling walker (2 wheels) Gait Pattern/deviations: Step-to pattern, Decreased stance time - right, Decreased weight shift to right Gait velocity: decreased     General Gait Details: max cues for sequencing and UE use on RW, pt able to bear increased weight through RLE this session, needing max cues for sequencing and RW management and min A to maintain balance with turning   Stairs             Wheelchair Mobility     Tilt Bed    Modified Rankin (Stroke Patients Only)       Balance Overall balance assessment: Needs assistance, History of Falls Sitting-balance support: Feet supported, Single extremity supported Sitting balance-Leahy Scale: Fair     Standing balance support: Bilateral upper extremity supported, During functional activity, Reliant on assistive device for balance Standing balance-Leahy Scale: Poor Standing balance comment: heavy  reliance on RW and external support                            Communication Communication Communication: No apparent difficulties  Cognition Arousal: Alert Behavior During Therapy: WFL for  tasks assessed/performed                           PT - Cognition Comments: mild STM deficits noted with pt needing contineud cues for sequencing Following commands: Intact      Cueing Cueing Techniques: Verbal cues  Exercises General Exercises - Lower Extremity Ankle Circles/Pumps: AROM, Both, 10 reps, Supine Heel Slides: AAROM, Right, 10 reps, Seated    General Comments General comments (skin integrity, edema, etc.): donned KI for mobility only, doffed once up to chair and ice applied, reviewed removing KI when not completing mobility      Pertinent Vitals/Pain Pain Assessment Pain Assessment: Faces Faces Pain Scale: Hurts little more Pain Location: R knee with mobility Pain Descriptors / Indicators: Discomfort, Grimacing, Guarding, Sore Pain Intervention(s): RN gave pain meds during session, Monitored during session, Limited activity within patient's tolerance, Repositioned, Ice applied    Home Living                          Prior Function            PT Goals (current goals can now be found in the care plan section) Acute Rehab PT Goals Patient Stated Goal: figure out what is wrong with knee, what made it buckle and why is still hurts so badly PT Goal Formulation: With patient/family Time For Goal Achievement: 01/25/24 Progress towards PT goals: Progressing toward goals    Frequency    Min 2X/week      PT Plan      Co-evaluation              AM-PAC PT 6 Clicks Mobility   Outcome Measure  Help needed turning from your back to your side while in a flat bed without using bedrails?: A Lot Help needed moving from lying on your back to sitting on the side of a flat bed without using bedrails?: A Lot Help needed moving to and from a bed to a chair (including a wheelchair)?: A Little Help needed standing up from a chair using your arms (e.g., wheelchair or bedside chair)?: A Little Help needed to walk in hospital room?: A Lot  (<20') Help needed climbing 3-5 steps with a railing? : Total 6 Click Score: 13    End of Session Equipment Utilized During Treatment: Gait belt;Right knee immobilizer Activity Tolerance: Patient limited by pain;Patient tolerated treatment well Patient left: with call bell/phone within reach;in chair Nurse Communication: Mobility status PT Visit Diagnosis: Pain;Difficulty in walking, not elsewhere classified (R26.2) Pain - Right/Left: Right Pain - part of body: Knee     Time: 8894-8868 PT Time Calculation (min) (ACUTE ONLY): 26 min  Charges:    $Gait Training: 8-22 mins $Therapeutic Activity: 8-22 mins PT General Charges $$ ACUTE PT VISIT: 1 Visit                     Dong Nimmons R. PTA Acute Rehabilitation Services Office: [phone]   Therisa CHRISTELLA Boor 01/14/2024, 1:55 PM

## 2024-01-14 NOTE — Progress Notes (Addendum)
     Daily Progress Note Intern Pager: [phone]  Patient name: Brittany Greene Medical record number: [id number] Date of birth: 11/27/1940 Age: 83 y.o. Gender: female  Primary Care Provider: Cristopher Suzen HERO, NP Consultants: ortho (s/o) Code Status: FULL  Pt Overview and Major Events to Date:  8/17: admitted 8/20: knee aspiration  Medical Decision Making: Krina Mraz is a 83 y.o. female presenting with R knee pain and effusion s/p mechanical fall. Admitted for pain control. S/p knee aspiration & injection. Assessment & Plan Knee pain Injury of right knee Fall S/p knee aspiration & injection. Secondary to mechanical fall from level of bed onto knees.  - Admit to MedSurg under Dr. Madelon  - Pain management with 1000mg  Tylenol  q6h; 2.5mg  oxycodone  q4h scheduled for 48h, miralax  scheduled daily for bowel regimen - PT/OT  - Encourage mobility - 1 day s/p knee aspiration & lidocaine /steroid injection  Chronic health problem Chronic combined systolic and diastolic CHF: continue home furosemide  10mg  daily, home carvedilol  6.25mg  BID, home Jardiance  10mg  daily, and Entresto  24-26mg  BID HLD: continue home rosuvastatin  20mg   Anemia: stable, Hgb 8.6    FEN/GI: Regular PPx: Lovenox  Dispo:SNF. Barriers include insurance approval.   Subjective:  Patient reports she feels her pain has improved since her knee was aspirated.  She was seen by orthopedics yesterday, who did not see indication for acute surgery and recommended pain management and PT for progressive mobilization.  Per PT, patient still needs assist to come to stand and is able to bear more weight than the previous day. She is able to ambulate up to 83' with constant hands assist to maintain balance and using rolling walker for support. She endorsed constant pain with this and is still experiencing intense pain with passive ROM with scheduled tylenol  and PRN oxy. PT recommending SNF placement.   Objective: Temp:  [97.6 F (36.4  C)-98.3 F (36.8 C)] 97.8 F (36.6 C) (08/21 0438) Pulse Rate:  [63-69] 63 (08/20 1933) Resp:  [15-18] 15 (08/21 0438) BP: (107-128)/(56-68) 128/63 (08/21 0438) SpO2:  [96 %-100 %] 99 % (08/20 1933) Physical Exam: General: Resting in bed, NAD Cardiovascular: RRR Respiratory: CTAB, normal work of breathing on room air Abdomen: Soft, nontender, nondistended Extremities: Right knee tender to palpation, intense pain with passive knee flexion  Laboratory: Most recent CBC Lab Results  Component Value Date   WBC 6.4 01/11/2024   HGB 8.6 (L) 01/11/2024   HCT 27.5 (L) 01/11/2024   MCV 79.5 (L) 01/11/2024   PLT 187 01/11/2024   Most recent BMP    Latest Ref Rng & Units 01/10/2024   11:13 AM  BMP  Glucose 70 - 99 mg/dL 873   BUN 8 - 23 mg/dL 24   Creatinine 9.55 - 1.00 mg/dL 8.47   Sodium 864 - 854 mmol/L 138   Potassium 3.5 - 5.1 mmol/L 3.6   Chloride 98 - 111 mmol/L 108   CO2 22 - 32 mmol/L 21   Calcium  8.9 - 10.3 mg/dL 8.3     Idelle Nakai, DO 01/14/2024, 7:35 AM  PGY-1, [facility] FPTS Intern pager: [phone], text pages welcome Secure chat group [facility] Teaching Service

## 2024-01-14 NOTE — TOC Progression Note (Addendum)
 Transition of Care [facility]) - Progression Note    Patient Details  Name: Brittany Greene MRN: [id number] Date of Birth: [birth year]  [facility]) CM/SW Contact  Bridget Cordella Simmonds, LCSW Phone Number: 01/14/2024, 10:53 AM  Clinical Narrative:   CSW spoke with pt with daughter Brittany Greene on speakerphone.  Pt reports her knee is feeling a little better today and she was able to walk a short distance in the room with PT.  Discussed that medicare will still not pay for SNF due to her observation status, which UR reports will not be changed.  Discussed home options and Brittany Greene reports that she will be gone working 13 hours per day, thinks she would need someone at home with pt for 8 of those hours.  We discussed that [facility] with pt medicare will be able to give her max services but this may or may not be every day and would likely only be about one hour per day.  Brittany Greene and pt asking if pt supplemental AARP policy would cover any HH aide services and CSW will inquire.  Discussed private pay for [facility] aide services and Brittany Greene reports they would be able to afford to pay for some help but likely not 8 hours worth.  CSW spoke with RNCM who will get involved to work towards home DC.  1315: CSW informed that pt admission status changed to inpt.  CSW spoke with pt and she does want to pursue SNF.  Permission given to send out referral in the hub.  Medicare choice document provided.  Expected Discharge Plan:  (TBD) Barriers to Discharge: Other (must enter comment), SNF Pending bed offer (will not have required inpt stay for DC to SNF)               Expected Discharge Plan and Services In-house Referral: Clinical Social Work   Post Acute Care Choice:  (TBD) Living arrangements for the past 2 months: Single Family Home                                       Social Drivers of Health (SDOH) Interventions SDOH Screenings   Food Insecurity: No Food Insecurity (01/11/2024)  Housing: Low Risk   (01/11/2024)  Transportation Needs: No Transportation Needs (01/11/2024)  Utilities: Not At Risk (01/11/2024)  Social Connections: Moderately Integrated (01/11/2024)  Tobacco Use: Medium Risk (01/10/2024)    Readmission Risk Interventions     No data to display

## 2024-01-14 NOTE — NC FL2 (Signed)
 [place]  MEDICAID FL2 LEVEL OF CARE FORM     IDENTIFICATION  Patient Name: Brittany Greene Birthdate: 1940/09/13 Sex: female Admission Date (Current Location): 01/10/2024  [facility] and [place] Number:  Producer, television/film/video and Address:  [facility], [address], [place], [place] [address]      Provider Number: [id number]  Attending Physician Name and Address:  Anders Otto DASEN, MD  Relative Name and Phone Number:  Itzelle, Gains Daughter   [phone]    Current Level of Care: Hospital Recommended Level of Care: Skilled Nursing Facility Prior Approval Number:    Date Approved/Denied:   PASRR Number: [id number]  Discharge Plan: SNF    Current Diagnoses: Patient Active Problem List   Diagnosis Date Noted   Inability to bear weight 01/14/2024   Injury of right knee 01/11/2024   Fall 01/11/2024   Knee pain 01/10/2024   Chronic health problem 01/10/2024   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/25/2022   Chronic combined systolic and diastolic CHF (congestive heart failure) (HCC) 02/26/2022   Hyperlipidemia LDL goal <70    DCM (dilated cardiomyopathy) (HCC)    CHF exacerbation (HCC) 11/10/2021   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   Type 2 diabetes mellitus with hyperlipidemia (HCC) 11/10/2021   Hyperlipidemia 11/10/2021   Hypertension 11/10/2021   Constipation 01/18/2019   Personal history of breast cancer 07/31/2016   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/09/2016   S/P ascending aortic aneurysm repair 03/06/2015   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    Orientation RESPIRATION BLADDER Height & Weight     Self, Time, Situation, Place  Normal Continent Weight: 135 lb (61.2 kg) Height:  5' 4 (162.6 cm)  BEHAVIORAL SYMPTOMS/MOOD NEUROLOGICAL BOWEL NUTRITION STATUS      Continent Diet (see discharge summary)  AMBULATORY STATUS COMMUNICATION OF NEEDS Skin   Limited  Assist Verbally                         Personal Care Assistance Level of Assistance  Bathing, Feeding, Dressing Bathing Assistance: Limited assistance Feeding assistance: Independent Dressing Assistance: Limited assistance     Functional Limitations Info  Sight, Hearing, Speech Sight Info: Adequate Hearing Info: Adequate Speech Info: Adequate    SPECIAL CARE FACTORS FREQUENCY  PT (By licensed PT), OT (By licensed OT)     PT Frequency: 5x week OT Frequency: 5x week            Contractures Contractures Info: Not present    Additional Factors Info  Code Status, Allergies, Insulin  Sliding Scale Code Status Info: full Allergies Info: NKA   Insulin  Sliding Scale Info: novolog        Current Medications (01/14/2024):  This is the current hospital active medication list Current Facility-Administered Medications  Medication Dose Route Frequency Provider Last Rate Last Admin   acetaminophen  (TYLENOL ) tablet 1,000 mg  1,000 mg Oral Q6H Faunce, Alina, DO   1,000 mg at 01/14/24 1105   aspirin  chewable tablet 81 mg  81 mg Oral Daily Lupie Credit, DO   81 mg at 01/14/24 1104   carvedilol  (COREG ) tablet 6.25 mg  6.25 mg Oral BID WC Majeed, Sara, DO   6.25 mg at 01/14/24 1105   empagliflozin  (JARDIANCE ) tablet 10 mg  10 mg Oral Daily Majeed, Sara, DO   10 mg at 01/14/24 1105   enoxaparin  (LOVENOX ) injection 30 mg  30 mg Subcutaneous Q24H Lupie Credit, DO  30 mg at 01/13/24 2140   furosemide  (LASIX ) tablet 10 mg  10 mg Oral Daily Majeed, Sara, DO   10 mg at 01/14/24 1104   insulin  aspart (novoLOG ) injection 0-9 Units  0-9 Units Subcutaneous TID WC Alena Morrison, Reagan, MD   1 Units at 01/14/24 [phone]   oxyCODONE  (Oxy IR/ROXICODONE ) immediate release tablet 2.5 mg  2.5 mg Oral Q4H while awake Everhart, Kirstie, DO   2.5 mg at 01/14/24 1405   polyethylene glycol (MIRALAX  / GLYCOLAX ) packet 17 g  17 g Oral Daily Faunce, Alina, DO   17 g at 01/14/24 1105   rosuvastatin  (CRESTOR )  tablet 20 mg  20 mg Oral Daily Majeed, Sara, DO   20 mg at 01/14/24 1105   sacubitril -valsartan  (ENTRESTO ) 24-26 mg per tablet  1 tablet Oral BID Lupie Credit, DO   1 tablet at 01/14/24 1105     Discharge Medications: Please see discharge summary for a list of discharge medications.  Relevant Imaging Results:  Relevant Lab Results:   Additional Information SSN: [id number]  Bridget Cordella Simmonds, LCSW

## 2024-01-14 NOTE — Assessment & Plan Note (Addendum)
 S/p knee aspiration & injection. Secondary to mechanical fall from level of bed onto knees.  - Admit to MedSurg under Dr. Rumball  - Pain management with 1000mg  Tylenol  q6h; 2.5mg  oxycodone  q4h scheduled for 48h, miralax  scheduled daily for bowel regimen - PT/OT  - Encourage mobility - 1 day s/p knee aspiration & lidocaine /steroid injection

## 2024-01-14 NOTE — Assessment & Plan Note (Signed)
 Chronic combined systolic and diastolic CHF: continue home furosemide  10mg  daily, home carvedilol  6.25mg  BID, home Jardiance  10mg  daily, and Entresto  24-26mg  BID HLD: continue home rosuvastatin  20mg   Anemia: stable, Hgb 8.6

## 2024-01-14 NOTE — Progress Notes (Signed)
 Occupational Therapy Treatment Patient Details Name: Brittany Greene MRN: [id number] DOB: 11/30/1940 Today's Date: 01/14/2024   History of present illness Pt is 83 yo female who presents on 01/10/24 after a ground level fall getting out of bed, secondary to R knee giving out. Pt with R hip and knee pain. R knee effusion, no fracture. 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   OT comments  Pt making progress with functional goals. Pt reports 3/10 R knee pain at rest, stood from chair to RW min A, walked to bathroom CGA, transferred to toilet using grab bar for safety CGA, min A/CGA st stand for clothing mgt and hygiene. Pt stood at sink for grooming, hygiene and returned to chair to participated in LB ADLs mod A. Pt reports pain in R knee increasing to 7/10 (pt had been pre medicated). Pt with VERY slow, guarded movement  during functional mobility with RW. OT will continue to follow acutely to maximize level of function and safety      If plan is discharge home, recommend the following:  A little help with walking and/or transfers;A lot of help with bathing/dressing/bathroom;Assistance with cooking/housework;Help with stairs or ramp for entrance;Assist for transportation   Equipment Recommendations  BSC/3in1;Other (comment);Tub/shower seat (RW)    Recommendations for Other Services      Precautions / Restrictions Precautions Precautions: Fall;Knee Precaution Booklet Issued: No Recall of Precautions/Restrictions: Impaired Precaution/Restrictions Comments: reviewed use of KI for mobility but the need to come out of it intermittently for knee ROM Required Braces or Orthoses: Knee Immobilizer - Right Knee Immobilizer - Right: Other (comment) (not specified in orders) Restrictions Weight Bearing Restrictions Per Provider Order: No       Mobility Bed Mobility                General bed mobility comments: pt up in chair on arrival and at end of session    Transfers Overall transfer level: Needs assistance Equipment used: Rolling walker (2 wheels) Transfers: Sit to/from Stand, Bed to chair/wheelchair/BSC Sit to Stand: Min assist     Step pivot transfers: Min assist     General transfer comment: min A from chair, min A/CGA from toilet using grab bars     Balance Overall balance assessment: Needs assistance, History of Falls Sitting-balance support: Feet supported, Single extremity supported Sitting balance-Leahy Scale: Fair     Standing balance support: Bilateral upper extremity supported, During functional activity, Reliant on assistive device for balance Standing balance-Leahy Scale: Poor                             ADL either performed or assessed with clinical judgement   ADL Overall ADL's : Needs assistance/impaired     Grooming: Wash/dry hands;Wash/dry face;Contact guard assist;Standing               Lower Body Dressing: Moderate assistance;Cueing for compensatory techniques;Sitting/lateral leans;Sit to/from stand   Toilet Transfer: Contact guard assist;Minimal assistance;Ambulation;Rolling walker (2 wheels);Regular Toilet;Grab bars   Toileting- Clothing Manipulation and Hygiene: Contact guard assist       Functional mobility during ADLs: Contact guard assist;Minimal assistance;Rolling walker (2 wheels) General ADL Comments: poor tolerance of any weightbearing on RLE    Extremity/Trunk Assessment Upper Extremity Assessment Upper Extremity Assessment: Overall WFL for tasks assessed   Lower Extremity Assessment Lower Extremity Assessment: Defer  to PT evaluation   Cervical / Trunk Assessment Cervical / Trunk Assessment: Normal    Vision Ability to See in Adequate Light: 0 Adequate Patient Visual Report: No change from baseline     Perception     Praxis     Communication Communication Communication: No  apparent difficulties   Cognition Arousal: Alert Behavior During Therapy: WFL for tasks assessed/performed Cognition: No apparent impairments                               Following commands: Intact        Cueing   Cueing Techniques: Verbal cues  Exercises      Shoulder Instructions       General Comments donned KI for mobility only, doffed once up to chair and ice applied, reviewed removing KI when not completing mobility    Pertinent Vitals/ Pain       Pain Assessment Pain Assessment: 0-10 Pain Score: 7  Pain Location: R knee with mobility; 3/10 at rest Pain Descriptors / Indicators: Discomfort, Grimacing, Guarding, Sore Pain Intervention(s): Limited activity within patient's tolerance, Monitored during session, Premedicated before session, Repositioned, Ice applied  Home Living                                          Prior Functioning/Environment              Frequency  Min 2X/week        Progress Toward Goals  OT Goals(current goals can now be found in the care plan section)  Progress towards OT goals: Progressing toward goals     Plan      Co-evaluation                 AM-PAC OT 6 Clicks Daily Activity     Outcome Measure   Help from another person eating meals?: None Help from another person taking care of personal grooming?: A Little Help from another person toileting, which includes using toliet, bedpan, or urinal?: A Little Help from another person bathing (including washing, rinsing, drying)?: A Lot Help from another person to put on and taking off regular upper body clothing?: A Little Help from another person to put on and taking off regular lower body clothing?: A Lot 6 Click Score: 17    End of Session Equipment Utilized During Treatment: Gait belt;Rolling walker (2 wheels)  OT Visit Diagnosis: Unsteadiness on feet (R26.81);Muscle weakness (generalized) (M62.81);History of falling  (Z91.81);Pain Pain - Right/Left: Right Pain - part of body: Knee   Activity Tolerance Patient tolerated treatment well   Patient Left in chair;with call bell/phone within reach   Nurse Communication Mobility status        Time: 8853-8786 OT Time Calculation (min): 27 min  Charges: OT General Charges $OT Visit: 1 Visit OT Treatments $Self Care/Home Management : 8-22 mins $Therapeutic Activity: 8-22 mins    [name] Loose 01/14/2024, 3:07 PM

## 2024-01-15 DIAGNOSIS — S8991XA Unspecified injury of right lower leg, initial encounter: Secondary | ICD-10-CM | POA: Diagnosis not present

## 2024-01-15 DIAGNOSIS — S83206A Unspecified tear of unspecified meniscus, current injury, right knee, initial encounter: Secondary | ICD-10-CM

## 2024-01-15 DIAGNOSIS — S83206D Unspecified tear of unspecified meniscus, current injury, right knee, subsequent encounter: Secondary | ICD-10-CM | POA: Diagnosis not present

## 2024-01-15 LAB — GLUCOSE, CAPILLARY
Glucose-Capillary: 113 mg/dL — ABNORMAL HIGH (ref 70–99)
Glucose-Capillary: 110 mg/dL — ABNORMAL HIGH (ref 70–99)
Glucose-Capillary: 85 mg/dL (ref 70–99)
Glucose-Capillary: 131 mg/dL — ABNORMAL HIGH (ref 70–99)

## 2024-01-15 NOTE — Assessment & Plan Note (Addendum)
 Day 2 s/p knee aspiration & injection. Secondary to mechanical fall from level of bed onto knees.  - Pain management with 1000mg  Tylenol  q6h; 2.5mg  oxycodone  q4h scheduled for 48h, miralax  scheduled daily for bowel regimen - PT/OT  - Encourage mobility

## 2024-01-15 NOTE — Plan of Care (Signed)
  Problem: Education: Goal: Knowledge of General Education information will improve Description: Including pain rating scale, medication(s)/side effects and non-pharmacologic comfort measures Outcome: Progressing   Problem: Health Behavior/Discharge Planning: Goal: Ability to manage health-related needs will improve Outcome: Progressing   Problem: Clinical Measurements: Goal: Ability to maintain clinical measurements within normal limits will improve Outcome: Progressing Goal: Diagnostic test results will improve Outcome: Progressing Goal: Respiratory complications will improve Outcome: Progressing Goal: Cardiovascular complication will be avoided Outcome: Progressing   Problem: Activity: Goal: Risk for activity intolerance will decrease Outcome: Progressing   Problem: Nutrition: Goal: Adequate nutrition will be maintained Outcome: Progressing   Problem: Coping: Goal: Level of anxiety will decrease Outcome: Progressing   Problem: Elimination: Goal: Will not experience complications related to bowel motility Outcome: Progressing Goal: Will not experience complications related to urinary retention Outcome: Progressing   Problem: Pain Managment: Goal: General experience of comfort will improve and/or be controlled Outcome: Progressing   Problem: Safety: Goal: Ability to remain free from injury will improve Outcome: Progressing   Problem: Skin Integrity: Goal: Risk for impaired skin integrity will decrease Outcome: Progressing   Problem: Education: Goal: Ability to describe self-care measures that may prevent or decrease complications (Diabetes Survival Skills Education) will improve Outcome: Progressing Goal: Individualized Educational Video(s) Outcome: Progressing   Problem: Coping: Goal: Ability to adjust to condition or change in health will improve Outcome: Progressing   Problem: Fluid Volume: Goal: Ability to maintain a balanced intake and output will  improve Outcome: Progressing   Problem: Health Behavior/Discharge Planning: Goal: Ability to identify and utilize available resources and services will improve Outcome: Progressing Goal: Ability to manage health-related needs will improve Outcome: Progressing   Problem: Metabolic: Goal: Ability to maintain appropriate glucose levels will improve Outcome: Progressing   Problem: Nutritional: Goal: Maintenance of adequate nutrition will improve Outcome: Progressing Goal: Progress toward achieving an optimal weight will improve Outcome: Progressing   Problem: Skin Integrity: Goal: Risk for impaired skin integrity will decrease Outcome: Progressing   Problem: Tissue Perfusion: Goal: Adequacy of tissue perfusion will improve Outcome: Progressing

## 2024-01-15 NOTE — Discharge Summary (Addendum)
 Family Medicine Teaching [facility] Discharge Summary  Patient name: Brittany Greene Medical record number: [id number] Date of birth: Oct 11, 1940 Age: 83 y.o. Gender: female Date of Admission: 01/10/2024  Date of Discharge: 01/18/2024 Admitting Physician: Elyce Prescott, DO  Primary Care Provider: Cristopher Suzen HERO, NP Consultants: ortho  Indication for Hospitalization: Pain management, inability to walk  Discharge Diagnoses/Problem List:  Principal Problem for Admission: Knee pain Other Problems addressed during stay:  Principal Problem:   Knee pain Active Problems:   Chronic health problem   Injury of right knee   Fall   Inability to bear weight   Tear of meniscus of right knee as current injury   Acute lateral meniscal tear with peripheral detachment  Brief Hospital Course:  Brittany Greene is an 83yo F admitted for pain control of R knee pain and effusion s/p mechanical fall. Her hospital course is outlined below:  Knee pain Presented initially after a fall getting out of bed.  Imaging of right femur, right knee, pelvis, chest without fracture.  CT head negative.  CT cervical spine negative.  CT right knee with moderate OA and moderate joint effusion. MRI knee showed medial meniscus tear, osteoarthritis, and joint effusion. Orthopedic Surgery consulted, they recommended joint aspiration, injection, pain control and PT/OT. Additionally, they recommend outpatient follow-up to discuss non-operative versus operative options. She received joint aspiration and steroid knee injection 8/20 with some improvement of pain.  Pain controlled with tylenol  and oxycodone . PT/OT evaluated and recommended SNF placement for acute physical therapy.  Follow up recommendations for PCP Consider workup of chronic anemia/repeat iron studies Ortho follow-up with [facility] Colonoscopy h/o polyps - was due for repeat in 2017 D/c with oxycodone  for pain management, take as needed, not a long term  medication   Results/Tests Pending at Time of Discharge: None  Disposition: SNF  Discharge Condition: stable  Discharge Exam:  Vitals:   01/18/24 0408 01/18/24 0807  BP: 112/60 114/63  Pulse: 67 71  Resp: 17 16  Temp: 98.4 F (36.9 C) (!) 97.1 F (36.2 C)  SpO2: 100% 96%   Significant Procedures: Knee injection/aspiration  Significant Labs and Imaging:  No results for input(s): WBC, HGB, HCT, PLT in the last 48 hours. No results for input(s): NA, K, CL, CO2, GLUCOSE, BUN, CREATININE, CALCIUM , MG, PHOS, ALKPHOS, AST, ALT, ALBUMIN, PROTEIN in the last 48 hours.  CT Knee: 1. No acute displaced fracture or dislocation. 2. At least moderate tricompartmental degenerative changes of the knee. 3. Moderate joint effusion. 4. Atherosclerotic plaque.  MRI knee: 1. Macerated and diminutive appearance of the medial meniscus with complex tear of the remnant body extending to the anterior and posterior horns. 2. Degeneration the lateral meniscus. 3. Tricompartmental osteoarthritis, as above, with moderate to advanced cartilage loss of the medial femorotibial compartment. 4. Large joint effusion with synovitis and trace layering intermediate signal intensity fluid in the suprapatellar recess, which could represent a small volume of intra-articular blood products secondary to trauma. 5. Disruption of the medial patellar retinaculum.  Discharge Medications:  Allergies as of 01/18/2024   No Known Allergies      Medication List     STOP taking these medications    cyanocobalamin 1000 MCG/ML injection Commonly known as: VITAMIN B12   fluconazole 150 MG tablet Commonly known as: DIFLUCAN   traMADol 50 MG tablet Commonly known as: ULTRAM       TAKE these medications    acetaminophen  650 MG CR tablet Commonly known as: TYLENOL  Take  1,300 mg by mouth 2 (two) times daily as needed for pain.   aspirin  81 MG chewable tablet Chew 81  mg by mouth daily.   carvedilol  12.5 MG tablet Commonly known as: COREG  Take 0.5 tablets (6.25 mg total) by mouth 2 (two) times daily with a meal.   CENTRUM SILVER 50+WOMEN PO Take 1 tablet by mouth daily.   Entresto  24-26 MG Generic drug: sacubitril -valsartan  Take 1 tablet by mouth 2 (two) times daily.   furosemide  20 MG tablet Commonly known as: LASIX  TAKE 1/2 TABLET BY MOUTH DAILY   Jardiance  10 MG Tabs tablet Generic drug: empagliflozin  Take 10 mg by mouth daily.   methocarbamol  500 MG tablet Commonly known as: ROBAXIN  Take 1,000 mg by mouth 3 (three) times daily as needed.   oxyCODONE  5 MG immediate release tablet Commonly known as: Oxy IR/ROXICODONE  Take 0.5 tablets (2.5 mg total) by mouth every 4 (four) hours as needed for up to 5 days for moderate pain (pain score 4-6).   polyethylene glycol powder 17 GM/SCOOP powder Commonly known as: GLYCOLAX /MIRALAX  as needed for moderate constipation.   rosuvastatin  20 MG tablet Commonly known as: CRESTOR  TAKE 1 TABLET BY MOUTH EVERY DAY        Discharge Instructions: Please refer to Patient Instructions section of EMR for full details.  Patient was counseled important signs and symptoms that should prompt return to medical care, changes in medications, dietary instructions, activity restrictions, and follow up appointments.   Follow-Up Appointments:  Contact information for after-discharge care     Destination     [facility] [place], [place] .   Service: Skilled Nursing Contact information: [address] [place]   [address] [phone]                     Idelle Nakai, DO 01/18/2024, 10:22 AM PGY-1, [facility]   Upper Level Addendum: I have seen and evaluated this patient along with Dr. Idelle and reviewed the above note, making necessary revisions as appropriate. I agree with the medical decision making and physical exam as noted above. Gladis Church,  DO PGY-3 [facility] Residency

## 2024-01-15 NOTE — Assessment & Plan Note (Addendum)
 Chronic combined systolic and diastolic CHF: continue home furosemide  10mg  daily, home carvedilol  6.25mg  BID, home Jardiance  10mg  daily, and Entresto  24-26mg  BID HLD: continue home rosuvastatin  20mg   Anemia: stable, Hgb 8.6

## 2024-01-15 NOTE — TOC Progression Note (Addendum)
 Transition of Care [facility]) - Progression Note    Patient Details  Name: Brittany Greene MRN: [id number] Date of Birth: 10-09-1940  Transition of Care [facility]) CM/SW Contact  Bridget Cordella Simmonds, LCSW Phone Number: 01/15/2024, 1:13 PM  Clinical Narrative:   Bed offers provided to pt, [name] on the phone: they will accept offer at [facility].    CSW confirmed with [facility] that they can receive pt.    Expected Discharge Plan:  (TBD) Barriers to Discharge: Other (must enter comment), SNF Pending bed offer (will not have required inpt stay for DC to SNF)               Expected Discharge Plan and Services In-house Referral: Clinical Social Work   Post Acute Care Choice:  (TBD) Living arrangements for the past 2 months: Single Family Home                                       Social Drivers of Health (SDOH) Interventions SDOH Screenings   Food Insecurity: No Food Insecurity (01/11/2024)  Housing: Low Risk  (01/11/2024)  Transportation Needs: No Transportation Needs (01/11/2024)  Utilities: Not At Risk (01/11/2024)  Social Connections: Moderately Integrated (01/11/2024)  Tobacco Use: Medium Risk (01/10/2024)    Readmission Risk Interventions     No data to display

## 2024-01-15 NOTE — Progress Notes (Signed)
     Daily Progress Note Intern Pager: [phone]  Patient name: Brittany Greene Medical record number: [id number] Date of birth: 29-Aug-1940 Age: 83 y.o. Gender: female  Primary Care Provider: Cristopher Suzen HERO, NP Consultants: ortho (s/o) Code Status: FULL  Pt Overview and Major Events to Date:  8/17: admitted 8/20: knee aspiration  Medical Decision Making: Brittany Greene is a 83 y.o. female presenting with R knee pain and effusion s/p mechanical fall. Admitted for pain control, 2 days s/p knee aspiration & injection.   Medically stable for discharge pending SNF placement. Assessment & Plan Knee pain Injury of right knee Fall Inability to bear weight Day 2 s/p knee aspiration & injection. Secondary to mechanical fall from level of bed onto knees.  - Pain management with 1000mg  Tylenol  q6h; 2.5mg  oxycodone  q4h scheduled for 48h, miralax  scheduled daily for bowel regimen - PT/OT  - Encourage mobility Chronic health problem Chronic combined systolic and diastolic CHF: continue home furosemide  10mg  daily, home carvedilol  6.25mg  BID, home Jardiance  10mg  daily, and Entresto  24-26mg  BID HLD: continue home rosuvastatin  20mg   Anemia: stable, Hgb 8.6  FEN/GI: Regular PPx: Lovenox  Dispo:SNF pending bed placement.  Subjective:  Patient says that her pain has much improved with the scheduled oxycodone . Patient says her physical therapy session yesterday went well and she was able to progress with gait distance with the RW for support.  He was very thankful to be continuing physical therapy sessions in the hospital while she is awaiting SNF placement.  Patient hopes to go to [facility] for SNF.  Objective: Temp:  [97.8 F (36.6 C)-98.8 F (37.1 C)] 97.8 F (36.6 C) (08/22 0449) Pulse Rate:  [56-57] 57 (08/22 0449) Resp:  [15-18] 18 (08/22 0449) BP: (113-130)/(58-71) 120/62 (08/22 0449) SpO2:  [99 %-100 %] 100 % (08/22 0449) Physical Exam: General: Sitting in bed having breakfast,  NAD Cardiovascular: RRR, no r/m/g Respiratory: CTAB, normal work of breathing on room air Abdomen: Soft, nontender, nondistended Extremities: well perfused, TTP R patella  Laboratory: Most recent CBC Lab Results  Component Value Date   WBC 6.4 01/11/2024   HGB 8.6 (L) 01/11/2024   HCT 27.5 (L) 01/11/2024   MCV 79.5 (L) 01/11/2024   PLT 187 01/11/2024   Most recent BMP    Latest Ref Rng & Units 01/10/2024   11:13 AM  BMP  Glucose 70 - 99 mg/dL 873   BUN 8 - 23 mg/dL 24   Creatinine 9.55 - 1.00 mg/dL 8.47   Sodium 864 - 854 mmol/L 138   Potassium 3.5 - 5.1 mmol/L 3.6   Chloride 98 - 111 mmol/L 108   CO2 22 - 32 mmol/L 21   Calcium  8.9 - 10.3 mg/dL 8.3    Brittany Nakai, DO 01/15/2024, 7:31 AM  PGY-1, [facility] Family Medicine FPTS Intern pager: [phone], text pages welcome Secure chat group [facility] Teaching Service

## 2024-01-15 NOTE — Progress Notes (Signed)
 Occupational Therapy Treatment Patient Details Name: Brittany Greene MRN: [id number] DOB: 1941/02/04 Today's Date: 01/15/2024   History of present illness Pt is 83 yo female who presents on 01/10/24 after a ground level fall getting out of bed, secondary to R knee giving out. Pt with R hip and knee pain. R knee effusion, no fracture. 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   OT comments  Pt making progress with functional goals. Tolerating R knee pain with mobility has improved. Pt continues to required CGA/Min A with mobility, transfers using RW during ADLs at sink and toilet, mod A with LB ADLs. Pt with slow, guards pace of movement with min verbal cues for safe hand placement. OT will continue to follow acutely to maximize level of function and safety      If plan is discharge home, recommend the following:  A little help with walking and/or transfers;A lot of help with bathing/dressing/bathroom;Assistance with cooking/housework;Help with stairs or ramp for entrance;Assist for transportation   Equipment Recommendations  BSC/3in1;Other (comment);Tub/shower seat (RW)    Recommendations for Other Services      Precautions / Restrictions Precautions Precautions: Fall;Knee Precaution Booklet Issued: No Recall of Precautions/Restrictions: Impaired Precaution/Restrictions Comments: reviewed use of KI for mobility but the need to come out of it intermittently for knee ROM Required Braces or Orthoses: Knee Immobilizer - Right Knee Immobilizer - Right: Other (comment) Restrictions Weight Bearing Restrictions Per Provider Order: No       Mobility Bed Mobility Overal bed mobility: Needs Assistance Bed Mobility: Supine to Sit     Supine to sit: Min assist     General bed mobility comments: min A to manage RLE to and off L EOB    Transfers Overall transfer level: Needs  assistance Equipment used: Rolling walker (2 wheels) Transfers: Sit to/from Stand, Bed to chair/wheelchair/BSC Sit to Stand: Contact guard assist     Step pivot transfers: Min assist           Balance Overall balance assessment: Needs assistance, History of Falls Sitting-balance support: Feet supported, Single extremity supported Sitting balance-Leahy Scale: Fair     Standing balance support: Bilateral upper extremity supported, During functional activity, Reliant on assistive device for balance Standing balance-Leahy Scale: Poor                             ADL either performed or assessed with clinical judgement   ADL Overall ADL's : Needs assistance/impaired     Grooming: Wash/dry hands;Wash/dry face;Contact guard assist;Standing       Lower Body Bathing: Moderate assistance;Sit to/from stand       Lower Body Dressing: Moderate assistance;Cueing for compensatory techniques;Sitting/lateral leans;Sit to/from stand   Toilet Transfer: Contact guard assist;Minimal assistance;Ambulation;Rolling walker (2 wheels);Regular Toilet;Grab bars   Toileting- Clothing Manipulation and Hygiene: Contact guard assist;Sit to/from stand       Functional mobility during ADLs: Contact guard assist;Minimal assistance;Rolling walker (2 wheels)      Extremity/Trunk Assessment Upper Extremity Assessment Upper Extremity Assessment: Generalized weakness;Overall [facility] for tasks assessed   Lower Extremity Assessment Lower Extremity Assessment: Defer to PT evaluation   Cervical / Trunk Assessment Cervical / Trunk Assessment: Normal    Vision Ability to See in Adequate Light: 0 Adequate Patient Visual Report: No change from baseline     Perception     Praxis  Communication Communication Communication: No apparent difficulties   Cognition Arousal: Alert Behavior During Therapy: WFL for tasks assessed/performed Cognition: No apparent impairments                                Following commands: Intact        Cueing      Exercises      Shoulder Instructions       General Comments      Pertinent Vitals/ Pain       Pain Assessment Pain Assessment: Faces Pain Score: 5  Pain Location: R knee with mobility Pain Descriptors / Indicators: Discomfort, Grimacing, Guarding, Sore Pain Intervention(s): Premedicated before session, Monitored during session, Repositioned, Limited activity within patient's tolerance  Home Living                                          Prior Functioning/Environment              Frequency  Min 2X/week        Progress Toward Goals  OT Goals(current goals can now be found in the care plan section)  Progress towards OT goals: Progressing toward goals     Plan      Co-evaluation                 AM-PAC OT 6 Clicks Daily Activity     Outcome Measure   Help from another person eating meals?: None Help from another person taking care of personal grooming?: A Little Help from another person toileting, which includes using toliet, bedpan, or urinal?: A Little Help from another person bathing (including washing, rinsing, drying)?: A Lot Help from another person to put on and taking off regular upper body clothing?: A Little Help from another person to put on and taking off regular lower body clothing?: A Lot 6 Click Score: 17    End of Session Equipment Utilized During Treatment: Gait belt;Rolling walker (2 wheels)  OT Visit Diagnosis: Unsteadiness on feet (R26.81);Muscle weakness (generalized) (M62.81);History of falling (Z91.81);Pain Pain - Right/Left: Right Pain - part of body: Knee   Activity Tolerance Patient tolerated treatment well   Patient Left in chair;with call bell/phone within reach   Nurse Communication Mobility status        Time: 8982-8953 OT Time Calculation (min): 29 min  Charges: OT General Charges $OT Visit: 1 Visit OT  Treatments $Self Care/Home Management : 8-22 mins $Therapeutic Activity: 8-22 mins    Brittany Greene Loose 01/15/2024, 12:29 PM

## 2024-01-16 DIAGNOSIS — S83206D Unspecified tear of unspecified meniscus, current injury, right knee, subsequent encounter: Secondary | ICD-10-CM | POA: Diagnosis not present

## 2024-01-16 LAB — GLUCOSE, CAPILLARY
Glucose-Capillary: 95 mg/dL (ref 70–99)
Glucose-Capillary: 85 mg/dL (ref 70–99)
Glucose-Capillary: 90 mg/dL (ref 70–99)

## 2024-01-16 MED ORDER — OXYCODONE HCL 5 MG PO TABS
2.5000 mg | ORAL_TABLET | ORAL | Status: DC | PRN
  Administered 2024-01-16 – 2024-01-17 (×4): 2.5 mg via ORAL
  Filled 2024-01-16 (×4): qty 1

## 2024-01-16 MED ORDER — ACETAMINOPHEN 500 MG PO TABS
1000.0000 mg | ORAL_TABLET | Freq: Four times a day (QID) | ORAL | Status: DC
  Administered 2024-01-16 – 2024-01-18 (×8): 1000 mg via ORAL
  Filled 2024-01-16 (×8): qty 2

## 2024-01-16 NOTE — Plan of Care (Signed)
  Problem: Education: Goal: Knowledge of General Education information will improve Description: Including pain rating scale, medication(s)/side effects and non-pharmacologic comfort measures Outcome: Progressing   Problem: Health Behavior/Discharge Planning: Goal: Ability to manage health-related needs will improve Outcome: Progressing   Problem: Clinical Measurements: Goal: Ability to maintain clinical measurements within normal limits will improve Outcome: Progressing Goal: Diagnostic test results will improve Outcome: Progressing Goal: Respiratory complications will improve Outcome: Progressing Goal: Cardiovascular complication will be avoided Outcome: Progressing   Problem: Activity: Goal: Risk for activity intolerance will decrease Outcome: Progressing   Problem: Nutrition: Goal: Adequate nutrition will be maintained Outcome: Progressing   Problem: Coping: Goal: Level of anxiety will decrease Outcome: Progressing   Problem: Elimination: Goal: Will not experience complications related to bowel motility Outcome: Progressing Goal: Will not experience complications related to urinary retention Outcome: Progressing   Problem: Pain Managment: Goal: General experience of comfort will improve and/or be controlled Outcome: Progressing   Problem: Safety: Goal: Ability to remain free from injury will improve Outcome: Progressing   Problem: Skin Integrity: Goal: Risk for impaired skin integrity will decrease Outcome: Progressing   Problem: Education: Goal: Ability to describe self-care measures that may prevent or decrease complications (Diabetes Survival Skills Education) will improve Outcome: Progressing Goal: Individualized Educational Video(s) Outcome: Progressing   Problem: Coping: Goal: Ability to adjust to condition or change in health will improve Outcome: Progressing   Problem: Fluid Volume: Goal: Ability to maintain a balanced intake and output will  improve Outcome: Progressing   Problem: Health Behavior/Discharge Planning: Goal: Ability to identify and utilize available resources and services will improve Outcome: Progressing Goal: Ability to manage health-related needs will improve Outcome: Progressing   Problem: Metabolic: Goal: Ability to maintain appropriate glucose levels will improve Outcome: Progressing   Problem: Nutritional: Goal: Maintenance of adequate nutrition will improve Outcome: Progressing Goal: Progress toward achieving an optimal weight will improve Outcome: Progressing   Problem: Skin Integrity: Goal: Risk for impaired skin integrity will decrease Outcome: Progressing   Problem: Tissue Perfusion: Goal: Adequacy of tissue perfusion will improve Outcome: Progressing

## 2024-01-16 NOTE — Progress Notes (Signed)
     Daily Progress Note Intern Pager: [phone]  Patient name: Brittany Greene Medical record number: [id number] Date of birth: [birth year] Age: 83 y.o. Gender: female  Primary Care Provider: Cristopher Suzen HERO, NP Consultants:  ortho (s/o)  Code Status: Full  Pt Overview and Major Events to Date:  8/17: admitted 8/20: knee aspiration  Medical Decision Making:  [place] 83 year old female who presented with acute right knee pain after fall confirmed to have acute meniscal tear and inability to ambulate.  Admitted for pain control, medically stable awaiting discharge to SNF. Pertinent PMH/PSH includes CHF, type 2 diabetes, hypertension.  Assessment & Plan Knee pain Fall Inability to bear weight Tear of meniscus of right knee as current injury Pain improving.  - Pain management with 1000mg  Tylenol  q6h; 2.5mg  oxycodone  q4h prn, miralax  scheduled daily for bowel regimen - PT/OT  - Encourage mobility Chronic health problem Chronic combined systolic and diastolic CHF: continue home furosemide  10mg  daily, home carvedilol  6.25mg  BID, home Jardiance  10mg  daily, and Entresto  24-26mg  BID HLD: continue home rosuvastatin  20mg   Anemia: stable, Hgb 8.6 Type 2 diabetes: CBG checks with meals and at night, continue home Jardiance , sensitive sliding scale   FEN/GI: Regular PPx: Lovenox  Dispo: Awaiting SNF placement  Subjective:  NAEO. Reports pain in knee has improved somewhat.   Objective: Temp:  [97.8 F (36.6 C)-99.6 F (37.6 C)] 98.2 F (36.8 C) (08/22 1947) Pulse Rate:  [57-81] 64 (08/22 1947) Resp:  [17-18] 17 (08/22 1947) BP: (119-153)/(58-66) 128/62 (08/22 1947) SpO2:  [100 %] 100 % (08/22 1947) Physical Exam: General: NAD, well appearing, lying in hospital bed Neuro: A&O Cardiovascular: RRR, no murmurs,  Respiratory: normal WOB on RA, CTAB, no wheezes, ronchi or rales Abdomen: soft, NTTP, no rebound or guarding Extremities: Moving all 4 extremities equally, no peripheral  edema, right knee diffusely tender to palpation, mild swelling present   Laboratory: Most recent CBC Lab Results  Component Value Date   WBC 6.4 01/11/2024   HGB 8.6 (L) 01/11/2024   HCT 27.5 (L) 01/11/2024   MCV 79.5 (L) 01/11/2024   PLT 187 01/11/2024   Most recent BMP    Latest Ref Rng & Units 01/10/2024   11:13 AM  BMP  Glucose 70 - 99 mg/dL 873   BUN 8 - 23 mg/dL 24   Creatinine 9.55 - 1.00 mg/dL 8.47   Sodium 864 - 854 mmol/L 138   Potassium 3.5 - 5.1 mmol/L 3.6   Chloride 98 - 111 mmol/L 108   CO2 22 - 32 mmol/L 21   Calcium  8.9 - 10.3 mg/dL 8.3     Imaging/Diagnostic Tests: No new imaging.  Alba Sharper, MD 01/16/2024, 12:33 AM  PGY-3, [facility] FPTS Intern pager: [phone], text pages welcome Secure chat group [facility] Teaching Service

## 2024-01-16 NOTE — Assessment & Plan Note (Addendum)
 Pain improving.  - Pain management with 1000mg  Tylenol  q6h; 2.5mg  oxycodone  q4h prn, miralax  scheduled daily for bowel regimen - PT/OT  - Encourage mobility

## 2024-01-16 NOTE — Assessment & Plan Note (Signed)
 Chronic combined systolic and diastolic CHF: continue home furosemide  10mg  daily, home carvedilol  6.25mg  BID, home Jardiance  10mg  daily, and Entresto  24-26mg  BID HLD: continue home rosuvastatin  20mg   Anemia: stable, Hgb 8.6 Type 2 diabetes: CBG checks with meals and at night, continue home Jardiance , sensitive sliding scale

## 2024-01-17 DIAGNOSIS — S83262D Peripheral tear of lateral meniscus, current injury, left knee, subsequent encounter: Secondary | ICD-10-CM

## 2024-01-17 DIAGNOSIS — S83269A Peripheral tear of lateral meniscus, current injury, unspecified knee, initial encounter: Secondary | ICD-10-CM

## 2024-01-17 LAB — GLUCOSE, CAPILLARY
Glucose-Capillary: 115 mg/dL — ABNORMAL HIGH (ref 70–99)
Glucose-Capillary: 118 mg/dL — ABNORMAL HIGH (ref 70–99)
Glucose-Capillary: 163 mg/dL — ABNORMAL HIGH (ref 70–99)
Glucose-Capillary: 133 mg/dL — ABNORMAL HIGH (ref 70–99)

## 2024-01-17 NOTE — Assessment & Plan Note (Addendum)
 Chronic combined systolic and diastolic CHF: continue home furosemide  10mg  daily, home carvedilol  6.25mg  BID, home Jardiance  10mg  daily, and Entresto  24-26mg  BID HLD: continue home rosuvastatin  20mg   Anemia: stable, Hgb 8.6 Type 2 diabetes: stable, CBG checks with meals and at night, continue home Jardiance , sensitive sliding scale

## 2024-01-17 NOTE — Plan of Care (Signed)
  Problem: Education: Goal: Knowledge of General Education information will improve Description: Including pain rating scale, medication(s)/side effects and non-pharmacologic comfort measures Outcome: Progressing   Problem: Health Behavior/Discharge Planning: Goal: Ability to manage health-related needs will improve Outcome: Progressing   Problem: Clinical Measurements: Goal: Ability to maintain clinical measurements within normal limits will improve Outcome: Progressing Goal: Diagnostic test results will improve Outcome: Progressing Goal: Respiratory complications will improve Outcome: Progressing Goal: Cardiovascular complication will be avoided Outcome: Progressing   Problem: Activity: Goal: Risk for activity intolerance will decrease Outcome: Progressing   Problem: Nutrition: Goal: Adequate nutrition will be maintained Outcome: Progressing   Problem: Coping: Goal: Level of anxiety will decrease Outcome: Progressing   Problem: Elimination: Goal: Will not experience complications related to bowel motility Outcome: Progressing Goal: Will not experience complications related to urinary retention Outcome: Progressing   Problem: Pain Managment: Goal: General experience of comfort will improve and/or be controlled Outcome: Progressing   Problem: Safety: Goal: Ability to remain free from injury will improve Outcome: Progressing   Problem: Skin Integrity: Goal: Risk for impaired skin integrity will decrease Outcome: Progressing   Problem: Education: Goal: Ability to describe self-care measures that may prevent or decrease complications (Diabetes Survival Skills Education) will improve Outcome: Progressing Goal: Individualized Educational Video(s) Outcome: Progressing   Problem: Coping: Goal: Ability to adjust to condition or change in health will improve Outcome: Progressing   Problem: Fluid Volume: Goal: Ability to maintain a balanced intake and output will  improve Outcome: Progressing   Problem: Health Behavior/Discharge Planning: Goal: Ability to identify and utilize available resources and services will improve Outcome: Progressing Goal: Ability to manage health-related needs will improve Outcome: Progressing   Problem: Metabolic: Goal: Ability to maintain appropriate glucose levels will improve Outcome: Progressing   Problem: Nutritional: Goal: Maintenance of adequate nutrition will improve Outcome: Progressing Goal: Progress toward achieving an optimal weight will improve Outcome: Progressing   Problem: Skin Integrity: Goal: Risk for impaired skin integrity will decrease Outcome: Progressing   Problem: Tissue Perfusion: Goal: Adequacy of tissue perfusion will improve Outcome: Progressing

## 2024-01-17 NOTE — TOC Progression Note (Signed)
 Transition of Care [facility]) - Progression Note    Patient Details  Name: Brittany Greene MRN: [id number] Date of Birth: [birth year]  Transition of Care [facility]) CM/SW Contact  Dino CHRISTELLA Au, LCSWA Phone Number: 01/17/2024, 10:23 AM  Clinical Narrative:     SW left with [facility] [phone])   Expected Discharge Plan:  (TBD) Barriers to Discharge: Other (must enter comment), SNF Pending bed offer (will not have required inpt stay for DC to SNF)               Expected Discharge Plan and Services In-house Referral: Clinical Social Work   Post Acute Care Choice:  (TBD) Living arrangements for the past 2 months: Single Family Home                                       Social Drivers of Health (SDOH) Interventions SDOH Screenings   Food Insecurity: No Food Insecurity (01/11/2024)  Housing: Low Risk  (01/11/2024)  Transportation Needs: No Transportation Needs (01/11/2024)  Utilities: Not At Risk (01/11/2024)  Social Connections: Moderately Integrated (01/11/2024)  Tobacco Use: Medium Risk (01/10/2024)    Readmission Risk Interventions     No data to display

## 2024-01-17 NOTE — Progress Notes (Signed)
 Mobility Specialist Progress Note:    01/17/24 1359  Mobility  Activity Ambulated with assistance  Level of Assistance Contact guard assist, steadying assist  Assistive Device Front wheel walker  Distance Ambulated (ft) 30 ft  Activity Response Tolerated well  Mobility Referral Yes  Mobility visit 1 Mobility  Mobility Specialist Start Time (ACUTE ONLY) 1330  Mobility Specialist Stop Time (ACUTE ONLY) 1346  Mobility Specialist Time Calculation (min) (ACUTE ONLY) 16 min   Received pt in bed having no complaints and agreeable to mobility. Pt was asymptomatic throughout ambulation and returned to room w/o fault. Left in chair w/ call bell in reach and all needs met.   Thersia Minder Mobility Specialist  Please contact vis Secure Chat or  Rehab Office [phone]

## 2024-01-17 NOTE — Assessment & Plan Note (Addendum)
 Pain improving, ambulation progressing - Pain management with 1000mg  Tylenol  q6h; 2.5mg  oxycodone  q4h prn, miralax  scheduled daily for bowel regimen - PT/OT  - Encourage mobility

## 2024-01-17 NOTE — Progress Notes (Signed)
 Mobility Specialist Progress Note: Late Entry    01/16/24 1206  Mobility  Activity Ambulated with assistance  Level of Assistance Contact guard assist, steadying assist  Assistive Device Front wheel walker  Distance Ambulated (ft) 25 ft  Activity Response Tolerated well  Mobility Referral Yes  Mobility visit 1 Mobility  Mobility Specialist Start Time (ACUTE ONLY) 1120  Mobility Specialist Stop Time (ACUTE ONLY) 1134  Mobility Specialist Time Calculation (min) (ACUTE ONLY) 14 min   Pt received in bed agreeable to mobility. No physical assistance required. Distance limited d/t fatigue, otherwise tolerated well. Returned to chair w/o fault. Left in chair w/ call bell and personal belongings in reach. All needs met.   Thersia Minder Mobility Specialist  Please contact vis Secure Chat or  Rehab Office [phone]

## 2024-01-17 NOTE — Progress Notes (Signed)
     Daily Progress Note Intern Pager: [phone]  Patient name: Brittany Greene Medical record number: [id number] Date of birth: 1940/12/01 Age: 83 y.o. Gender: female  Primary Care Provider: Cristopher Suzen HERO, NP Consultants: ortho (s/o) Code Status: Full  Pt Overview and Major Events to Date:  8/17: admitted 8/20: knee aspiration  Assessment and Plan: Brittany Greene is a 83 year old female who presented with acute right knee pain after fall confirmed to have acute meniscal tear and inability to ambulate.  Admitted for pain control, medically stable awaiting discharge to SNF. Pertinent PMH/PSH includes CHF, type 2 diabetes, hypertension.  Assessment & Plan Knee pain Fall Inability to bear weight Tear of meniscus of right knee as current injury Pain improving, ambulation progressing - Pain management with 1000mg  Tylenol  q6h; 2.5mg  oxycodone  q4h prn, miralax  scheduled daily for bowel regimen - PT/OT  - Encourage mobility Chronic health problem Chronic combined systolic and diastolic CHF: continue home furosemide  10mg  daily, home carvedilol  6.25mg  BID, home Jardiance  10mg  daily, and Entresto  24-26mg  BID HLD: continue home rosuvastatin  20mg   Anemia: stable, Hgb 8.6 Type 2 diabetes: stable, CBG checks with meals and at night, continue home Jardiance , sensitive sliding scale  FEN/GI: Regular PPx: Lovenox  Dispo: Awaiting SNF placement  Subjective:  Patient is doing well today.  She was able to ambulate farther (down the hall) and with less pain during yesterday's PT/OT session. She has no other concerns at this time.  Objective: Temp:  [97.8 F (36.6 C)-98.2 F (36.8 C)] 97.8 F (36.6 C) (08/24 0425) Pulse Rate:  [65-74] 65 (08/24 0425) Resp:  [17-18] 18 (08/24 0425) BP: (103-144)/(56-67) 138/60 (08/24 0425) SpO2:  [96 %-100 %] 100 % (08/24 0425) Physical Exam: General: Resting in bed, NAD Cardiovascular: RRR, no m/r/g Respiratory: CTAB, normal work of breathing on room air Abdomen:  Soft, nontender, nondistended Extremities: Minimal swelling of right knee, mild pain with both active and passive ROM R knee  Laboratory: Most recent CBC Lab Results  Component Value Date   WBC 6.4 01/11/2024   HGB 8.6 (L) 01/11/2024   HCT 27.5 (L) 01/11/2024   MCV 79.5 (L) 01/11/2024   PLT 187 01/11/2024   Most recent BMP    Latest Ref Rng & Units 01/10/2024   11:13 AM  BMP  Glucose 70 - 99 mg/dL 873   BUN 8 - 23 mg/dL 24   Creatinine 9.55 - 1.00 mg/dL 8.47   Sodium 864 - 854 mmol/L 138   Potassium 3.5 - 5.1 mmol/L 3.6   Chloride 98 - 111 mmol/L 108   CO2 22 - 32 mmol/L 21   Calcium  8.9 - 10.3 mg/dL 8.3    Brittany Nakai, DO 01/17/2024, 7:45 AM  PGY-1, [facility] FPTS Intern pager: [phone], text pages welcome Secure chat group [facility] Teaching Service

## 2024-01-18 ENCOUNTER — Ambulatory Visit

## 2024-01-18 LAB — GLUCOSE, CAPILLARY
Glucose-Capillary: 111 mg/dL — ABNORMAL HIGH (ref 70–99)
Glucose-Capillary: 134 mg/dL — ABNORMAL HIGH (ref 70–99)

## 2024-01-18 MED ORDER — OXYCODONE HCL 5 MG PO TABS: 2.5000 mg | ORAL_TABLET | ORAL | 0 refills | Status: DC | PRN

## 2024-01-18 MED ORDER — OXYCODONE HCL 5 MG PO TABS: 2.5000 mg | ORAL_TABLET | ORAL | Status: DC | PRN

## 2024-01-18 MED ORDER — OXYCODONE HCL 5 MG PO TABS: 2.5000 mg | ORAL_TABLET | ORAL | 0 refills | Status: AC | PRN

## 2024-01-18 NOTE — Assessment & Plan Note (Addendum)
 Pain improving, now currently only present with weightbearing, ambulation progressing - Pain management with 1000mg  Tylenol  q6h; 2.5mg  oxycodone  q4h prn, miralax  scheduled daily for bowel regimen - PT/OT  - Encourage mobility

## 2024-01-18 NOTE — Progress Notes (Signed)
 Physical Therapy Treatment Patient Details Name: Brittany Greene MRN: [id number] DOB: [birth year] Today's Date: 01/18/2024   History of Present Illness Pt is 83 yo female who presents on 01/10/24 after a ground level fall getting out of bed, secondary to R knee giving out. Pt with R hip and knee pain. R knee effusion, no fracture. 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    PT Comments  Pt resting in bed on arrival, pleasant and agreeable to session and demonstrating good progress towards acute goals. Pt demonstrating increased activity tolerance this session, progressing gait distance with grossly CGA for safety with RW for support. Pt continues to demonstrate slow antalgic step-to pattern, with R knee flexed in stance, however no buckling noted and pt without overt LOB. Pt performing seated RLE exercises with fair tolerance. Pt continues to benefit from skilled PT services to progress toward functional mobility goals.      If plan is discharge home, recommend the following: A lot of help with walking and/or transfers;A little help with bathing/dressing/bathroom;Assistance with cooking/housework;Assist for transportation   Can travel by private vehicle     No  Equipment Recommendations  Rolling walker (2 wheels);BSC/3in1    Recommendations for Other Services       Precautions / Restrictions Precautions Precautions: Fall;Knee Precaution Booklet Issued: No Recall of Precautions/Restrictions: Impaired Restrictions Weight Bearing Restrictions Per Provider Order: No     Mobility  Bed Mobility Overal bed mobility: Needs Assistance Bed Mobility: Supine to Sit     Supine to sit: Contact guard     General bed mobility comments: CGA for safety with increased time    Transfers Overall transfer level: Needs assistance Equipment used: Rolling walker (2 wheels) Transfers: Sit  to/from Stand, Bed to chair/wheelchair/BSC Sit to Stand: Contact guard assist, Min assist           General transfer comment: CGA from EOB at lowest height, min A to stand from low commode    Ambulation/Gait Ambulation/Gait assistance: Contact guard assist Gait Distance (Feet): 15 Feet (+ 60') Assistive device: Rolling walker (2 wheels) Gait Pattern/deviations: Step-to pattern, Decreased stance time - right, Decreased weight shift to right Gait velocity: decreased     General Gait Details: grossly CGA for safety, slow antalgic step-to pattern, no overt LOB noted   Stairs             Wheelchair Mobility     Tilt Bed    Modified Rankin (Stroke Patients Only)       Balance Overall balance assessment: Needs assistance, History of Falls Sitting-balance support: Feet supported, Single extremity supported Sitting balance-Leahy Scale: Fair     Standing balance support: Bilateral upper extremity supported, During functional activity, Reliant on assistive device for balance Standing balance-Leahy Scale: Poor Standing balance comment: heavy reliance on RW and external support                            Communication Communication Communication: No apparent difficulties  Cognition Arousal: Alert Behavior During Therapy: WFL for tasks assessed/performed   PT - Cognitive impairments: Memory                       PT - Cognition Comments: mild STM deficits noted with pt needing cues for sequencing Following commands: Intact      Cueing Cueing Techniques:  Verbal cues  Exercises General Exercises - Lower Extremity Quad Sets: AROM, Right, Seated, 10 reps Long Arc Quad: AROM, Right, 10 reps, Seated    General Comments        Pertinent Vitals/Pain Pain Assessment Pain Assessment: Faces Faces Pain Scale: Hurts a little bit Pain Location: R knee with mobility Pain Descriptors / Indicators: Discomfort, Grimacing, Guarding, Sore Pain  Intervention(s): Monitored during session, Limited activity within patient's tolerance, Repositioned    Home Living                          Prior Function            PT Goals (current goals can now be found in the care plan section) Acute Rehab PT Goals Patient Stated Goal: figure out what is wrong with knee, what made it buckle and why is still hurts so badly PT Goal Formulation: With patient/family Time For Goal Achievement: 01/25/24 Progress towards PT goals: Progressing toward goals    Frequency    Min 2X/week      PT Plan      Co-evaluation              AM-PAC PT 6 Clicks Mobility   Outcome Measure  Help needed turning from your back to your side while in a flat bed without using bedrails?: A Lot Help needed moving from lying on your back to sitting on the side of a flat bed without using bedrails?: A Lot Help needed moving to and from a bed to a chair (including a wheelchair)?: A Little Help needed standing up from a chair using your arms (e.g., wheelchair or bedside chair)?: A Little Help needed to walk in hospital room?: A Little Help needed climbing 3-5 steps with a railing? : A Lot 6 Click Score: 15    End of Session Equipment Utilized During Treatment: Gait belt Activity Tolerance: Patient tolerated treatment well Patient left: with call bell/phone within reach;in chair Nurse Communication: Mobility status PT Visit Diagnosis: Pain;Difficulty in walking, not elsewhere classified (R26.2) Pain - Right/Left: Right Pain - part of body: Knee     Time: 8987-8964 PT Time Calculation (min) (ACUTE ONLY): 23 min  Charges:    $Gait Training: 23-37 mins PT General Charges $$ ACUTE PT VISIT: 1 Visit                     Therisa R. PTA Acute Rehabilitation Services Office: [phone]   Therisa CHRISTELLA Boor 01/18/2024, 12:35 PM

## 2024-01-18 NOTE — Plan of Care (Signed)
  Problem: Education: Goal: Knowledge of General Education information will improve Description: Including pain rating scale, medication(s)/side effects and non-pharmacologic comfort measures Outcome: Progressing   Problem: Health Behavior/Discharge Planning: Goal: Ability to manage health-related needs will improve Outcome: Progressing   Problem: Clinical Measurements: Goal: Ability to maintain clinical measurements within normal limits will improve Outcome: Progressing Goal: Diagnostic test results will improve Outcome: Progressing Goal: Respiratory complications will improve Outcome: Progressing Goal: Cardiovascular complication will be avoided Outcome: Progressing   Problem: Activity: Goal: Risk for activity intolerance will decrease Outcome: Progressing   Problem: Nutrition: Goal: Adequate nutrition will be maintained Outcome: Progressing   Problem: Coping: Goal: Level of anxiety will decrease Outcome: Progressing   Problem: Elimination: Goal: Will not experience complications related to bowel motility Outcome: Progressing Goal: Will not experience complications related to urinary retention Outcome: Progressing   Problem: Pain Managment: Goal: General experience of comfort will improve and/or be controlled Outcome: Progressing   Problem: Safety: Goal: Ability to remain free from injury will improve Outcome: Progressing   Problem: Skin Integrity: Goal: Risk for impaired skin integrity will decrease Outcome: Progressing   Problem: Education: Goal: Ability to describe self-care measures that may prevent or decrease complications (Diabetes Survival Skills Education) will improve Outcome: Progressing Goal: Individualized Educational Video(s) Outcome: Progressing   Problem: Coping: Goal: Ability to adjust to condition or change in health will improve Outcome: Progressing   Problem: Fluid Volume: Goal: Ability to maintain a balanced intake and output will  improve Outcome: Progressing   Problem: Health Behavior/Discharge Planning: Goal: Ability to identify and utilize available resources and services will improve Outcome: Progressing Goal: Ability to manage health-related needs will improve Outcome: Progressing   Problem: Metabolic: Goal: Ability to maintain appropriate glucose levels will improve Outcome: Progressing   Problem: Nutritional: Goal: Maintenance of adequate nutrition will improve Outcome: Progressing Goal: Progress toward achieving an optimal weight will improve Outcome: Progressing   Problem: Skin Integrity: Goal: Risk for impaired skin integrity will decrease Outcome: Progressing   Problem: Tissue Perfusion: Goal: Adequacy of tissue perfusion will improve Outcome: Progressing

## 2024-01-18 NOTE — Progress Notes (Signed)
     Daily Progress Note Intern Pager: [phone]  Patient name: Brittany Greene Medical record number: [id number] Date of birth: 10-05-1940 Age: 83 y.o. Gender: female  Primary Care Provider: Cristopher Suzen HERO, NP Consultants: ortho (s/o) Code Status: full  Pt Overview and Major Events to Date:  8/17: admitted 8/20: knee aspiration  Assessment and Plan: Myrtha is a 83 year old female who presented with acute right knee pain after fall confirmed to have acute meniscal tear and inability to ambulate.  She was admitted for pain control, and is now medically stable awaiting discharge to SNF. Pertinent PMH/PSH includes CHF, T2DM, HTN. Assessment & Plan Knee pain Fall Inability to bear weight Tear of meniscus of right knee as current injury Acute lateral meniscal tear with peripheral detachment Pain improving, now currently only present with weightbearing, ambulation progressing - Pain management with 1000mg  Tylenol  q6h; 2.5mg  oxycodone  q4h prn, miralax  scheduled daily for bowel regimen - PT/OT  - Encourage mobility Chronic health problem Chronic combined systolic and diastolic CHF: continue home furosemide  10mg  daily, home carvedilol  6.25mg  BID, home Jardiance  10mg  daily, and Entresto  24-26mg  BID HLD: continue home rosuvastatin  20mg   Anemia: stable, Hgb 8.6 Type 2 diabetes: stable, CBG checks with meals and at night, continue home Jardiance , sensitive sliding scale  FEN/GI: regular PPx: lovenox  Dispo:SNF today pending placement  Subjective:  Patient says she is doing much better and making a lot of improvement during her physical therapy sessions.  She says she no longer feels tenderness around her patella, however does feel pain with weightbearing which still makes ambulation difficult.  We discussed sending her to SNF with oral pain medication to help her with rehabilitation and she thinks this is a good idea.  Patient says she thinks the knee aspiration and injection really helped her  pain.  Objective: Temp:  [98.2 F (36.8 C)-98.5 F (36.9 C)] 98.4 F (36.9 C) (08/25 0408) Pulse Rate:  [67-88] 67 (08/25 0408) Resp:  [17-18] 17 (08/25 0408) BP: (107-135)/(58-69) 112/60 (08/25 0408) SpO2:  [99 %-100 %] 100 % (08/25 0408) Physical Exam: General: Comfortable, laying on her side in hospital bed Cardiovascular: RRR, no m/r/g Respiratory: CTAB, normal work of breathing on room air Abdomen: Soft, nontender, nondistended Extremities: NTTP, denies pain with active or passive range of motion testing, able to fully extend right knee, however has difficulty flexing past 90 degrees.  Laboratory: Most recent CBC Lab Results  Component Value Date   WBC 6.4 01/11/2024   HGB 8.6 (L) 01/11/2024   HCT 27.5 (L) 01/11/2024   MCV 79.5 (L) 01/11/2024   PLT 187 01/11/2024   Most recent BMP    Latest Ref Rng & Units 01/10/2024   11:13 AM  BMP  Glucose 70 - 99 mg/dL 873   BUN 8 - 23 mg/dL 24   Creatinine 9.55 - 1.00 mg/dL 8.47   Sodium 864 - 854 mmol/L 138   Potassium 3.5 - 5.1 mmol/L 3.6   Chloride 98 - 111 mmol/L 108   CO2 22 - 32 mmol/L 21   Calcium  8.9 - 10.3 mg/dL 8.3    Idelle Nakai, DO 01/18/2024, 7:17 AM  PGY-1, [facility] FPTS Intern pager: [phone], text pages welcome Secure chat group [facility] Teaching Service

## 2024-01-18 NOTE — TOC Transition Note (Signed)
 Transition of Care [facility]) - Discharge Note   Patient Details  Name: Brittany Greene MRN: [id number] Date of Birth: 1940-10-04  Transition of Care [facility]) CM/SW Contact:  Bridget Cordella Simmonds, LCSW Phone Number: 01/18/2024, 10:51 AM   Clinical Narrative:   Pt discharging to [facility]  RN call report to [phone].  PTAR called [phone]    Final next level of care: Skilled Nursing Facility Barriers to Discharge: Barriers Resolved   Patient Goals and CMS Choice Patient states their goals for this hospitalization and ongoing recovery are:: be like I was          Discharge Placement              Patient chooses bed at: [facility] Patient to be transferred to facility by: ptar Name of family member notified: [name] Patient and family notified of of transfer: 01/18/24  Discharge Plan and Services Additional resources added to the After Visit Summary for   In-house Referral: Clinical Social Work   Post Acute Care Choice:  (TBD)                               Social Drivers of Health (SDOH) Interventions SDOH Screenings   Food Insecurity: No Food Insecurity (01/11/2024)  Housing: Low Risk  (01/11/2024)  Transportation Needs: No Transportation Needs (01/11/2024)  Utilities: Not At Risk (01/11/2024)  Social Connections: Moderately Integrated (01/11/2024)  Tobacco Use: Medium Risk (01/10/2024)     Readmission Risk Interventions     No data to display

## 2024-01-18 NOTE — TOC Progression Note (Signed)
 Transition of Care [facility]) - Progression Note    Patient Details  Name: Jestine Bicknell MRN: [id number] Date of Birth: 11/18/1940  Transition of Care [facility]) CM/SW Contact  Bridget Cordella Simmonds, LCSW Phone Number: 01/18/2024, 8:25 AM  Clinical Narrative:   CSW confirmed with [facility] that they can receive pt today.  MD aware.     Expected Discharge Plan:  (TBD) Barriers to Discharge: Other (must enter comment), SNF Pending bed offer (will not have required inpt stay for DC to SNF)               Expected Discharge Plan and Services In-house Referral: Clinical Social Work   Post Acute Care Choice:  (TBD) Living arrangements for the past 2 months: Single Family Home                                       Social Drivers of Health (SDOH) Interventions SDOH Screenings   Food Insecurity: No Food Insecurity (01/11/2024)  Housing: Low Risk  (01/11/2024)  Transportation Needs: No Transportation Needs (01/11/2024)  Utilities: Not At Risk (01/11/2024)  Social Connections: Moderately Integrated (01/11/2024)  Tobacco Use: Medium Risk (01/10/2024)    Readmission Risk Interventions     No data to display

## 2024-01-18 NOTE — Assessment & Plan Note (Signed)
 Chronic combined systolic and diastolic CHF: continue home furosemide  10mg  daily, home carvedilol  6.25mg  BID, home Jardiance  10mg  daily, and Entresto  24-26mg  BID HLD: continue home rosuvastatin  20mg   Anemia: stable, Hgb 8.6 Type 2 diabetes: stable, CBG checks with meals and at night, continue home Jardiance , sensitive sliding scale

## 2024-02-17 ENCOUNTER — Other Ambulatory Visit: Payer: Self-pay

## 2024-02-17 MED ORDER — ROSUVASTATIN CALCIUM 20 MG PO TABS: 20.0000 mg | ORAL_TABLET | Freq: Every day | ORAL | 1 refills | Status: AC

## 2024-03-03 ENCOUNTER — Other Ambulatory Visit: Payer: Self-pay | Admitting: Internal Medicine

## 2024-04-02 ENCOUNTER — Other Ambulatory Visit: Payer: Self-pay | Admitting: Internal Medicine

## 2024-04-04 LAB — COLOGUARD: COLOGUARD: NEGATIVE

## 2024-04-17 ENCOUNTER — Other Ambulatory Visit: Payer: Self-pay | Admitting: Internal Medicine

## 2024-04-22 ENCOUNTER — Other Ambulatory Visit: Payer: Self-pay | Admitting: Internal Medicine

## 2024-05-09 ENCOUNTER — Telehealth: Payer: Self-pay | Admitting: Internal Medicine

## 2024-05-09 MED ORDER — SACUBITRIL-VALSARTAN 24-26 MG PO TABS: 1.0000 | ORAL_TABLET | Freq: Two times a day (BID) | ORAL | 0 refills | Status: AC

## 2024-05-09 NOTE — Telephone Encounter (Signed)
*  STAT* If patient is at the pharmacy, call can be transferred to refill team.     1. Which medications need to be refilled? (please list name of each medication and dose if known)   sacubitril -valsartan  (ENTRESTO ) 24-26 MG   empagliflozin  (JARDIANCE ) 10 MG TABS tablet     2. Would you like to learn more about the convenience, safety, & potential cost savings by using the [facility]?  No     3. Are you open to using the [facility] (Type [facility].)     4. Which pharmacy/location (including street and city if local pharmacy) is medication to be sent to? [id number] - , [place] - [address] AT [address]       5. Do they need a 30 day or 90 day supply? Refill until appt 06/15/2024

## 2024-05-09 NOTE — Telephone Encounter (Signed)
 Refill for Entresto  [facility] to [facility]  However, I don't see Dr. Santo has ever filled Jardiance , and it looks like it may be for diabetes?  Will forward to Dr. Santo and his RN to advise.

## 2024-05-09 NOTE — Telephone Encounter (Signed)
 Pt has been made aware that her Entresto  has been sent to her pharmacy, however, she will need to get the Jardiance  from her PCP, as it was started for diabetes.  Pt verbalized understanding.

## 2024-06-15 ENCOUNTER — Encounter: Payer: Self-pay | Admitting: Physician Assistant

## 2024-06-15 ENCOUNTER — Ambulatory Visit: Attending: Cardiology | Admitting: Physician Assistant

## 2024-06-15 VITALS — BP 130/72 | HR 83 | Ht 64.0 in | Wt 133.4 lb

## 2024-06-15 DIAGNOSIS — I5042 Chronic combined systolic (congestive) and diastolic (congestive) heart failure: Secondary | ICD-10-CM

## 2024-06-15 DIAGNOSIS — I4891 Unspecified atrial fibrillation: Secondary | ICD-10-CM | POA: Diagnosis present

## 2024-06-15 DIAGNOSIS — Z9889 Other specified postprocedural states: Secondary | ICD-10-CM | POA: Insufficient documentation

## 2024-06-15 DIAGNOSIS — Z8679 Personal history of other diseases of the circulatory system: Secondary | ICD-10-CM | POA: Insufficient documentation

## 2024-06-15 DIAGNOSIS — I428 Other cardiomyopathies: Secondary | ICD-10-CM | POA: Insufficient documentation

## 2024-06-15 DIAGNOSIS — E785 Hyperlipidemia, unspecified: Secondary | ICD-10-CM | POA: Insufficient documentation

## 2024-06-15 DIAGNOSIS — I340 Nonrheumatic mitral (valve) insufficiency: Secondary | ICD-10-CM | POA: Diagnosis present

## 2024-06-15 DIAGNOSIS — I723 Aneurysm of iliac artery: Secondary | ICD-10-CM

## 2024-06-15 DIAGNOSIS — I420 Dilated cardiomyopathy: Secondary | ICD-10-CM

## 2024-06-15 DIAGNOSIS — I10 Essential (primary) hypertension: Secondary | ICD-10-CM | POA: Insufficient documentation

## 2024-06-15 MED ORDER — APIXABAN 2.5 MG PO TABS: 2.5000 mg | ORAL_TABLET | Freq: Two times a day (BID) | ORAL | 11 refills | Status: AC

## 2024-06-15 NOTE — Progress Notes (Unsigned)
 " Cardiology Office Note   Date:  06/16/2024  ID:  Abrish Erny, DOB 04-05-1941, MRN [id number] PCP: Cristopher Suzen HERO, NP  [facility] Providers Cardiologist:  Stanly DELENA Leavens, MD     History of Present Illness Brittany Greene is a 84 y.o. female with a history of HFrEF/NICM, moderate MR, mild to moderate AI, PAD s/p left carotid-subclavian bypass surgery and TEVAR at [facility], prior celiac artery stenosis, hypertension, hyperlipidemia, DM2, and CKD stage III.  History of breast cancer was unclear chemotherapy but no radiation.  Patient was admitted for heart failure in June 2023 and was started on GDMT.  Cardiac catheterization performed on 11/12/2021 showed 20% ostial to proximal RCA lesion, otherwise tortuous but relatively normal coronary arteries with no significant disease, cardiac output 4.23, cardiac index 2.49, wedge pressure 14 mmHg.  Lasix  was later changed to as needed and MRA stopped it due to AKI.  Repeat echocardiogram obtained on 04/29/2022 showed EF 40 to 45%, mild MR, moderate AI, dilated aortic root measuring at 41 mm.  She was last seen by Dr. Arnetha in August 2024 at which time she was doing well.  Repeat echocardiogram obtained on 05/04/2023 showed EF 45 to 50%, mild concentric LVH, grade 2 DD, mildly reduced RV systolic function, RVSP 21.1 mmHg, moderate LAE, trivial MR, moderate to severe AI, mild dilatation the aortic root measuring 40 mm.  Patient was last admitted in August 2025 with a fall.  CT of the right knee at the time showed moderate osteoarthritis in the moderate joint effusion.  She required joint aspiration, injection and pain control.  Patient presents today for follow-up.  She says she did have a echocardiogram obtained at [facility] in April 2025, I am unable to see the result under CareEverywhere.  She has upcoming visit with [facility] around April of this year as well.  She denies any significant chest pain or shortness of breath.  She has no lower extremity edema,  orthopnea or PND.  She denies any dizziness.  EKG today shows new atrial fibrillation with well-controlled heart rate.  This is further confirmed on rhythm strip as well.  I reviewed the EKG with DOD Dr. Kate who is agreeable with the diagnosis of new A-fib.  I will start the patient on 2.5 mg twice a day of Eliquis .  She should be on low-dose Eliquis  given age, weight and creatinine.  Although weight was 60.5 kg today, however that was with clothe on.  I will obtain basic metabolic panel.  I plan to bring the patient back in 4 to 6 weeks for reassessment.  In the meantime, I will obtain repeat echocardiogram to assess her valve and left atrial size.  She is completely asymptomatic.  If the left atrial size is severely enlarged, then likely will pursue rate control therapy in the future.  If left atrial size is normal or mildly enlarged, may still consider outpatient cardioversion.  ROS:   She denies chest pain, palpitations, dyspnea, pnd, orthopnea, n, v, dizziness, syncope, edema, weight gain, or early satiety. All other systems reviewed and are otherwise negative except as noted above.    Studies Reviewed EKG Interpretation Date/Time:  Wednesday June 15 2024 11:29:32 EST Ventricular Rate:  83 PR Interval:    QRS Duration:  96 QT Interval:  384 QTC Calculation: 451 R Axis:   -44  Text Interpretation: Atrial fibrillation T wave inversion in the lateral leads Confirmed by Janene Boer [phone]) on 06/16/2024 8:40:30 PM    Cardiac Studies &  Procedures   ______________________________________________________________________________________________ CARDIAC CATHETERIZATION  CARDIAC CATHETERIZATION 11/12/2021  Conclusion   Ost RCA to Prox RCA lesion is 20% stenosed.   There is no aortic valve stenosis.  Nonischemic Cardiomyopathy Angiographically tortuous but relatively normal coronary arteries with no significant disease. Very tortuous and dilated aorta from abdominal aorta through the  stented thoracic aorta.  Patent right subclavian anastomosis  Essentially normal RHC pressures: RAP 8 mmHg, RVP-EDP 29/0-11 mmHg; PAP-mean 29/13-18 mmHg, PCWP 14 mmHg, LVP-EDP 158/9-16 mmHg, AOP-MAP 156/75-105 mmHg.; Ao sat 93%, PA sat 57%; Cardiac Output-Index (Fick) reduced: 4.23 - 2.49   RECOMMENDATIONS Antihypertensive management; GDMT for NICM-compensated CHF Gentle post-cath hydration but monitor for volume overload.    Alm Clay, MD  Findings Coronary Findings Diagnostic  Dominance: Right  Left Main Vessel was injected. Difficult to engage Vessel is large. Vessel is angiographically normal. Initial engagement was limited AL 2 catheter, but did not fully engage, therefore after placement of a long 6 French sheath, and AL 3 catheter was not able to engage, but an XB LAD 4.0 guide catheter was directed into the left main to complete angiography.  Left Anterior Descending Vessel was injected. Vessel is large. The vessel exhibits minimal luminal irregularities. The vessel is moderately tortuous.  First Diagonal Branch Vessel is small in size.  Second Diagonal Branch Vessel is small in size.  Left Circumflex Vessel was injected. Vessel is normal in caliber. The vessel exhibits minimal luminal irregularities. The vessel is moderately tortuous.  First Obtuse Marginal Branch Vessel is moderate in size.  Right Coronary Artery Vessel was injected. Vessel is normal in caliber. Vessel is angiographically normal. There is mild focal disease in the vessel. The vessel is tortuous. Ost RCA to Prox RCA lesion is 20% stenosed.  Right Ventricular Branch Vessel is small in size.  Intervention  No interventions have been documented.     ECHOCARDIOGRAM  ECHOCARDIOGRAM COMPLETE 05/04/2023  Narrative ECHOCARDIOGRAM REPORT    Patient Name:   Brittany Greene  Date of Exam: 05/04/2023 Medical Rec #:  [id number]     Height:       64.0 in Accession #:    [id number]    Weight:        139.0 lb Date of Birth:  Jan 08, 1941     BSA:          1.676 m Patient Age:    82 years      BP:           130/60 mmHg Patient Gender: F             HR:           60 bpm. Exam Location:  [address]  Procedure: 2D Echo, Cardiac Doppler and Color Doppler  Indications:    I35.1 Aortic Insufficiency  History:        Patient has prior history of Echocardiogram examinations, most recent 04/29/2022. CHF, CAD, PAD, Mitral Valve Disease and Aortic Valve Disease, Signs/Symptoms:Edema and Fatigue; Risk Factors:Family History of Coronary Artery Disease, Hypertension, Dyslipidemia, Former Smoker and Diabetes. 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  Sonographer:    Heather Hawks RDCS Referring Phys: [facility] A CHANDRASEKHAR  IMPRESSIONS   1. Left ventricular ejection fraction, by estimation, is  45 to 50%. The left ventricle has mildly decreased function. The left ventricle demonstrates global hypokinesis. There is mild concentric left ventricular hypertrophy. Left ventricular diastolic parameters are consistent with Grade II diastolic dysfunction (pseudonormalization). 2. Right ventricular systolic function is mildly reduced. The right ventricular size is normal. There is normal pulmonary artery systolic pressure. The estimated right ventricular systolic pressure is 21.1 mmHg. 3. Left atrial size was moderately dilated. 4. Right atrial size was mildly dilated. 5. The mitral valve is normal in structure. Trivial mitral valve regurgitation. No evidence of mitral stenosis. 6. The aortic valve is tricuspid. Aortic valve regurgitation is moderate  to severe. Descending thoracic aorta was poorly imaged, unable to comment on flow reversal. No aortic stenosis is present. 7. Aortic dilatation noted. There is mild dilatation of the aortic root, measuring 40 mm. 8. The inferior vena cava is normal in size with greater than 50% respiratory variability, suggesting right atrial pressure of 3 mmHg.  Comparison(s): Prior EF 30-35%.  FINDINGS Left Ventricle: Left ventricular ejection fraction, by estimation, is 45 to 50%. The left ventricle has mildly decreased function. The left ventricle demonstrates global hypokinesis. The left ventricular internal cavity size was normal in size. There is mild concentric left ventricular hypertrophy. Left ventricular diastolic parameters are consistent with Grade II diastolic dysfunction (pseudonormalization).  Right Ventricle: The right ventricular size is normal. No increase in right ventricular wall thickness. Right ventricular systolic function is mildly reduced. There is normal pulmonary artery systolic pressure. The tricuspid regurgitant velocity is 2.13 m/s, and with an assumed right atrial pressure of 3 mmHg, the estimated right ventricular systolic pressure is 21.1 mmHg.  Left Atrium: Left atrial size was moderately dilated.  Right Atrium: Right atrial size was mildly dilated.  Pericardium: There is no evidence of pericardial effusion.  Mitral Valve: The mitral valve is normal in structure. Trivial mitral valve regurgitation. No evidence of mitral valve stenosis.  Tricuspid Valve: The tricuspid valve is normal in structure. Tricuspid valve regurgitation is trivial.  Aortic Valve: The aortic valve is tricuspid. Aortic valve regurgitation is moderate to severe. Aortic regurgitation PHT measures 540 msec. No aortic stenosis is present.  Pulmonic Valve: The pulmonic valve was normal in structure. Pulmonic valve regurgitation is mild.  Aorta: Aortic dilatation noted. There is mild dilatation of the  aortic root, measuring 40 mm.  Venous: The inferior vena cava is normal in size with greater than 50% respiratory variability, suggesting right atrial pressure of 3 mmHg.  IAS/Shunts: No atrial level shunt detected by color flow Doppler.   LEFT VENTRICLE PLAX 2D LVIDd:         4.83 cm   Diastology LVIDs:         3.30 cm   LV e' medial:    3.59 cm/s LV PW:         0.93 cm   LV E/e' medial:  17.1 LV IVS:        0.93 cm   LV e' lateral:   6.96 cm/s LVOT diam:     2.30 cm   LV E/e' lateral: 8.8 LV SV:         70 LV SV Index:   42 LVOT Area:     4.15 cm   RIGHT VENTRICLE RV Basal diam:  2.80 cm RV S prime:     8.81 cm/s TAPSE (M-mode): 2.1 cm RVSP:           21.1 mmHg  LEFT ATRIUM  Index        RIGHT ATRIUM           Index LA diam:        3.20 cm 1.91 cm/m   RA Pressure: 3.00 mmHg LA Vol (A2C):   63.2 ml 37.71 ml/m  RA Area:     19.00 cm LA Vol (A4C):   85.3 ml 50.89 ml/m  RA Volume:   58.20 ml  34.72 ml/m LA Biplane Vol: 74.6 ml 44.51 ml/m AORTIC VALVE LVOT Vmax:   84.35 cm/s LVOT Vmean:  50.850 cm/s LVOT VTI:    0.168 m AI PHT:      540 msec  AORTA Ao Root diam: 3.90 cm Ao Asc diam:  3.50 cm  MITRAL VALVE               TRICUSPID VALVE MV Area (PHT)  cm         TR Peak grad:   18.1 mmHg MV Decel Time: 213 msec    TR Vmax:        213.00 cm/s MV E velocity: 61.30 cm/s  Estimated RAP:  3.00 mmHg MV A velocity: 51.35 cm/s  RVSP:           21.1 mmHg MV E/A ratio:  1.19 SHUNTS Systemic VTI:  0.17 m Systemic Diam: 2.30 cm  Dalton McleanMD Electronically signed by Ezra Kanner Signature Date/Time: 05/04/2023/1:13:06 PM    Final        CARDIAC MRI  MR CARDIAC MORPHOLOGY W WO CONTRAST 08/07/2023  Narrative CLINICAL DATA:  75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 Echocardiogram 05/04/23 showed EF 45-50%, mild RV dysfunction, moderate to severe AI  EXAM: CARDIAC MRI  TECHNIQUE: The patient  was scanned on a 1.5 Tesla Siemens magnet. A dedicated cardiac coil was used. Functional imaging was done using Fiesta sequences. 2,3, and 4 chamber views were done to assess for RWMA's. Modified Simpson's rule using a short axis stack was used to calculate an ejection fraction on a dedicated work [employer]. The patient received 9 cc of Gadavist . After 10 minutes inversion recovery sequences were used to assess for infiltration and scar tissue. Phase contrast velocity mapping was performed above the aortic and pulmonic valves  CONTRAST:  9 cc  of Gadavist   FINDINGS: Left ventricle:  -Normal size  -Mild hypertrophy  -Low normal systolic function  -Elevated ECV (32%)  -No LGE  LV EF:  53% (Normal 52-79%)  Absolute volumes:  LV EDV: (Normal 78-167 mL)  LV ESV: 74mL (Normal 21-64 mL)  LV SV: 84mL (Normal 52-114 mL)  CO: 4.9L/min (Normal 2.7-6.3 L/min)  Indexed volumes:  LV EDV: 58mL/sq-m (Normal 50-96 mL/sq-m)  LV ESV: 31mL/sq-m (Normal 10-40 mL/sq-m)  LV SV: 10mL/sq-m (Normal 33-64 mL/sq-m)  CI: 2.9L/min/sq-m (Normal 1.9-3.9 L/min/sq-m)  Right ventricle: Normal size and systolic function  RV EF: 53% (Normal 52-80%)  Absolute volumes:  RV EDV: (Normal 79-175 mL)  RV ESV: 51mL (Normal 13-75 mL)  RV SV: 59mL (Normal 56-110 mL)  CO: 3.4L/min (Normal 2.7-6 L/min)  Indexed volumes:  RV EDV: 40mL/sq-m (Normal 51-97 mL/sq-m)  RV ESV: 3mL/sq-m (Normal 9-42 mL/sq-m)  RV SV: 49mL/sq-m (Normal 35-61 mL/sq-m)  CI: 2.0L/min/sq-m (Normal 1.8-3.8 L/min/sq-m)  Left atrium: Moderate enlargement  Right atrium: Mild enlargement  Mitral valve: Trivial regurgitation  Aortic valve: Tricuspid. Moderate regurgitation (regurgitant fraction 20%)  Tricuspid valve: Trivial regurgitation  Pulmonic valve: No regurgitation  Aorta: TAA s/p  repair and TEVAR. Aneurysm of aortic arch measuring 51mm  Pulmonary artery: Main pulmonary  artery aneurysm measuring 50mm  Pericardium: Normal  IMPRESSION: 1. Normal LV size, mild hypertrophy, and low normal systolic function (EF 53%)  2.  Normal RV size and systolic function (EF 53%)  3.  No late gadolinium enhancement to suggest myocardial scar  4. TAA s/p repair and TEVAR. Aneurysm of aortic arch measuring 51mm  5.  Main pulmonary artery aneurysm measuring 50mm  6.  Moderate aortic regurgitation (regurgitant fraction 20%)   Electronically Signed By: Lonni Nanas M.D. On: 08/09/2023 14:58   ______________________________________________________________________________________________      Risk Assessment/Calculations  CHA2DS2-VASc Score = 7   This indicates a 11.2% annual risk of stroke. The patient's score is based upon: CHF History: 1 HTN History: 1 Diabetes History: 1 Stroke History: 0 Vascular Disease History: 1 Age Score: 2 Gender Score: 1            Physical Exam VS:  BP 130/72 (BP Location: Right Arm, Patient Position: Sitting, Cuff Size: Normal)   Pulse 83   Ht 5' 4 (1.626 m)   Wt 133 lb 6.4 oz (60.5 kg)   SpO2 97%   BMI 22.90 kg/m        Wt Readings from Last 3 Encounters:  06/15/24 133 lb 6.4 oz (60.5 kg)  01/10/24 135 lb (61.2 kg)  12/25/22 139 lb (63 kg)    GEN: Well nourished, well developed in no acute distress NECK: No JVD; No carotid bruits CARDIAC: RRR, no murmurs, rubs, gallops RESPIRATORY:  Clear to auscultation without rales, wheezing or rhonchi  ABDOMEN: Soft, non-tender, non-distended EXTREMITIES:  No edema; No deformity   ASSESSMENT AND PLAN  Atrial fibrillation: Unknown duration.  EKG today showed new atrial fibrillation.  Will start the patient on 2.5 mg twice a day of Eliquis .  Heart rate is fairly controlled.  Will obtain basic metabolic panel.  Moderate MR: Repeat echocardiogram.  Nonischemic cardiomyopathy: Baseline EF 45 to 50%.  Pending repeat echocardiogram.  Heart failure regimen including  Entresto , Jardiance , and carvedilol .  History of TEVAR: Follow by [facility] vascular surgery service  Hypertension: Blood pressure well-controlled  Hyperlipidemia: Continue rosuvastatin .       Dispo: Follow-up in 4 to 6 weeks after echocardiogram  Signed, Scot Ford, PA  "

## 2024-06-15 NOTE — Patient Instructions (Signed)
 Medication Instructions:  START ELIQUIS  2.5 MG TWICE A DAY *If you need a refill on your cardiac medications before your next appointment, please call your pharmacy*  Lab Work: BMET TODAY If you have labs (blood work) drawn today and your tests are completely normal, you will receive your results only by: MyChart Message (if you have MyChart) OR A paper copy in the mail If you have any lab test that is abnormal or we need to change your treatment, we will call you to review the results.  Testing/Procedures:[address] Your physician has requested that you have an echocardiogram. Echocardiography is a painless test that uses sound waves to create images of your heart. It provides your doctor with information about the size and shape of your heart and how well your hearts chambers and valves are working. This procedure takes approximately one hour. There are no restrictions for this procedure. Please do NOT wear cologne, perfume, aftershave, or lotions (deodorant is allowed). Please arrive 15 minutes prior to your appointment time.  Please note: We ask at that you not bring children with you during ultrasound (echo/ vascular) testing. Due to room size and safety concerns, children are not allowed in the ultrasound rooms during exams. Our front office staff cannot provide observation of children in our lobby area while testing is being conducted. An adult accompanying a patient to their appointment will only be allowed in the ultrasound room at the discretion of the ultrasound technician under special circumstances. We apologize for any inconvenience.   Follow-Up: At [facility], you and your health needs are our priority.  As part of our continuing mission to provide you with exceptional heart care, our providers are all part of one team.  This team includes your primary Cardiologist (physician) and Advanced Practice Providers or APPs (Physician Assistants and Nurse Practitioners)  who all work together to provide you with the care you need, when you need it.  Your next appointment:   4-6 week(s)  Provider:   Stanly DELENA Leavens, MD or Hao Meng, PA-C (PREFERABLY ON DAY [facility] IS IN OFFICE)   We recommend signing up for the patient portal called MyChart.  Sign up information is provided on this After Visit Summary.  MyChart is used to connect with patients for Virtual Visits (Telemedicine).  Patients are able to view lab/test results, encounter notes, upcoming appointments, etc.  Non-urgent messages can be sent to your provider as well.   To learn more about what you can do with MyChart, go to [email or web address].   Other Instructions

## 2024-06-16 LAB — BASIC METABOLIC PANEL WITH GFR
BUN/Creatinine Ratio: 14 (ref 12–28)
BUN: 19 mg/dL (ref 8–27)
CO2: 20 mmol/L (ref 20–29)
Calcium: 8.9 mg/dL (ref 8.7–10.3)
Chloride: 103 mmol/L (ref 96–106)
Creatinine, Ser: 1.37 mg/dL — ABNORMAL HIGH (ref 0.57–1.00)
Glucose: 90 mg/dL (ref 70–99)
Potassium: 3.2 mmol/L — ABNORMAL LOW (ref 3.5–5.2)
Sodium: 142 mmol/L (ref 134–144)
eGFR: 38 mL/min/1.73 — ABNORMAL LOW

## 2024-06-17 ENCOUNTER — Ambulatory Visit: Payer: Self-pay | Admitting: Physician Assistant

## 2024-06-17 ENCOUNTER — Other Ambulatory Visit: Payer: Self-pay | Admitting: *Deleted

## 2024-06-17 DIAGNOSIS — N6489 Other specified disorders of breast: Secondary | ICD-10-CM

## 2024-06-17 DIAGNOSIS — Z1231 Encounter for screening mammogram for malignant neoplasm of breast: Secondary | ICD-10-CM

## 2024-06-30 ENCOUNTER — Other Ambulatory Visit: Payer: Self-pay | Admitting: *Deleted

## 2024-06-30 ENCOUNTER — Ambulatory Visit
Admission: RE | Admit: 2024-06-30 | Discharge: 2024-06-30 | Disposition: A | Source: Ambulatory Visit | Attending: *Deleted | Admitting: *Deleted

## 2024-06-30 DIAGNOSIS — N6489 Other specified disorders of breast: Secondary | ICD-10-CM

## 2024-06-30 DIAGNOSIS — Z1231 Encounter for screening mammogram for malignant neoplasm of breast: Secondary | ICD-10-CM

## 2024-07-18 ENCOUNTER — Ambulatory Visit (HOSPITAL_COMMUNITY)

## 2024-07-29 ENCOUNTER — Ambulatory Visit: Admitting: Physician Assistant
